# Patient Record
Sex: Female | Born: 1955
Health system: Southern US, Community
[De-identification: ages and names within clinical notes are randomized; demographics above are authoritative.]

## PROBLEM LIST (undated history)

## (undated) DIAGNOSIS — Z8489 Family history of other specified conditions: Secondary | ICD-10-CM

## (undated) DIAGNOSIS — K649 Unspecified hemorrhoids: Secondary | ICD-10-CM

## (undated) DIAGNOSIS — K635 Polyp of colon: Secondary | ICD-10-CM

## (undated) DIAGNOSIS — R011 Cardiac murmur, unspecified: Secondary | ICD-10-CM

## (undated) DIAGNOSIS — M1990 Unspecified osteoarthritis, unspecified site: Secondary | ICD-10-CM

## (undated) DIAGNOSIS — N920 Excessive and frequent menstruation with regular cycle: Secondary | ICD-10-CM

## (undated) DIAGNOSIS — H269 Unspecified cataract: Secondary | ICD-10-CM

## (undated) DIAGNOSIS — Z923 Personal history of irradiation: Secondary | ICD-10-CM

## (undated) DIAGNOSIS — Z9289 Personal history of other medical treatment: Secondary | ICD-10-CM

## (undated) DIAGNOSIS — E785 Hyperlipidemia, unspecified: Secondary | ICD-10-CM

## (undated) DIAGNOSIS — L719 Rosacea, unspecified: Secondary | ICD-10-CM

## (undated) DIAGNOSIS — L732 Hidradenitis suppurativa: Secondary | ICD-10-CM

## (undated) DIAGNOSIS — M479 Spondylosis, unspecified: Secondary | ICD-10-CM

## (undated) DIAGNOSIS — T7840XA Allergy, unspecified, initial encounter: Secondary | ICD-10-CM

## (undated) DIAGNOSIS — D509 Iron deficiency anemia, unspecified: Secondary | ICD-10-CM

## (undated) DIAGNOSIS — F419 Anxiety disorder, unspecified: Secondary | ICD-10-CM

## (undated) DIAGNOSIS — B029 Zoster without complications: Secondary | ICD-10-CM

## (undated) DIAGNOSIS — K08409 Partial loss of teeth, unspecified cause, unspecified class: Secondary | ICD-10-CM

## (undated) DIAGNOSIS — D0510 Intraductal carcinoma in situ of unspecified breast: Secondary | ICD-10-CM

## (undated) HISTORY — DX: Personal history of other medical treatment: Z92.89

## (undated) HISTORY — DX: Polyp of colon: K63.5

## (undated) HISTORY — DX: Unspecified cataract: H26.9

## (undated) HISTORY — DX: Iron deficiency anemia, unspecified: D50.9

## (undated) HISTORY — PX: CATARACT EXTRACTION: SUR2

## (undated) HISTORY — DX: Unspecified osteoarthritis, unspecified site: M19.90

## (undated) HISTORY — DX: Unspecified hemorrhoids: K64.9

## (undated) HISTORY — DX: Hyperlipidemia, unspecified: E78.5

## (undated) HISTORY — PX: JOINT REPLACEMENT: SHX530

## (undated) HISTORY — DX: Spondylosis, unspecified: M47.9

## (undated) HISTORY — DX: Zoster without complications: B02.9

## (undated) HISTORY — DX: Excessive and frequent menstruation with regular cycle: N92.0

## (undated) HISTORY — PX: KNEE SURGERY: SHX244

## (undated) HISTORY — DX: Anxiety disorder, unspecified: F41.9

## (undated) HISTORY — PX: EYE SURGERY: SHX253

## (undated) HISTORY — DX: Allergy, unspecified, initial encounter: T78.40XA

## (undated) HISTORY — DX: Rosacea, unspecified: L71.9

## (undated) HISTORY — PX: COLONOSCOPY: SHX174

## (undated) HISTORY — PX: OTHER SURGICAL HISTORY: SHX169

## (undated) HISTORY — DX: Cardiac murmur, unspecified: R01.1

---

## 1999-06-24 DIAGNOSIS — Z9289 Personal history of other medical treatment: Secondary | ICD-10-CM

## 1999-06-24 HISTORY — DX: Personal history of other medical treatment: Z92.89

## 1999-10-23 ENCOUNTER — Other Ambulatory Visit: Admission: RE | Admit: 1999-10-23 | Discharge: 1999-10-23 | Payer: Self-pay | Admitting: Family Medicine

## 1999-11-07 ENCOUNTER — Encounter: Admission: RE | Admit: 1999-11-07 | Discharge: 1999-11-07 | Payer: Self-pay | Admitting: Family Medicine

## 1999-11-07 ENCOUNTER — Encounter: Payer: Self-pay | Admitting: Family Medicine

## 2000-11-19 ENCOUNTER — Encounter: Payer: Self-pay | Admitting: Family Medicine

## 2000-11-19 ENCOUNTER — Encounter: Admission: RE | Admit: 2000-11-19 | Discharge: 2000-11-19 | Payer: Self-pay | Admitting: Family Medicine

## 2001-01-13 ENCOUNTER — Other Ambulatory Visit: Admission: RE | Admit: 2001-01-13 | Discharge: 2001-01-13 | Payer: Self-pay | Admitting: Family Medicine

## 2001-06-23 DIAGNOSIS — Z9289 Personal history of other medical treatment: Secondary | ICD-10-CM

## 2001-06-23 HISTORY — DX: Personal history of other medical treatment: Z92.89

## 2002-03-23 HISTORY — PX: BREAST CYST ASPIRATION: SHX578

## 2002-04-01 ENCOUNTER — Encounter: Admission: RE | Admit: 2002-04-01 | Discharge: 2002-04-01 | Payer: Self-pay | Admitting: Family Medicine

## 2002-04-01 ENCOUNTER — Encounter: Payer: Self-pay | Admitting: Family Medicine

## 2002-04-15 ENCOUNTER — Other Ambulatory Visit: Admission: RE | Admit: 2002-04-15 | Discharge: 2002-04-15 | Payer: Self-pay | Admitting: Family Medicine

## 2002-11-25 ENCOUNTER — Encounter: Admission: RE | Admit: 2002-11-25 | Discharge: 2002-11-25 | Payer: Self-pay | Admitting: Family Medicine

## 2002-11-25 ENCOUNTER — Encounter: Payer: Self-pay | Admitting: Family Medicine

## 2003-04-18 ENCOUNTER — Other Ambulatory Visit: Admission: RE | Admit: 2003-04-18 | Discharge: 2003-04-18 | Payer: Self-pay | Admitting: Family Medicine

## 2003-04-27 ENCOUNTER — Encounter: Admission: RE | Admit: 2003-04-27 | Discharge: 2003-04-27 | Payer: Self-pay | Admitting: Family Medicine

## 2004-03-23 DIAGNOSIS — Z9289 Personal history of other medical treatment: Secondary | ICD-10-CM

## 2004-03-23 HISTORY — DX: Personal history of other medical treatment: Z92.89

## 2004-03-23 HISTORY — PX: BREAST CYST ASPIRATION: SHX578

## 2004-11-07 ENCOUNTER — Ambulatory Visit: Payer: Self-pay | Admitting: Family Medicine

## 2004-12-30 ENCOUNTER — Encounter: Admission: RE | Admit: 2004-12-30 | Discharge: 2004-12-30 | Payer: Self-pay | Admitting: Family Medicine

## 2005-02-03 ENCOUNTER — Ambulatory Visit: Payer: Self-pay | Admitting: Family Medicine

## 2005-02-03 ENCOUNTER — Other Ambulatory Visit: Admission: RE | Admit: 2005-02-03 | Discharge: 2005-02-03 | Payer: Self-pay | Admitting: Family Medicine

## 2005-02-05 ENCOUNTER — Encounter: Admission: RE | Admit: 2005-02-05 | Discharge: 2005-02-05 | Payer: Self-pay | Admitting: Family Medicine

## 2006-06-19 ENCOUNTER — Ambulatory Visit: Payer: Self-pay | Admitting: Family Medicine

## 2006-07-14 ENCOUNTER — Encounter: Admission: RE | Admit: 2006-07-14 | Discharge: 2006-07-14 | Payer: Self-pay | Admitting: Family Medicine

## 2006-07-14 ENCOUNTER — Ambulatory Visit: Payer: Self-pay | Admitting: Family Medicine

## 2006-07-14 ENCOUNTER — Other Ambulatory Visit: Admission: RE | Admit: 2006-07-14 | Discharge: 2006-07-14 | Payer: Self-pay | Admitting: Family Medicine

## 2006-07-14 ENCOUNTER — Encounter: Payer: Self-pay | Admitting: Family Medicine

## 2006-07-14 LAB — CONVERTED CEMR LAB
ALT: 14 units/L (ref 0–40)
AST: 19 units/L (ref 0–37)
Albumin: 3.7 g/dL (ref 3.5–5.2)
BUN: 14 mg/dL (ref 6–23)
Basophils Absolute: 0.1 10*3/uL (ref 0.0–0.1)
Basophils Relative: 1.4 % — ABNORMAL HIGH (ref 0.0–1.0)
CO2: 28 meq/L (ref 19–32)
Calcium: 9.1 mg/dL (ref 8.4–10.5)
Chloride: 106 meq/L (ref 96–112)
Cholesterol: 186 mg/dL (ref 0–200)
Creatinine, Ser: 1 mg/dL (ref 0.4–1.2)
Eosinophils Relative: 3.2 % (ref 0.0–5.0)
GFR calc Af Amer: 75 mL/min
GFR calc non Af Amer: 62 mL/min
Glucose, Bld: 70 mg/dL (ref 70–99)
HCT: 39 % (ref 36.0–46.0)
HDL: 50 mg/dL (ref >39.0)
Hemoglobin: 13.8 g/dL (ref 12.0–15.0)
LDL Cholesterol: 115 mg/dL — ABNORMAL HIGH (ref 0–99)
Lymphocytes Relative: 23.4 % (ref 12.0–46.0)
MCHC: 35.3 g/dL (ref 30.0–36.0)
MCV: 87.7 fL (ref 78.0–100.0)
Monocytes Absolute: 0.4 10*3/uL (ref 0.2–0.7)
Monocytes Relative: 7.2 % (ref 3.0–11.0)
Neutro Abs: 4 10*3/uL (ref 1.4–7.7)
Neutrophils Relative %: 64.8 % (ref 43.0–77.0)
Pap Smear: NORMAL
Phosphorus: 3.2 mg/dL (ref 2.3–4.6)
Platelets: 209 10*3/uL (ref 150–400)
Potassium: 3.7 meq/L (ref 3.5–5.1)
RBC: 4.44 M/uL (ref 3.87–5.11)
RDW: 13.1 % (ref 11.5–14.6)
Sodium: 140 meq/L (ref 135–145)
TSH: 2.45 microintl units/mL (ref 0.35–5.50)
Total CHOL/HDL Ratio: 3.7
Triglycerides: 106 mg/dL (ref 0–149)
VLDL: 21 mg/dL (ref 0–40)
WBC: 6.1 10*3/uL (ref 4.5–10.5)

## 2006-11-25 ENCOUNTER — Ambulatory Visit: Payer: Self-pay | Admitting: Internal Medicine

## 2006-12-10 ENCOUNTER — Encounter: Payer: Self-pay | Admitting: Internal Medicine

## 2006-12-10 ENCOUNTER — Encounter: Payer: Self-pay | Admitting: Family Medicine

## 2006-12-10 ENCOUNTER — Ambulatory Visit: Payer: Self-pay | Admitting: Internal Medicine

## 2006-12-10 LAB — HM COLONOSCOPY

## 2007-04-11 ENCOUNTER — Encounter: Payer: Self-pay | Admitting: Family Medicine

## 2007-05-24 ENCOUNTER — Encounter: Payer: Self-pay | Admitting: Family Medicine

## 2007-05-24 DIAGNOSIS — L719 Rosacea, unspecified: Secondary | ICD-10-CM

## 2007-05-24 DIAGNOSIS — F411 Generalized anxiety disorder: Secondary | ICD-10-CM | POA: Insufficient documentation

## 2007-05-24 DIAGNOSIS — D509 Iron deficiency anemia, unspecified: Secondary | ICD-10-CM

## 2007-05-24 DIAGNOSIS — N946 Dysmenorrhea, unspecified: Secondary | ICD-10-CM

## 2007-05-24 DIAGNOSIS — J309 Allergic rhinitis, unspecified: Secondary | ICD-10-CM | POA: Insufficient documentation

## 2007-05-24 DIAGNOSIS — R011 Cardiac murmur, unspecified: Secondary | ICD-10-CM | POA: Insufficient documentation

## 2007-05-25 ENCOUNTER — Ambulatory Visit: Payer: Self-pay | Admitting: Family Medicine

## 2007-06-29 ENCOUNTER — Telehealth: Payer: Self-pay | Admitting: Family Medicine

## 2007-07-02 ENCOUNTER — Encounter: Payer: Self-pay | Admitting: Family Medicine

## 2007-07-15 ENCOUNTER — Encounter: Payer: Self-pay | Admitting: Family Medicine

## 2007-08-05 ENCOUNTER — Ambulatory Visit: Payer: Self-pay | Admitting: Specialist

## 2007-09-24 ENCOUNTER — Ambulatory Visit: Payer: Self-pay | Admitting: Specialist

## 2007-09-28 ENCOUNTER — Telehealth: Payer: Self-pay | Admitting: Family Medicine

## 2007-10-20 ENCOUNTER — Ambulatory Visit: Payer: Self-pay | Admitting: Internal Medicine

## 2008-01-07 ENCOUNTER — Other Ambulatory Visit: Admission: RE | Admit: 2008-01-07 | Discharge: 2008-01-07 | Payer: Self-pay | Admitting: Family Medicine

## 2008-01-07 ENCOUNTER — Encounter: Payer: Self-pay | Admitting: Family Medicine

## 2008-01-07 ENCOUNTER — Ambulatory Visit: Payer: Self-pay | Admitting: Family Medicine

## 2008-01-07 LAB — CONVERTED CEMR LAB: Pap Smear: NORMAL

## 2008-01-10 ENCOUNTER — Encounter: Payer: Self-pay | Admitting: Family Medicine

## 2008-01-10 LAB — CONVERTED CEMR LAB
ALT: 13 units/L (ref 0–35)
AST: 20 units/L (ref 0–37)
Albumin: 3.7 g/dL (ref 3.5–5.2)
Alkaline Phosphatase: 51 units/L (ref 39–117)
BUN: 21 mg/dL (ref 6–23)
Basophils Absolute: 0 10*3/uL (ref 0.0–0.1)
Basophils Relative: 0.6 % (ref 0.0–3.0)
Bilirubin, Direct: 0.1 mg/dL (ref 0.0–0.3)
CO2: 27 meq/L (ref 19–32)
Calcium: 9.3 mg/dL (ref 8.4–10.5)
Chloride: 106 meq/L (ref 96–112)
Cholesterol: 184 mg/dL (ref 0–200)
Creatinine, Ser: 0.9 mg/dL (ref 0.4–1.2)
Eosinophils Absolute: 0.2 10*3/uL (ref 0.0–0.7)
Eosinophils Relative: 3.6 % (ref 0.0–5.0)
GFR calc Af Amer: 85 mL/min
GFR calc non Af Amer: 70 mL/min
Glucose, Bld: 95 mg/dL (ref 70–99)
HCT: 38.3 % (ref 36.0–46.0)
HDL: 48.5 mg/dL (ref >39.0)
Hemoglobin: 13.3 g/dL (ref 12.0–15.0)
LDL Cholesterol: 125 mg/dL — ABNORMAL HIGH (ref 0–99)
Lymphocytes Relative: 27.9 % (ref 12.0–46.0)
MCHC: 34.7 g/dL (ref 30.0–36.0)
MCV: 85.7 fL (ref 78.0–100.0)
Monocytes Absolute: 0.4 10*3/uL (ref 0.1–1.0)
Monocytes Relative: 7.1 % (ref 3.0–12.0)
Neutro Abs: 3.3 10*3/uL (ref 1.4–7.7)
Neutrophils Relative %: 60.8 % (ref 43.0–77.0)
Platelets: 199 10*3/uL (ref 150–400)
Potassium: 4.5 meq/L (ref 3.5–5.1)
RBC: 4.47 M/uL (ref 3.87–5.11)
RDW: 12.8 % (ref 11.5–14.6)
Sodium: 137 meq/L (ref 135–145)
TSH: 2.2 microintl units/mL (ref 0.35–5.50)
Total Bilirubin: 1.1 mg/dL (ref 0.3–1.2)
Total CHOL/HDL Ratio: 3.8
Total Protein: 6.6 g/dL (ref 6.0–8.3)
Triglycerides: 53 mg/dL (ref 0–149)
VLDL: 11 mg/dL (ref 0–40)
WBC: 5.4 10*3/uL (ref 4.5–10.5)

## 2008-01-17 ENCOUNTER — Encounter: Admission: RE | Admit: 2008-01-17 | Discharge: 2008-01-17 | Payer: Self-pay | Admitting: Family Medicine

## 2008-02-03 ENCOUNTER — Ambulatory Visit: Payer: Self-pay | Admitting: Obstetrics & Gynecology

## 2008-03-01 ENCOUNTER — Ambulatory Visit (HOSPITAL_COMMUNITY): Admission: RE | Admit: 2008-03-01 | Discharge: 2008-03-01 | Payer: Self-pay | Admitting: Gynecology

## 2008-06-08 ENCOUNTER — Ambulatory Visit: Payer: Self-pay | Admitting: Family Medicine

## 2008-06-08 LAB — CONVERTED CEMR LAB
KOH Prep: NEGATIVE
Whiff Test: NEGATIVE

## 2008-08-24 ENCOUNTER — Ambulatory Visit: Payer: Self-pay | Admitting: Specialist

## 2008-10-13 ENCOUNTER — Ambulatory Visit: Payer: Self-pay | Admitting: Family Medicine

## 2008-10-13 DIAGNOSIS — IMO0002 Reserved for concepts with insufficient information to code with codable children: Secondary | ICD-10-CM

## 2009-02-12 ENCOUNTER — Telehealth: Payer: Self-pay | Admitting: Family Medicine

## 2009-02-28 ENCOUNTER — Ambulatory Visit: Payer: Self-pay | Admitting: Family Medicine

## 2009-03-05 ENCOUNTER — Telehealth: Payer: Self-pay | Admitting: Family Medicine

## 2009-05-02 ENCOUNTER — Ambulatory Visit: Payer: Self-pay | Admitting: Family Medicine

## 2009-06-20 ENCOUNTER — Ambulatory Visit: Payer: Self-pay | Admitting: Family Medicine

## 2009-06-20 DIAGNOSIS — M5020 Other cervical disc displacement, unspecified cervical region: Secondary | ICD-10-CM | POA: Insufficient documentation

## 2009-06-26 ENCOUNTER — Emergency Department: Payer: Self-pay | Admitting: Emergency Medicine

## 2009-06-26 ENCOUNTER — Encounter: Payer: Self-pay | Admitting: Family Medicine

## 2009-07-05 ENCOUNTER — Ambulatory Visit: Payer: Self-pay | Admitting: Family Medicine

## 2009-07-05 DIAGNOSIS — F0781 Postconcussional syndrome: Secondary | ICD-10-CM

## 2009-07-06 ENCOUNTER — Ambulatory Visit: Payer: Self-pay | Admitting: Internal Medicine

## 2009-07-06 ENCOUNTER — Encounter: Admission: RE | Admit: 2009-07-06 | Discharge: 2009-07-06 | Payer: Self-pay | Admitting: Family Medicine

## 2009-07-06 LAB — HM MAMMOGRAPHY: HM Mammogram: NEGATIVE

## 2009-07-09 ENCOUNTER — Telehealth (INDEPENDENT_AMBULATORY_CARE_PROVIDER_SITE_OTHER): Payer: Self-pay | Admitting: *Deleted

## 2009-07-09 ENCOUNTER — Telehealth: Payer: Self-pay | Admitting: Family Medicine

## 2009-07-10 ENCOUNTER — Encounter (INDEPENDENT_AMBULATORY_CARE_PROVIDER_SITE_OTHER): Payer: Self-pay | Admitting: *Deleted

## 2009-07-17 ENCOUNTER — Encounter: Payer: Self-pay | Admitting: Family Medicine

## 2009-07-18 ENCOUNTER — Ambulatory Visit: Payer: Self-pay | Admitting: Family Medicine

## 2009-07-24 ENCOUNTER — Telehealth: Payer: Self-pay | Admitting: Family Medicine

## 2009-07-24 ENCOUNTER — Ambulatory Visit: Payer: Self-pay | Admitting: Otolaryngology

## 2009-07-25 ENCOUNTER — Encounter: Payer: Self-pay | Admitting: Family Medicine

## 2009-07-29 ENCOUNTER — Ambulatory Visit: Payer: Self-pay | Admitting: Family Medicine

## 2009-07-29 ENCOUNTER — Encounter: Payer: Self-pay | Admitting: Family Medicine

## 2009-08-03 ENCOUNTER — Other Ambulatory Visit: Admission: RE | Admit: 2009-08-03 | Discharge: 2009-08-03 | Payer: Self-pay | Admitting: Family Medicine

## 2009-08-03 ENCOUNTER — Encounter (INDEPENDENT_AMBULATORY_CARE_PROVIDER_SITE_OTHER): Payer: Self-pay | Admitting: *Deleted

## 2009-08-03 ENCOUNTER — Ambulatory Visit: Payer: Self-pay | Admitting: Family Medicine

## 2009-08-03 DIAGNOSIS — Z860100 Personal history of colon polyps, unspecified: Secondary | ICD-10-CM | POA: Insufficient documentation

## 2009-08-08 ENCOUNTER — Encounter: Payer: Self-pay | Admitting: Family Medicine

## 2009-08-08 ENCOUNTER — Encounter (INDEPENDENT_AMBULATORY_CARE_PROVIDER_SITE_OTHER): Payer: Self-pay | Admitting: *Deleted

## 2009-08-08 LAB — CONVERTED CEMR LAB
Pap Smear: NEGATIVE
Pap Smear: NORMAL

## 2009-08-09 ENCOUNTER — Encounter (INDEPENDENT_AMBULATORY_CARE_PROVIDER_SITE_OTHER): Payer: Self-pay | Admitting: *Deleted

## 2009-08-24 ENCOUNTER — Encounter: Payer: Self-pay | Admitting: Otolaryngology

## 2009-08-30 ENCOUNTER — Telehealth: Payer: Self-pay | Admitting: Family Medicine

## 2009-08-30 ENCOUNTER — Ambulatory Visit: Payer: Self-pay | Admitting: Internal Medicine

## 2009-09-06 ENCOUNTER — Ambulatory Visit: Payer: Self-pay | Admitting: Family Medicine

## 2009-09-13 ENCOUNTER — Encounter: Payer: Self-pay | Admitting: Family Medicine

## 2009-09-21 LAB — CONVERTED CEMR LAB
ALT: 15 units/L (ref 0–35)
AST: 20 units/L (ref 0–37)
Albumin: 3.8 g/dL (ref 3.5–5.2)
Alkaline Phosphatase: 63 units/L (ref 39–117)
BUN: 13 mg/dL (ref 6–23)
Basophils Absolute: 0 10*3/uL (ref 0.0–0.1)
Basophils Relative: 0.6 % (ref 0.0–3.0)
Bilirubin, Direct: 0.1 mg/dL (ref 0.0–0.3)
CO2: 30 meq/L (ref 19–32)
Calcium: 9.1 mg/dL (ref 8.4–10.5)
Chloride: 100 meq/L (ref 96–112)
Cholesterol: 227 mg/dL — ABNORMAL HIGH (ref 0–200)
Creatinine, Ser: 0.7 mg/dL (ref 0.4–1.2)
Direct LDL: 152.4 mg/dL
Eosinophils Absolute: 0.2 10*3/uL (ref 0.0–0.7)
Eosinophils Relative: 3.3 % (ref 0.0–5.0)
GFR calc non Af Amer: 93.01 mL/min (ref >60)
Glucose, Bld: 83 mg/dL (ref 70–99)
HCT: 40.7 % (ref 36.0–46.0)
HDL: 60.9 mg/dL (ref >39.00)
Hemoglobin: 13.6 g/dL (ref 12.0–15.0)
Lymphocytes Relative: 22.7 % (ref 12.0–46.0)
Lymphs Abs: 1.5 10*3/uL (ref 0.7–4.0)
MCHC: 33.3 g/dL (ref 30.0–36.0)
MCV: 88.3 fL (ref 78.0–100.0)
Monocytes Absolute: 0.4 10*3/uL (ref 0.1–1.0)
Monocytes Relative: 5.7 % (ref 3.0–12.0)
Neutro Abs: 4.6 10*3/uL (ref 1.4–7.7)
Neutrophils Relative %: 67.7 % (ref 43.0–77.0)
Platelets: 202 10*3/uL (ref 150.0–400.0)
Potassium: 4.2 meq/L (ref 3.5–5.1)
RBC: 4.61 M/uL (ref 3.87–5.11)
RDW: 13.8 % (ref 11.5–14.6)
Sodium: 137 meq/L (ref 135–145)
TSH: 2.15 microintl units/mL (ref 0.35–5.50)
Total Bilirubin: 0.8 mg/dL (ref 0.3–1.2)
Total CHOL/HDL Ratio: 4
Total Protein: 7 g/dL (ref 6.0–8.3)
Triglycerides: 123 mg/dL (ref 0.0–149.0)
VLDL: 24.6 mg/dL (ref 0.0–40.0)
WBC: 6.7 10*3/uL (ref 4.5–10.5)

## 2009-10-02 ENCOUNTER — Telehealth: Payer: Self-pay | Admitting: Internal Medicine

## 2009-12-11 ENCOUNTER — Ambulatory Visit: Payer: Self-pay | Admitting: Family Medicine

## 2009-12-11 DIAGNOSIS — E7800 Pure hypercholesterolemia, unspecified: Secondary | ICD-10-CM | POA: Insufficient documentation

## 2009-12-12 LAB — CONVERTED CEMR LAB
ALT: 14 units/L (ref 0–35)
AST: 19 units/L (ref 0–37)
Cholesterol: 220 mg/dL — ABNORMAL HIGH (ref 0–200)
Direct LDL: 150.4 mg/dL
HDL: 65.9 mg/dL (ref >39.00)
Total CHOL/HDL Ratio: 3
Triglycerides: 60 mg/dL (ref 0.0–149.0)
VLDL: 12 mg/dL (ref 0.0–40.0)

## 2009-12-18 ENCOUNTER — Ambulatory Visit: Payer: Self-pay | Admitting: Family Medicine

## 2009-12-31 ENCOUNTER — Telehealth (INDEPENDENT_AMBULATORY_CARE_PROVIDER_SITE_OTHER): Payer: Self-pay | Admitting: *Deleted

## 2010-01-25 ENCOUNTER — Ambulatory Visit: Payer: Self-pay | Admitting: Family Medicine

## 2010-02-04 ENCOUNTER — Ambulatory Visit: Payer: Self-pay | Admitting: Family Medicine

## 2010-02-04 ENCOUNTER — Encounter: Payer: Self-pay | Admitting: Family Medicine

## 2010-02-18 ENCOUNTER — Telehealth: Payer: Self-pay | Admitting: Family Medicine

## 2010-03-20 ENCOUNTER — Ambulatory Visit: Payer: Self-pay | Admitting: Family Medicine

## 2010-03-25 LAB — CONVERTED CEMR LAB
ALT: 20 units/L (ref 0–35)
AST: 20 units/L (ref 0–37)
Cholesterol: 221 mg/dL — ABNORMAL HIGH (ref 0–200)
Direct LDL: 150.6 mg/dL
HDL: 60.5 mg/dL (ref >39.00)
Total CHOL/HDL Ratio: 4
Triglycerides: 69 mg/dL (ref 0.0–149.0)
VLDL: 13.8 mg/dL (ref 0.0–40.0)

## 2010-04-02 ENCOUNTER — Ambulatory Visit: Payer: Self-pay | Admitting: Family Medicine

## 2010-06-27 ENCOUNTER — Ambulatory Visit: Admit: 2010-06-27 | Payer: Self-pay | Admitting: Family Medicine

## 2010-06-27 ENCOUNTER — Ambulatory Visit
Admission: RE | Admit: 2010-06-27 | Discharge: 2010-06-27 | Payer: Self-pay | Source: Home / Self Care | Attending: Family Medicine | Admitting: Family Medicine

## 2010-07-03 ENCOUNTER — Other Ambulatory Visit: Payer: Self-pay | Admitting: Family Medicine

## 2010-07-03 ENCOUNTER — Ambulatory Visit
Admission: RE | Admit: 2010-07-03 | Discharge: 2010-07-03 | Payer: Self-pay | Source: Home / Self Care | Attending: Family Medicine | Admitting: Family Medicine

## 2010-07-03 LAB — LIPID PANEL
Cholesterol: 150 mg/dL (ref 0–200)
HDL: 55.9 mg/dL (ref >39.00)
LDL Cholesterol: 86 mg/dL (ref 0–99)
Total CHOL/HDL Ratio: 3
Triglycerides: 41 mg/dL (ref 0.0–149.0)
VLDL: 8.2 mg/dL (ref 0.0–40.0)

## 2010-07-03 LAB — ALT: ALT: 14 U/L (ref 0–35)

## 2010-07-03 LAB — AST: AST: 19 U/L (ref 0–37)

## 2010-07-23 NOTE — Progress Notes (Signed)
Summary: follow up   Phone Note Call from Patient Call back at Home Phone [phone]   Caller: Patient Call For: Judith Part MD Summary of Call: Patient says that she is scheduled to go back to work on February 7th and she needs a clearance letter. She has a follow up with Dr. Milinda Antis next week.  She would like to come by and pick that up.   Follow-up for Phone Call        Patient is scheduled to see you on the 11th and her form that DR. Copland filled out for her says she is to return back to work on the 7th. She needs a note faxed to her work to excuse her until her follow up with you for clearance to return back to work. It needs to be faxed to [phone] attention Ivan Croft.   I am fine with her returning on the 7th if she wants to (before she sees me on the 11th)-- let me know what she wants to do-- MT  Follow-up by: Melody Comas,  July 24, 2009 3:38 PM  Additional Follow-up for Phone Call Additional follow up Details #1::        Called home number and work number that's listed, no answer and no machine at either number.  Will call  back tomorrow.  Linde Gillis CMA Duncan Dull)  July 24, 2009 4:46 PM   Pt actually wants to go back on 2/14 and she is asking if you will extend her out of work time until then.  She has an appt to see you on 2/11. Additional Follow-up by: Lowella Petties CMA,  July 25, 2009 1:23 PM    Additional Follow-up for Phone Call Additional follow up Details #2::    note done in in box Follow-up by: Judith Part MD,  July 25, 2009 1:45 PM  Additional Follow-up for Phone Call Additional follow up Details #3:: Details for Additional Follow-up Action Taken: Advised pt that note is ready, she will pick up. Additional Follow-up by: Lowella Petties CMA,  July 25, 2009 2:16 PM

## 2010-07-23 NOTE — Progress Notes (Signed)
Summary: call to patient re: hemorrhoids  Phone Note Outgoing Call   Summary of Call: Please contact her for an update re: hemorrhoid signs and symptoms (see last office note) Iva Boop MD, [facility]  October 02, 2009 3:16 PM   Follow-up for Phone Call        Left message for patient to call back Darcey Nora RN, [facility]  October 02, 2009 3:36 PM Left message for patient to call back Darcey Nora RN, [facility]  October 03, 2009 11:29 AM  Left message for patient to call back Darcey Nora RN, [facility]  October 04, 2009 7:38 AM  no return call from the patient  Darcey Nora RN, [facility]  October 05, 2009 8:39 AM
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
## 2010-07-23 NOTE — Assessment & Plan Note (Signed)
Summary: 1 MONTH FOLLOW UP PER DR TOWER/[facility]   Vital Signs:  Patient profile:   55 year old female Height:      68 inches Weight:      193.4 pounds BMI:     29.51 Temp:     98.3 degrees F oral Pulse rate:   84 / minute Pulse rhythm:   regular BP sitting:   110 / 70  (left arm) Cuff size:   regular  Vitals Entered By: Benny Lennert CMA Duncan Dull) (September 06, 2009 4:04 PM)  History of Present Illness: Chief complaint 1 month follow up per tower  55 year old female:  radiculopathy:  neck is still a little sore, no radiculopathy  Back on some mobic.   Has been doing some vestibular rehab [facility] at [facility] - and work on balance and proprioception. Working on her walking. Balance and  [facility]:  Not compliant fully with PT. A couple of weeks ago - and went last week.   Occ. will bring on dizzy spells  Allergies (verified): No Known Drug Allergies  Past History:  Past medical, surgical, family and social histories (including risk factors) reviewed, and no changes noted (except as noted below).  Past Medical History: Reviewed history from 08/30/2009 and no changes required. Allergic rhinitis Anemia-iron deficiency Anxiety heavy menses  rosacea zoster  Arthritis Asthma Colon polyps   Past Surgical History: Reviewed history from 08/30/2009 and no changes required. Caesarean section Left Knee Surgery  Exercise stress test- neg (06/1999) MRI- neg per pt (2003) Breast cyst aspiration (03/2002) Left breast cyst aspirate (11/2002, 12/2004) C-S MRI, C5-6 disk protrusion (03/2004) MRI- right quad fatty defect (2007) Colonoscopy- polyps, hemorrhoids (11/2006)  Family History: Reviewed history from 08/30/2009 and no changes required. Father: deceased age 98- CAD, ETOH, DM  Mother: obesity, OA , thyroid problems  Siblings:  GM with DM sibs with back problems No FH of Colon Cancer:  Social History: Reviewed history from 08/30/2009 and no changes required. Marital  Status: Married Children: 1 daughter Occupation: Film/video editor schools non smoker Alcohol Use - yes: less than one daily  Daily Caffeine Use1-2 daily   Review of Systems      See HPI Eyes:  Complains of blurring. GI:  Complains of nausea. Neuro:  Complains of headaches.  Physical Exam  General:  Well-developed,well-nourished,in no acute distress; alert,appropriate and cooperative throughout examination Head:  Normocephalic and atraumatic without obvious abnormalities. No apparent alopecia or balding. Eyes:  pupils equal, pupils round, pupils reactive to light, and pupils react to accomodation.   Ears:  no external deformities.   Neck:  No deformities, masses, or tenderness noted. Psych:  Cognition and judgment appear intact. Alert and cooperative with normal attention span and concentration. No apparent delusions, illusions, hallucinations   Detailed Neurologic Exam  Speech:    Speech is normal; fluent and spontaneous with normal comprehension Cognition:    The patient is oriented to person, place, and time; memory intact; language fluent; normal attention, concentration, and fund of knowledge Cranial Nerves:    The pupils are equal, round, and reactive to light. The fundi are normal and spontaneous venous pulsations are present. Visual fields are full to finger confrontation. Extraocular movements are intact. Trigeminal sensation is intact and the muscles of mastication are normal. The face is symmetric. The palate elevates in the midline. Voice is normal. Shoulder shrug is normal. The tongue has normal motion without fasciculations.  Coordination:    poor balance with standing on one foot, one foot  in front of the other.  Gait:    Heel-toe and tandem gait are normal.    Impression & Recommendations:  Problem # 1:  DIZZINESS (ICD-780.4) >25 minutes spent in face to face time with patient, >50% spent in counselling or coordination of care: complex case,  the patient has had an  extensive workup,, including imaging of the  cervical spine and lumbar spine  by  MRI, with findings noted,, however this would not relate to her  dizzy sensations and she is continued HA.  She has had CT scans of the brain,, and an MRI of the brain, which showed no abnormalities. Additionally, she is seeing ENT, and had normal tilt table testing.. This would argue against any inner ear process, and Dr. Andee Poles does not think that this is coming from her inner ear. With a normal MRI, I think that a lesion or tumor,  multiple sclerosis, and other pathology such as this  can be excluded.  The patient has a neurology appointment next week, and I think that that is a reasonable step to get their opinion.  Certainly, you can have symptoms postconcussion for greater than 2 months, though it is  less common.  Most likely post-concussive syndrome, but I would like to hear Dr. Zannie Cove opinion as well.  Her updated medication list for this problem includes:    Cetirizine Hcl 10 Mg Tabs (Cetirizine hcl) ..... 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 One tablet daily as needed  Problem # 4:  HERNIATED CERVICAL DISC (ICD-722.0) Assessment: Improved much better  Complete Medication List: 1)  Buspar 15 Mg Tabs (Buspirone hcl) .... One half pill at bedtime 2)  Meloxicam 15 Mg Tabs (Meloxicam) .... One tablet daily as needed 3)  Finacea 15 % Gel (Azelaic acid) .... Topical cream for rosacea on face 4)  Cetirizine Hcl 10 Mg Tabs (Cetirizine hcl) .... One tablet daily as needed 5)  Vitamin D3 400 Unit Chew (Cholecalciferol) .... Chew two daily 6)  Cosamin Ds 500-400 Mg Caps (Glucosamine-chondroitin) .... Two  capsules daily 7)  Fish Oil 1200 Mg Caps (Omega-3 fatty acids) .... Take two capsules daily 8)  Magnesium Oxide 400 Mg Tabs (Magnesium oxide) .... One softgel daily 9)  Multivitamins Tabs (Multiple vitamin) .... One daily 10)  Eql Hemorrhoidal 0.25-3-12-18 % Crea (Phenyleph-shark liv o-glyc-pet) .... As directed 11)  Anusol-hc 25 Mg Supp (Hydrocortisone acetate) .... Insert one suppository into rectum for  7 nights then as needed  Patient Instructions: 1)  discuss all with Dr. Terrace Arabia, review MRI's, review concussive symptoms starting in January  Current Allergies (reviewed today): No known allergies

## 2010-07-23 NOTE — Progress Notes (Signed)
Summary: Buspirone HCL 15mg   Phone Note Refill Request Call back at [phone] Message from:  [facility] on February 18, 2010 12:53 PM  Refills Requested: Medication #1:  BUSPAR 15 MG  TABS one half pill at bedtime [facility] electronically request refill for Buspirone HCL 15mg . No date for last refill.Please advise.    Method Requested: Telephone to Pharmacy Initial call taken by: Lewanda Rife LPN,  February 18, 2010 12:54 PM  Follow-up for Phone Call        px written on EMR for call in  Follow-up by: Judith Part MD,  February 18, 2010 1:05 PM  Additional Follow-up for Phone Call Additional follow up Details #1::        Medication phoned [facility] pharmacy as instructed. Lewanda Rife LPN  February 18, 2010 2:15 PM     New/Updated Medications: BUSPAR 15 MG  TABS (BUSPIRONE HCL) one half pill at bedtime Prescriptions: BUSPAR 15 MG  TABS (BUSPIRONE HCL) one half pill at bedtime  #15 x 11   Entered and Authorized by:   Judith Part MD   Signed by:   Lewanda Rife LPN on 45/40/9811   Method used:   [facility] ...       [facility]  [id number]       [address]       [place], [place]  [address]       Ph: [phone]       Fax: [phone]   RxID:   [id number]

## 2010-07-23 NOTE — Progress Notes (Signed)
  Request Recieved from [employer] for records sent to [facility]  December 31, 2009 8:26 AM

## 2010-07-23 NOTE — Letter (Signed)
Summary: Out of Work  [employer] at [facility]  [address] [place], [place] [address]   Phone: [phone]  Fax: [phone]    July 05, 2009   Employee:  AADHYA BUSTAMANTE    To Whom It May Concern:   For Medical reasons, please excuse the above named employee from work for the following dates:  Start:   07/05/2009  End:   07/12/2009  If you need additional information, please feel free to contact our office.         Sincerely,    Hannah Beat MD

## 2010-07-23 NOTE — Assessment & Plan Note (Signed)
Summary: FOLLOW UP AFTER LABS/RI   Vital Signs:  Patient profile:   55 year old female Height:      68 inches Weight:      199.25 pounds BMI:     30.41 Temp:     98 degrees F oral Pulse rate:   68 / minute Pulse rhythm:   regular BP sitting:   118 / 80  (left arm) Cuff size:   regular  Vitals Entered By: Lewanda Rife LPN (April 02, 2010 4:30 PM) CC: f/u after labs   History of Present Illness: here for f/u of high chol  after better diet LDL stil in 150s hdl good at 60  diet - is healthy  has cut down cheese and eats the low fat variety low to non fat yogurt no red meat at all  almost never fried foods eggs -- with yolks 6 per week most  no shellfish  [place] bacon if any   is ok with taking med if needed for cholesterol  took side eff from red yeast rice     Allergies (verified): No Known Drug Allergies  Past History:  Past Medical History: Last updated: 01/26/2010 Allergic rhinitis Anemia-iron deficiency Anxiety heavy menses  rosacea zoster  Arthritis Asthma Colon polyps  deg lumbar disc dz/ spondylosis  Past Surgical History: Last updated: 08/30/2009 Caesarean section Left Knee Surgery  Exercise stress test- neg (06/1999) MRI- neg per pt (2003) Breast cyst aspiration (03/2002) Left breast cyst aspirate (11/2002, 12/2004) C-S MRI, C5-6 disk protrusion (03/2004) MRI- right quad fatty defect (2007) Colonoscopy- polyps, hemorrhoids (11/2006)  Family History: Last updated: 12-31-09 Father: deceased age 42- CAD, ETOH, DM  Mother: obesity, OA , thyroid problems  sister - high chol GM with DM sibs with back problems No FH of Colon Cancer:  Social History: Last updated: 08/30/2009 Marital Status: Married Children: 1 daughter Occupation: Film/video editor schools non smoker Alcohol Use - yes: less than one daily  Daily Caffeine Use1-2 daily   Risk Factors: Smoking Status: never (05/24/2007)  Review of Systems General:  Denies fatigue, loss  of appetite, and malaise. Eyes:  Denies blurring and eye irritation. CV:  Denies chest pain or discomfort, lightheadness, and palpitations. Resp:  Denies cough and wheezing. GI:  Denies abdominal pain, change in bowel habits, and indigestion. MS:  Denies muscle aches and cramps. Derm:  Denies lesion(s), poor wound healing, and rash. Neuro:  Denies numbness and tingling. Heme:  Denies abnormal bruising and bleeding.  Physical Exam  General:  Well-developed,well-nourished,in no acute distress; alert,appropriate and cooperative throughout examination Head:  normocephalic, atraumatic, and no abnormalities observed.   Mouth:  pharynx pink and moist.   Neck:  supple with full rom and no masses or thyromegally, no JVD or carotid bruit  Lungs:  Normal respiratory effort, chest expands symmetrically. Lungs are clear to auscultation, no crackles or wheezes. Heart:  Normal rate and regular rhythm. S1 and S2 normal without gallop, murmur, click, rub or other extra sounds. no murmur heard today Abdomen:  Bowel sounds positive,abdomen soft and non-tender without masses, organomegaly or hernias noted. no renal bruits  Msk:  No deformity or scoliosis noted of thoracic or lumbar spine.  no acute joint changes  Extremities:  No clubbing, cyanosis, edema, or deformity noted with normal full range of motion of all joints.   Neurologic:  sensation intact to light touch, gait normal, and DTRs symmetrical and normal.   Skin:  Intact without suspicious lesions or rashes Cervical Nodes:  No lymphadenopathy  noted Psych:  normal affect, talkative and pleasant    Impression & Recommendations:  Problem # 1:  HYPERCHOLESTEROLEMIA (ICD-272.0) Assessment Deteriorated  not imp on nearly perfect diet expect this is genetic  will try zocor low dose- disc poss side eff check labs 6 wk  Her updated medication list for this problem includes:    Zocor 20 Mg Tabs (Simvastatin) .Marland Kitchen... 1 by mouth once daily in evening  with a low fat snack  Orders: Prescription Created Electronically [phone])  Complete Medication List: 1)  Buspar 15 Mg Tabs (Buspirone hcl) .... One half pill at bedtime 2)  Meloxicam 15 Mg Tabs (Meloxicam) .... One tablet daily as needed with food 3)  Finacea 15 % Gel (Azelaic acid) .... Topical cream for rosacea on face 4)  Cetirizine Hcl 10 Mg Tabs (Cetirizine hcl) .... One tablet daily as needed 5)  Vitamin D3 400 Unit Chew (Cholecalciferol) .... Chew two daily 6)  Fish Oil 1200 Mg Caps (Omega-3 fatty acids) .... Take two capsules daily 7)  Magnesium Oxide 400 Mg Tabs (Magnesium oxide) .... One softgel daily 8)  Multivitamins Tabs (Multiple vitamin) .... One daily 9)  Anusol-hc 25 Mg Supp (Hydrocortisone acetate) .... Insert one suppository into rectum for  7 nights then as needed 10)  Advil 200 Mg Tabs (Ibuprofen) .... Otc as directed. 11)  Tizanidine Hcl 4 Mg Tabs (Tizanidine hcl) .Marland Kitchen.. 1 by mouth at bedtime as needed muscle spasm 12)  Naproxen ?mg Otc  .... One tablet by mouth every 12 hours 13)  Zocor 20 Mg Tabs (Simvastatin) .Marland Kitchen.. 1 by mouth once daily in evening with a low fat snack  Other Orders: Admin 1st Vaccine (60454) Flu Vaccine 74yrs + (09811)  Patient Instructions: 1)  start zocor (simvastatin) 20 mg daily in evening 2)  update me if any side effects and hold it  3)  continue low fat diet- minimize egg yolks  4)  flu shot today  5)  schedule fasting lab 6 weeks lipid/ast/alt 272  6)  you can raise your HDL (good cholesterol) by increasing exercise and eating omega 3 fatty acid supplement like fish oil or flax seed oil over the counter 7)  you can lower LDL (bad cholesterol) by limiting saturated fats in diet like red meat, fried foods, egg yolks, fatty breakfast meats, high fat dairy products and shellfish  Prescriptions: ZOCOR 20 MG TABS (SIMVASTATIN) 1 by mouth once daily in evening with a low fat snack  #30 x 11   Entered and Authorized by:   Judith Part MD    Signed by:   Judith Part MD on 04/02/2010   Method used:   Electronically to        [id number] (retail)       [address]       [place], [place]  [address]       Ph: [phone]       Fax: [phone]   RxID:   [id number]   Current Allergies (reviewed today): No known allergies    Flu Vaccine Consent Questions     Do you have a history of severe allergic reactions to this vaccine? no    Any prior history of allergic reactions to egg and/or gelatin? no    Do you have a sensitivity to the preservative Thimersol? no    Do you have a past history of Guillan-Barre Syndrome? no    Do you currently have an acute febrile illness? no  Have you ever had a severe reaction to latex? no    Vaccine information given and explained to patient? yes    Are you currently pregnant? no    Lot Number:[id number]   Exp Date:12/21/2010   Site Given  Left Deltoid IMlbflu Lewanda Rife LPN  April 02, 2010 4:36 PM

## 2010-07-23 NOTE — Letter (Signed)
Summary: Results Follow-up Letter  [place] at [facility]  [address] [place], [place] [address]   Phone: [phone]  Fax: [phone]    08/08/2009     [address] [place], [place]  [address]    Dear Ms. Wehrenberg,   The following are the results of your recent test(s):  Test     Result     Pap Smear    Normal____x___  Not Normal_____ Comments: Repeat in one year  _________________________________________________________ Cholesterol LDL(Bad cholesterol):          Your goal is less than:         HDL (Good cholesterol):        Your goal is more than: _________________________________________________________ Other Tests:   _________________________________________________________  Please call for an appointment Or _________________________________________________________ _________________________________________________________ _________________________________________________________  Sincerely,  Roxy Manns MD [place] at [facility]

## 2010-07-23 NOTE — Progress Notes (Signed)
Summary: please review lab results  Phone Note Call from Patient Call back at Home Phone [phone]   Summary of Call: Dr. Milinda Antis, please review lab results from pt's visit last month.  The lab failed to send them to [place].  Printed copy is on your shelf, should be in system soon. Initial call taken by: Lowella Petties CMA,  August 30, 2009 2:13 PM  Follow-up for Phone Call        labs are overall ok but cholesterol went up significantly with LDL nowin 150s I would like it below 130 you can raise your HDL (good cholesterol) by increasing exercise and eating omega 3 fatty acid supplement like fish oil or flax seed oil over the counter you can lower LDL (bad cholesterol) by limiting saturated fats in diet like red meat, fried foods, egg yolks, fatty breakfast meats, high fat dairy products and shellfish  please schedule fasting lab in 3 mo and then follow up to disc lipid/ast/alt 272 Follow-up by: Judith Part MD,  August 30, 2009 7:45 PM  Additional Follow-up for Phone Call Additional follow up Details #1::        Patient notified as instructed by telephone. Fasting  lab appointment scheduled as instructed 12/11/09 at 8:30am and f/u appt with Dr Milinda Antis on 12/18/09 at [facility] LPN  August 31, 2009 10:57 AM

## 2010-07-23 NOTE — Letter (Signed)
Summary: Consult Scheduled [facility] at [facility]  [address] [place], [place] [address]   Phone: [phone]  Fax: [phone]    08/09/2009 MRN: [id number]    Dear Ms. Shoults,      We have scheduled an appointment for you.  At the recommendation of Dr.Tower, we have scheduled you a consult with Dr Terrace Arabia on Wednesday,March 23rd 2011 at 8:00am arrival time.  Their phone number is [phone].  If this appointment day and time is not convenient for you, please feel free to call the office of the doctor you are being referred to at the number listed above and reschedule the appointment.  If you have any questions, please call and speak with Carlton Adam at [phone].   Thank you,  Cyndi Bender at [facility]

## 2010-07-23 NOTE — Letter (Signed)
Summary: Generic Letter  [place] at [facility]  [address] [place], [place] [address]   Phone: [phone]  Fax: [phone]    07/25/2009  [facility] [address] [place], [place]  [address]  To whom it may concern,   My patient Michele Cruz may return to work on 08/06/2009 if she is feeling better .  Sincerely,   Roxy Manns MD
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
## 2010-07-23 NOTE — Assessment & Plan Note (Signed)
Summary: FOLLOW UP AFER LABS/RI   Vital Signs:  Patient profile:   55 year old female Height:      68 inches Weight:      195.25 pounds BMI:     29.79 Temp:     98.4 degrees F oral Pulse rate:   80 / minute Pulse rhythm:   regular BP sitting:   112 / 76  (left arm) Cuff size:   regular  Vitals Entered By: Lewanda Rife LPN (2010-01-14 9:07 AM) CC: f/u after labs   History of Present Illness: here for f/u of lipids after labs   is doing well overall   dizziness is finally getting better- still occasional   still running high with trig 60/ HDL 65-- good, but LDL 150 (down from 152) is doing fairly well with diet overall  no red meat / occas to rare fried foods/ too much high fat dairy -- cheese / no fatty breakfast meats/ shrimp twice per year  father had coronary artery disease  pt does take glucosamine -- cosamin DS   continues to have some low back problems -- PT has helped  is starting to do exercises now   also starting to have some mild hot flashes    Allergies (verified): No Known Drug Allergies  Past History:  Past Medical History: Last updated: 08/30/2009 Allergic rhinitis Anemia-iron deficiency Anxiety heavy menses  rosacea zoster  Arthritis Asthma Colon polyps   Past Surgical History: Last updated: 08/30/2009 Caesarean section Left Knee Surgery  Exercise stress test- neg (06/1999) MRI- neg per pt (2003) Breast cyst aspiration (03/2002) Left breast cyst aspirate (11/2002, 12/2004) C-S MRI, C5-6 disk protrusion (03/2004) MRI- right quad fatty defect (2007) Colonoscopy- polyps, hemorrhoids (11/2006)  Family History: Last updated: 2010-01-14 Father: deceased age 84- CAD, ETOH, DM  Mother: obesity, OA , thyroid problems  sister - high chol GM with DM sibs with back problems No FH of Colon Cancer:  Social History: Last updated: 08/30/2009 Marital Status: Married Children: 1 daughter Occupation: Film/video editor schools non smoker Alcohol  Use - yes: less than one daily  Daily Caffeine Use1-2 daily   Risk Factors: Smoking Status: never (05/24/2007)  Family History: Father: deceased age 61- CAD, ETOH, DM  Mother: obesity, OA , thyroid problems  sister - high chol GM with DM sibs with back problems No FH of Colon Cancer:  Review of Systems General:  Denies fatigue and malaise. Eyes:  Denies blurring and eye irritation. CV:  Denies chest pain or discomfort, lightheadness, and palpitations. Resp:  Denies cough, shortness of breath, and wheezing. GI:  Denies abdominal pain, change in bowel habits, and indigestion. MS:  Denies muscle aches and cramps. Derm:  Denies lesion(s), poor wound healing, and rash. Neuro:  Denies headaches. Endo:  Denies cold intolerance and heat intolerance. Heme:  Denies abnormal bruising and bleeding.  Physical Exam  General:  Well-developed,well-nourished,in no acute distress; alert,appropriate and cooperative throughout examination Head:  Normocephalic and atraumatic without obvious abnormalities.  Eyes:  pupils equal, pupils round, pupils reactive to light, and pupils react to accomodation.   Neck:  supple with full rom and no masses or thyromegally, no JVD or carotid bruit  Lungs:  Normal respiratory effort, chest expands symmetrically. Lungs are clear to auscultation, no crackles or wheezes. Heart:  Normal rate and regular rhythm. S1 and S2 normal without gallop, murmur, click, rub or other extra sounds. no murmur heard today Msk:  No deformity or scoliosis noted of thoracic or lumbar  spine.   Pulses:  R and L carotid,radial,femoral,dorsalis pedis and posterior tibial pulses are full and equal bilaterally Extremities:  No clubbing, cyanosis, edema, or deformity noted with normal full range of motion of all joints.   Neurologic:  gait normal and DTRs symmetrical and normal.   Skin:  Intact without suspicious lesions or rashes Cervical Nodes:  No lymphadenopathy noted Psych:  normal  affect, talkative and pleasant    Impression & Recommendations:  Problem # 1:  HYPERCHOLESTEROLEMIA (ICD-272.0) Assessment Deteriorated  disc this in detail today in light of fam hx of coronary artery disease - would like LDL under 130 disc low sat fat diet in detail  suggest avoiding high fat dairy also stop glucosamine - which can raise chol re check in 3 mo - if not imp - will consider statin   Labs Reviewed: SGOT: 19 (12/11/2009)   SGPT: 14 (12/11/2009)   HDL:65.90 (12/11/2009), 60.90 (08/03/2009)  LDL:125 (01/07/2008), 115 (07/14/2006)  Chol:220 (12/11/2009), 227 (08/03/2009)  Trig:60.0 (12/11/2009), 123.0 (08/03/2009)  Complete Medication List: 1)  Buspar 15 Mg Tabs (Buspirone hcl) .... One half pill at bedtime 2)  Meloxicam 15 Mg Tabs (Meloxicam) .... One tablet daily as needed 3)  Finacea 15 % Gel (Azelaic acid) .... Topical cream for rosacea on face 4)  Cetirizine Hcl 10 Mg Tabs (Cetirizine hcl) .... One tablet daily as needed 5)  Vitamin D3 400 Unit Chew (Cholecalciferol) .... Chew two daily 6)  Fish Oil 1200 Mg Caps (Omega-3 fatty acids) .... Take two capsules daily 7)  Magnesium Oxide 400 Mg Tabs (Magnesium oxide) .... One softgel daily 8)  Multivitamins Tabs (Multiple vitamin) .... One daily 9)  Anusol-hc 25 Mg Supp (Hydrocortisone acetate) .... Insert one suppository into rectum for  7 nights then as needed 10)  Advil 200 Mg Tabs (Ibuprofen) .... Otc as directed.  Patient Instructions: 1)  you can raise your HDL (good cholesterol) by increasing exercise and eating omega 3 fatty acid supplement like fish oil or flax seed oil over the counter 2)  you can lower LDL (bad cholesterol) by limiting saturated fats in diet like red meat, fried foods, egg yolks, fatty breakfast meats, high fat dairy products and shellfish  3)  stop the cosamin DS-- if joint pain gets bad , - let me know  4)  schedule fasting labs in 3 months lipid/ast/alt 272  5)  if not improved - would  consider statin chol med   Current Allergies (reviewed today): No known allergies
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
## 2010-07-23 NOTE — Miscellaneous (Signed)
Summary: mammogram results  Clinical Lists Changes  Observations: Added new observation of MAMMO DUE: 07/2010 (07/10/2009 8:10) Added new observation of MAMMOGRAM: normal (07/10/2009 8:10)      Preventive Care Screening  Mammogram:    Date:  07/10/2009    Next Due:  07/2010    Results:  normal

## 2010-07-23 NOTE — Progress Notes (Signed)
Summary: Letter for employer  Phone Note Call from Patient Call back at Home Phone [phone]   Caller: Patient Call For: Dr. Patsy Lager Summary of Call: Calling to see if the letter to her employer is ready to be picked up yet? Initial call taken by: Delilah Shan CMA Duncan Dull),  July 09, 2009 4:20 PM  Follow-up for Phone Call        will call and have patient come in sooner as Dr copland advised.Consuello Masse CMA  Follow-up by: Benny Lennert CMA Duncan Dull),  July 10, 2009 8:02 AM

## 2010-07-23 NOTE — Letter (Signed)
Summary: New Patient letter  [facility]  [address] [place], [place] [address]   Phone: [phone]  Fax: [phone]       08/03/2009 MRN: [id number]  [facility] [address] [place], [place]  [address]  Dear Ms. Walko,  Welcome to the Gastroenterology Division at [facility].    You are scheduled to see Dr. Leone Payor on 08/30/2009 at 3:00PM on the 3rd floor at [facility], [address]  We ask that you try to arrive at our office 15 minutes prior to your appointment time to allow for check-in.  We would like you to complete the enclosed self-administered evaluation form prior to your visit and bring it with you on the day of your appointment.  We will review it with you.  Also, please bring a complete list of all your medications or, if you prefer, bring the medication bottles and we will list them.  Please bring your insurance card so that we may make a copy of it.  If your insurance requires a referral to see a specialist, please bring your referral form from your primary care physician.  Co-payments are due at the time of your visit and may be paid by cash, check or credit card.     Your office visit will consist of a consult with your physician (includes a physical exam), any laboratory testing he/she may order, scheduling of any necessary diagnostic testing (e.g. x-ray, ultrasound, CT-scan), and scheduling of a procedure (e.g. Endoscopy, Colonoscopy) if required.  Please allow enough time on your schedule to allow for any/all of these possibilities.    If you cannot keep your appointment, please call [phone] to cancel or reschedule prior to your appointment date.  This allows [place] the opportunity to schedule an appointment for another patient in need of care.  If you do not cancel or reschedule by 5 p.m. the business day prior to your appointment date, you will be charged a $50.00 late cancellation/no-show fee.    Thank you for choosing  [facility] for your medical needs.  We appreciate the opportunity to care for you.  Please visit [place] at our website  to learn more about our practice.                     Sincerely,                                                             The Gastroenterology Division

## 2010-07-23 NOTE — Progress Notes (Signed)
Summary: pt is asking for lab results  Phone Note Call from Patient Call back at Home Phone [phone] Call back at [phone]   Caller: Patient Call For: Terri Summary of Call: Terri, this pt had labs done at her physical last month but there are no results in her chart.  Can you look into this? Initial call taken by: Lowella Petties CMA,  August 30, 2009 12:41 PM  Follow-up for Phone Call        Terri called lab and results were in their system but had not crossed over to ours. It is now in patients chart. Natasha Chavers CMA Duncan Dull)  August 30, 2009 1:21 PM

## 2010-07-23 NOTE — Assessment & Plan Note (Signed)
Summary: follow up concussion/hmw   Vital Signs:  Patient profile:   55 year old female Height:      68 inches Weight:      189.4 pounds BMI:     28.90 Temp:     98.2 degrees F oral Pulse rate:   88 / minute Pulse rhythm:   regular BP sitting:   110 / 70  (left arm) Cuff size:   regular  Vitals Entered By: Benny Lennert CMA Duncan Dull) (July 18, 2009 4:02 PM)  History of Present Illness: Chief complaint follow up concussion  55 year old:  Still feeling dizzy. Having some nausea. Stopped all pain killers at this point. Globally, however, she is feeling better compared to her last office visit. CT of head negative  A little twinge of some headache, but this is dramatically improved  Still has some parasthesias, as previously in her upper extremitiesl. Some HA that has loosened up a lot.   Went to ENT on her own at [facility], and she saw Dr. Andee Poles given her ongoing dizziness. He felt she may have a component of BPV and wanted to get a brain MRI.  MRI will be done in [place], [facility]  Allergies (verified): No Known Drug Allergies  Past History:  Past medical, surgical, family and social histories (including risk factors) reviewed, and no changes noted (except as noted below).  Past Medical History: Reviewed history from 01/07/2008 and no changes required. Allergic rhinitis Anemia-iron deficiency Anxiety heavy menses  rosacea zoster   Past Surgical History: Reviewed history from 05/24/2007 and no changes required. Caesarean section Exercise stress test- neg (06/1999) MRI- neg per pt (2003) Breast cyst aspiration (03/2002) Left breast cyst aspirate (11/2002, 12/2004) C-S MRI, C5-6 disk protrusion (03/2004) MRI- right quad fatty defect (2007) Colonoscopy- polyps, hemorrhoids (11/2006)  Family History: Reviewed history from 01/07/2008 and no changes required. Father: deceased age 54- CAD, ETOH, DM  Mother: obesity, OA , thyroid problems  Siblings:  GM with  DM sibs with back problems  Social History: Reviewed history from 01/07/2008 and no changes required. 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 Eyes:  Denies blurring. ENT:  Denies ringing in ears. MS:  See HPI. Neuro:  See HPI; Complains of difficulty with concentration, sensation of room spinning, and tingling; denies numbness, seizures, visual disturbances, and weakness; balance impoved compared to last exam. Psych:  Denies anxiety and depression.  Physical Exam  General:  Well-developed,well-nourished,in no acute distress; alert,appropriate and cooperative throughout examination Head:  Normocephalic and atraumatic without obvious abnormalities. No apparent alopecia or balding. Eyes:  vision grossly intact, pupils equal, pupils round, pupils reactive to light, pupils react to accomodation, and corneas and lenses clear.   Ears:  no external deformities.   Nose:  no external deformity.   Neck:  No deformities, masses, or tenderness noted. Lungs:  normal respiratory effort.   Extremities:  No clubbing, cyanosis, edema Psych:  Cognition and judgment appear intact. Alert and cooperative with normal attention span and concentration. No apparent delusions, illusions, hallucinations   Detailed Neurologic Exam  Speech:    Speech is normal; fluent and spontaneous with normal comprehension Cognition:    The patient is oriented to person, place, and time; memory intact; language fluent; normal attention, concentration, and fund of knowledge Cranial Nerves:    The pupils are equal, round, and reactive  to light. The fundi are normal and spontaneous venous pulsations are present. Visual fields are full to finger confrontation. Extraocular movements are intact. Trigeminal sensation is intact and the muscles of mastication are normal. The face is symmetric. The  palate elevates in the midline. Voice is normal. Shoulder shrug is normal. The tongue has normal motion without fasciculations.  Coordination:    Normal finger to nose and heel to shin. Normal rapid alternating movements.   RHOMBERG NORMAL ONE FOOT IN FRONT OF THE OTHER BALANCING, MUCH IMPROVED Gait:    Heel-toe and tandem gait are normal.  Trapezius:    No tightness or tenderness noted.  Observation:    No asymmetry, no atrophy, and no involuntary movements noted.   Tone:    Normal muscle tone.  Posture:    Posture is normal.  Strength:    Strength is V/V in the upper and lower limbs.  Light Touch:    Normal light touch sensation in upper and lower extremities.    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 Balance disturbance improved.   Continue with complete cognitive and physical rest  Extend work note  This patient is postconcussive. BPV is theoretically possible, but constellation of symptoms consistent with resolving acute concussion with postconsussive syndrome  Problem # 2:  NECK PAIN, ACUTE (ICD-723.1) improving   Orders: Radiology Referral (Radiology)  Problem # 3:  HERNIATED CERVICAL DISC (ICD-722.0) cervical radiculopathy continues, will obtain MRI of the cervical spine at the same time as MRI brain  Orders: Radiology Referral (Radiology)  Complete Medication List: 1)  Buspar 15 Mg Tabs (Buspirone hcl) .... One half pill at bedtime  Patient Instructions: 1)  Referral Appointment Information 2)  Day/Date: 3)  Time: 4)  Place/MD: 5)  Address: 6)  Phone/Fax: 7)  Patient given appointment information. Information/Orders faxed/mailed.  8)  and follow-up with Dr. Milinda Antis  Current Allergies (reviewed today): No known allergies
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
## 2010-07-23 NOTE — Letter (Signed)
Summary: Results Follow up Letter  [place] [facility]  [address] [place], [place] [address]   Phone: [phone]  Fax: [phone]    08/08/2009 MRN: [id number]    [facility] [address] [place], [place]  [address]    Dear Ms. Test,  The following are the results of your recent test(s):  Test         Result    Pap Smear:        Normal ___X__  Not Normal _____ Comments:Please repeat in one year. ______________________________________________________ Cholesterol: LDL(Bad cholesterol):         Your goal is less than:         HDL (Good cholesterol):       Your goal is more than: Comments:  ______________________________________________________ Mammogram:        Normal _____  Not Normal _____ Comments:  ___________________________________________________________________ Hemoccult:        Normal _____  Not normal _______ Comments:    _____________________________________________________________________ Other Tests:    We routinely do not discuss normal results over the telephone.  If you desire a copy of the results, or you have any questions about this information we can discuss them at your next office visit.   Sincerely,    Idamae Schuller Tower,MD  MT/ri

## 2010-07-23 NOTE — Progress Notes (Signed)
Summary: Med & Problem List Brought by Patient  Med & Problem List Brought by Patient   Imported By: Lanelle Bal 08/08/2009 11:53:46  _____________________________________________________________________  External Attachment:    Type:   Image     Comment:   External Document

## 2010-07-23 NOTE — Consult Note (Signed)
Summary: [facility]  [facility]   Imported By: Lanelle Bal 09/18/2009 14:08:23  _____________________________________________________________________  External Attachment:    Type:   Image     Comment:   External Document

## 2010-07-23 NOTE — Miscellaneous (Signed)
  Clinical Lists Changes  Observations: Added new observation of LAST PAP DAT: 07-2009 (08/08/2009 10:35) Added new observation of PAP SMEAR: nORMAL  (08/08/2009 10:35)

## 2010-07-23 NOTE — Consult Note (Signed)
Summary: [facility]  [facility]   Imported By: Lanelle Bal 08/17/2009 09:17:16  _____________________________________________________________________  External Attachment:    Type:   Image     Comment:   External Document

## 2010-07-23 NOTE — Assessment & Plan Note (Signed)
Summary: back pain/alc   Vital Signs:  Patient profile:   55 year old female Height:      68 inches Weight:      199.75 pounds BMI:     30.48 Temp:     97.9 degrees F oral Pulse rate:   68 / minute Pulse rhythm:   regular BP sitting:   118 / 70  (left arm) Cuff size:   regular  Vitals Entered By: Linde Gillis CMA Duncan Dull) (January 25, 2010 12:09 PM) CC: back pain   History of Present Illness: thinks she overdid it in the garden  2-3 wk ago - twisted and pulled a wheelbarrow  now pain is going into her R leg and foot - getting worse and waking her up at night thinks never been imaged  some weak feeling -- with pain all the way down to foot no numbness  taking naproxen  no mobic left     sees chiropractor-- for stretching also  saw Dr Patsy Lager in past    Allergies (verified): No Known Drug Allergies  Past History:  Past Medical History: Last updated: 08/30/2009 Allergic rhinitis Anemia-iron deficiency Anxiety heavy menses  rosacea zoster  Arthritis Asthma Colon polyps   Past Surgical History: Last updated: 08/30/2009 Caesarean section Left Knee Surgery  Exercise stress test- neg (06/1999) MRI- neg per pt (2003) Breast cyst aspiration (03/2002) Left breast cyst aspirate (11/2002, 12/2004) C-S MRI, C5-6 disk protrusion (03/2004) MRI- right quad fatty defect (2007) Colonoscopy- polyps, hemorrhoids (11/2006)  Family History: Last updated: 15-Jan-2010 Father: deceased age 42- CAD, ETOH, DM  Mother: obesity, OA , thyroid problems  sister - high chol GM with DM sibs with back problems No FH of Colon Cancer:  Social History: Last updated: 08/30/2009 Marital Status: Married Children: 1 daughter Occupation: Film/video editor schools non smoker Alcohol Use - yes: less than one daily  Daily Caffeine Use1-2 daily   Risk Factors: Smoking Status: never (05/24/2007)  Review of Systems General:  Denies fatigue, fever, loss of appetite, and malaise. Eyes:   Denies blurring and eye irritation. CV:  Denies chest pain or discomfort and palpitations. Resp:  Denies cough and shortness of breath. GI:  Denies abdominal pain, nausea, and vomiting. GU:  Denies dysuria, hematuria, and urinary frequency. MS:  Complains of low back pain and stiffness; denies cramps. Derm:  Denies itching, lesion(s), poor wound healing, and rash. Neuro:  Denies numbness and tingling. Endo:  Denies excessive urination. Heme:  Denies abnormal bruising and bleeding.  Physical Exam  General:  Well-developed,well-nourished,in no acute distress; alert,appropriate and cooperative throughout examination Head:  Normocephalic and atraumatic without obvious abnormalities.  Eyes:  vision grossly intact, pupils equal, pupils round, and pupils reactive to light.   Mouth:  pharynx pink and moist.   Neck:  nl rom  Lungs:  Normal respiratory effort, chest expands symmetrically. Lungs are clear to auscultation, no crackles or wheezes. Heart:  Normal rate and regular rhythm. S1 and S2 normal without gallop, murmur, click, rub or other extra sounds. no murmur heard today Abdomen:  no suprapubic tenderness or fullness felt  Msk:  tender LS L4-L5 mild mildly tender in R buttock also no troch tenderness neg SLR nl rom hips  full flex pain on ext pain on R lat flex Extremities:  No clubbing, cyanosis, edema, or deformity noted with normal full range of motion of all joints.   Neurologic:  strength normal in all extremities, sensation intact to light touch, gait normal, and DTRs symmetrical and  normal.   Skin:  Intact without suspicious lesions or rashes Inguinal Nodes:  No significant adenopathy Psych:  normal affect, talkative and pleasant    Impression & Recommendations:  Problem # 1:  BACK PAIN, LUMBAR, WITH RADICULOPATHY (ICD-724.4) Assessment Deteriorated this is worse after strain/ sprain will change to mobic  continue heat / stretch x ray today - update  look at disc  spaces - may consisder mri vs PT dep on the result also refilled muscle relaxer for night time Her updated medication list for this problem includes:    Meloxicam 15 Mg Tabs (Meloxicam) ..... One tablet daily as needed with food    Advil 200 Mg Tabs (Ibuprofen) ..... Otc as directed.    Tizanidine Hcl 4 Mg Tabs (Tizanidine hcl) .Marland Kitchen... 1 by mouth at bedtime as needed muscle spasm  Orders: T-Lumbar Spine 2 Views (72100TC) Prescription Created Electronically [id number])  Complete Medication List: 1)  Buspar 15 Mg Tabs (Buspirone hcl) .... One half pill at bedtime 2)  Meloxicam 15 Mg Tabs (Meloxicam) .... One tablet daily as needed with food 3)  Finacea 15 % Gel (Azelaic acid) .... Topical cream for rosacea on face 4)  Cetirizine Hcl 10 Mg Tabs (Cetirizine hcl) .... One tablet daily as needed 5)  Vitamin D3 400 Unit Chew (Cholecalciferol) .... Chew two daily 6)  Fish Oil 1200 Mg Caps (Omega-3 fatty acids) .... Take two capsules daily 7)  Magnesium Oxide 400 Mg Tabs (Magnesium oxide) .... One softgel daily 8)  Multivitamins Tabs (Multiple vitamin) .... One daily 9)  Anusol-hc 25 Mg Supp (Hydrocortisone acetate) .... Insert one suppository into rectum for  7 nights then as needed 10)  Advil 200 Mg Tabs (Ibuprofen) .... Otc as directed. 11)  Tizanidine Hcl 4 Mg Tabs (Tizanidine hcl) .Marland Kitchen.. 1 by mouth at bedtime as needed muscle spasm  Patient Instructions: 1)  use mobic and muscle relaxer as needed 2)  x rays today 3)  heat and stretching  4)  update me if worse  5)  will call when results return with a plan Prescriptions: TIZANIDINE HCL 4 MG TABS (TIZANIDINE HCL) 1 by mouth at bedtime as needed muscle spasm  #30 x 0   Entered and Authorized by:   Judith Part MD   Signed by:   Judith Part MD on 01/25/2010   Method used:   Electronically to        [id number]       [address]       [place], [place]  [address]       Ph: [phone]       Fax: [phone]    RxID:   [id number] MELOXICAM 15 MG TABS (MELOXICAM) one tablet daily as needed with food  #30 x 3   Entered and Authorized by:   Judith Part MD   Signed by:   Judith Part MD on 01/25/2010   Method used:   Electronically to        [id number]       [address]       [place], [place]  [address]       Ph: [phone]       Fax: [phone]   RxID:   [id number]   Current Allergies (reviewed today): No known allergies

## 2010-07-25 NOTE — Assessment & Plan Note (Signed)
Summary: DRY COUGH/JRR   Vital Signs:  Patient profile:   55 year old female Weight:      193.50 pounds Temp:     98.7 degrees F oral Pulse rate:   84 / minute Pulse rhythm:   regular BP sitting:   100 / 70  (left arm) Cuff size:   regular  Vitals Entered By: Sydell Axon LPN (June 27, 2010 3:30 PM) CC: Dry cough for a couple of days   History of Present Illness: Pt here for hacking cough for a few days that is keeping her awake. She denies fever but has had some chills, some headache in the eye area but was dilated yesterday. She has had ear itching, no pain, no rhinitis, no ST, cough that is reasonablyt nonproductive. No N/V. She has taken Delsym and DM.  Problems Prior to Update: 1)  Hypercholesterolemia  (ICD-272.0) 2)  Hemorrhoids, With Bleeding  (ICD-455.8) 3)  Colonic Polyps, Hx of  (ICD-V12.72) 4)  Postconcussion Syndrome  (ICD-310.2) 5)  Herniated Cervical Disc  (ICD-722.0) 6)  Back Pain, Lumbar, With Radiculopathy  (ICD-724.4) 7)  Menorrhalgia  (ICD-625.3) 8)  Dysmenorrhea  (ICD-625.3) 9)  Rosacea  (ICD-695.3) 10)  Cardiac Murmur  (ICD-785.2) 11)  Anxiety  (ICD-300.00) 12)  Anemia-iron Deficiency  (ICD-280.9) 13)  Allergic Rhinitis  (ICD-477.9)  Medications Prior to Update: 1)  Buspar 15 Mg  Tabs (Buspirone Hcl) .... One Half Pill At Bedtime 2)  Meloxicam 15 Mg Tabs (Meloxicam) .... One Tablet Daily As Needed With Food 3)  Finacea 15 % Gel (Azelaic Acid) .... Topical Cream For Rosacea On Face 4)  Cetirizine Hcl 10 Mg Tabs (Cetirizine Hcl) .... One Tablet Daily As Needed 5)  Vitamin D3 400 Unit Chew (Cholecalciferol) .... Chew Two Daily 6)  Fish Oil 1200 Mg Caps (Omega-3 Fatty Acids) .... Take Two Capsules Daily 7)  Magnesium Oxide 400 Mg Tabs (Magnesium Oxide) .... One Softgel Daily 8)  Multivitamins   Tabs (Multiple Vitamin) .... One Daily 9)  Anusol-Hc 25 Mg Supp (Hydrocortisone Acetate) .... Insert One Suppository Into Rectum For  7 Nights Then As  Needed 10)  Advil 200 Mg Tabs (Ibuprofen) .... Otc As Directed. 11)  Tizanidine Hcl 4 Mg Tabs (Tizanidine Hcl) .Marland Kitchen.. 1 By Mouth At Bedtime As Needed Muscle Spasm 12)  Naproxen ?mg Otc .... One Tablet By Mouth Every 12 Hours 13)  Zocor 20 Mg Tabs (Simvastatin) .Marland Kitchen.. 1 By Mouth Once Daily in Evening With A Low Fat Snack  Allergies: No Known Drug Allergies  Physical Exam  General:  Well-developed,well-nourished,in no acute distress; alert,appropriate and cooperative throughout examination. Head:  Normocephalic, atraumatic, and no abnormalities observed.  Sinuses NT. Eyes:  Conjunctiva clear bilaterally.  Ears:  External ear exam shows no significant lesions or deformities.  Otoscopic examination reveals clear canals, tympanic membranes are intact bilaterally without bulging, retraction, inflammation or discharge. Hearing is grossly normal bilaterally. Nose:  External nasal examination shows no deformity or inflammation. Nasal mucosa are pink and moist without lesions or exudates. Mouth:  Oral mucosa and oropharynx without lesions or exudates.  Teeth in good repair. No PND seen. Neck:  No deformities, masses, or tenderness noted. Lungs:  Normal respiratory effort, chest expands symmetrically. Lungs are clear to auscultation, no crackles or wheezes. Good air flow.   Impression & Recommendations:  Problem # 1:  COUGH (ICD-786.2) Assessment New See instructions. Long time spent discussing URI sxs and symptomatic trmt.  Complete Medication List: 1)  Buspar 15 Mg  Tabs (Buspirone hcl) .... One half pill at bedtime 2)  Meloxicam 15 Mg Tabs (Meloxicam) .... One tablet daily as needed with food 3)  Finacea 15 % Gel (Azelaic acid) .... Topical cream for rosacea on face 4)  Cetirizine Hcl 10 Mg Tabs (Cetirizine hcl) .... One tablet daily as needed 5)  Vitamin D3 400 Unit Chew (Cholecalciferol) .... Chew two daily 6)  Fish Oil 1200 Mg Caps (Omega-3 fatty acids) .... Take two capsules daily 7)   Magnesium Oxide 400 Mg Tabs (Magnesium oxide) .... One softgel daily 8)  Multivitamins Tabs (Multiple vitamin) .... One daily 9)  Anusol-hc 25 Mg Supp (Hydrocortisone acetate) .... Insert one suppository into rectum for  7 nights then as needed 10)  Advil 200 Mg Tabs (Ibuprofen) .... Otc as directed. 11)  Tizanidine Hcl 4 Mg Tabs (Tizanidine hcl) .Marland Kitchen.. 1 by mouth at bedtime as needed muscle spasm 12)  Naproxen ?mg Otc  .... One tablet by mouth every 12 hours 13)  Zocor 20 Mg Tabs (Simvastatin) .Marland Kitchen.. 1 by mouth once daily in evening with a low fat snack 14)  Tussionex Pennkinetic Er 10-8 Mg/80ml Lqcr (Hydrocod polst-chlorphen polst) .... One tsp by mouth at bedtime if needed for cough.  Patient Instructions: 1)  Take Guaifenesin by [facility] or [facility] and getting MUCOUS RELIEF EXPECTORANT (400mg ), take 11/2 tabs by mouth AM and NOON. 2)  Drink lots of fluids anytime taking Guaifenesin.  3)  Take Tussionex at night as needed.  4)  Keep lozenge in mouth.  5)  RTC/call for worsening sxs as discussed. Prescriptions: TUSSIONEX PENNKINETIC ER 10-8 MG/5ML LQCR (HYDROCOD POLST-CHLORPHEN POLST) one tsp by mouth at bedtime if needed for cough.  #4 oz x 0   Entered and Authorized by:   Shaune Leeks MD   Signed by:   Shaune Leeks MD on 06/27/2010   Method used:   Print then Give to Patient   RxID:   [id number]    Orders Added: 1)  Est. Patient Level III [65784]    Current Allergies (reviewed today): No known allergies

## 2010-11-05 NOTE — Assessment & Plan Note (Signed)
NAME:  Michele Cruz, Michele Cruz              ACCOUNT NO.:  [id number]   MEDICAL RECORD NO.:  [id number]          PATIENT TYPE:  POB   LOCATION:  [facility] at [facility]         FACILITY:  [facility]   PHYSICIAN:  Elsie Lincoln, MD      DATE OF BIRTH:  28-Jun-1955   DATE OF SERVICE:  02/03/2008                                  CLINIC NOTE   The patient is a 55 year old G2, para 1-0-1-1, LMP on January 17, 2008, who  presents for mild cramping and was found to have a small anterior  fibroid 2.3 x 1.8 x 2.7 and 2 small ovarian cysts that were about 2 cm  in the right ovary, but were not qualified as to complex or simple.  The  patient is mostly here because of the presence of this and not that she  is having that much pain.  She does have heavy periods and does not know  if she is anemic or not.  CBC was drawn a couple of weeks ago at Dr.  Royden Purl office and we will get those results.  The patient has very  regular cycles and they are getting slightly shorter, they are heavy  with moderate pain.  We did discuss taking naproxen around the clock at  the very beginning of the period to help decrease flow and decrease  pain.  She uses a vasectomy for contraception.   OBSTETRICAL HISTORY:  C-section x1.   GYN HISTORY:  No history of abnormal Pap smears or sexually transmitted  diseases.  Fibroid and ovarian cyst as above.   SURGICAL HISTORY:  C-section x1 and arthroscopic knee surgery in 2009.   MEDICAL HISTORY:  Osteoarthritis, heart murmur that is questionably from  a ASD or VSD that has now developed.  She has asthma, borderline high  cholesterol, and anxiety disorder.   SOCIAL HISTORY:  The patient lives with her husband and daughter and  drinks 2 caffeinated beverages a day.  She is a social drinker.  No  tobacco or drugs.  No history of sexual abuse.   Systemic review is positive for fatigue and again she did have a TSH  drawn and we will get a copy of that sent to [place].   HEALTHCARE MAINTENANCE:  The  patient had a colonoscopy in 2008, and it  was found to be normal other than 2 small hemorrhoids.  Pap smear and  mammogram are up-to-date per the patient done by Dr. Milinda Antis.   MEDICATIONS:  BuSpar, Finacea, loratadine, naproxen, multivitamin, fish  oil, calcium, and glucosamine.   ALLERGIES:  Denies.   PHYSICAL EXAMINATION:  GENERAL:  Well nourished, well developed, in no  apparent distress.  VITAL SIGNS:  Pulse 81, blood pressure 138/83, weight 193, and height 5  feet 7-1/2 inches.  CHEST:  Clear.  ABDOMEN:  Soft and nontender.  No organomegaly.  No hernia.  GENITALIA:  There is a seborrheic keratosis on the right portion of her  mons pubis.  She has Tanner 5 hair pattern.  Vagina is pink with normal  rugae.  Bladder nontender.  Urethra nontender and good support.  No  rectocele.  Cervix closed.  Uterus feels about 12 cm, 12-weeks  sized,  and not painful on palpation, but more crampy.  No ovarian cysts or  masses can be felt on bimanual.  EXTREMITIES:  Nontender.  No edema.   ASSESSMENT AND PLAN:  A 55 year old female with small anterior fibroid  and 2 ovarian cysts.  1. Followup ultrasound in 4 weeks.  2. TSH and CBC.  3. Could possibly address heavy menses if she is anemic with a Mirena,      __________and not start birth control pills.  4. The patient is to come back in 4-5 weeks for results.           ______________________________  Elsie Lincoln, MD     KL/MEDQ  D:  02/03/2008  T:  02/03/2008  Job:  [id number]

## 2010-12-04 ENCOUNTER — Ambulatory Visit (INDEPENDENT_AMBULATORY_CARE_PROVIDER_SITE_OTHER): Payer: BC Managed Care – PPO | Admitting: Family Medicine

## 2010-12-04 ENCOUNTER — Encounter: Payer: Self-pay | Admitting: Family Medicine

## 2010-12-04 VITALS — BP 108/78 | HR 76 | Temp 99.2°F | Wt 196.8 lb

## 2010-12-04 DIAGNOSIS — J069 Acute upper respiratory infection, unspecified: Secondary | ICD-10-CM

## 2010-12-04 MED ORDER — AMOXICILLIN 875 MG PO TABS: 875.0000 mg | ORAL_TABLET | Freq: Two times a day (BID) | ORAL | Status: AC

## 2010-12-04 MED ORDER — FLUTICASONE PROPIONATE 50 MCG/ACT NA SUSP: 2.0000 | Freq: Every day | NASAL | Status: DC

## 2010-12-04 NOTE — Assessment & Plan Note (Addendum)
Viral ddx d/w pt.  Hold abx as she is nontoxic and 4-5 days in.  Start after 1 week if not better.  flonase and supportive tx in meantime.  Fu prn.  She agrees.

## 2010-12-04 NOTE — Patient Instructions (Signed)
Use the flonase daily.  Drink plenty of fluids, take tylenol as needed, and start the amoxil if not better by the weekend.  This should gradually improve.  Take care.  Let us know if you have other concerns.

## 2010-12-04 NOTE — Progress Notes (Signed)
duration of symptoms: started with sneezing and joint aches about 4-5 days ago. Tooth pain and sinus pressure, cough.  Rhinorrhea: yes congestion:yes ear pain: no pain but congested  sore throat:no Cough:yes, occ Myalgias: no but B knee pain, chest sore from cough other concerns: trouble sleeping with cough.  Works in the school system.   Mild elevation in temp noted.   No rash.   ROS: See HPI.  Otherwise negative.    Meds, vitals, and allergies reviewed.   GEN: nad, alert and oriented HEENT: mucous membranes moist, TM w/o erythema, nasal epithelium injected, OP with cobblestoning, frontal and max sinus not ttp NECK: supple w/o LA CV: rrr. PULM: ctab, no inc wob ABD: soft, +bs EXT: no edema

## 2011-01-28 ENCOUNTER — Ambulatory Visit (INDEPENDENT_AMBULATORY_CARE_PROVIDER_SITE_OTHER): Payer: BC Managed Care – PPO | Admitting: Family Medicine

## 2011-01-28 ENCOUNTER — Encounter: Payer: Self-pay | Admitting: Family Medicine

## 2011-01-28 VITALS — BP 120/72 | HR 72 | Temp 98.4°F | Ht 67.5 in | Wt 201.0 lb

## 2011-01-28 DIAGNOSIS — B029 Zoster without complications: Secondary | ICD-10-CM | POA: Insufficient documentation

## 2011-01-28 DIAGNOSIS — M25569 Pain in unspecified knee: Secondary | ICD-10-CM

## 2011-01-28 DIAGNOSIS — M25561 Pain in right knee: Secondary | ICD-10-CM

## 2011-01-28 MED ORDER — VALACYCLOVIR HCL 1 G PO TABS: 1000.0000 mg | ORAL_TABLET | Freq: Three times a day (TID) | ORAL | Status: DC

## 2011-01-28 NOTE — Progress Notes (Signed)
  Subjective:    Patient ID: Michele Cruz, female    DOB: 06-23-1956, 55 y.o.   MRN: [id number]  HPI  Shingles Was really stressed out yesterday.  Michele Cruz also started to have an acute rash outbreak on her left side, some few lesions, elevated. Few have been scratched. Some with pain in and around that region. No known exposures.   Patient presents with approx 7 day h/o R sided knee pain after no occult injury.  The patient has had an effusion. No symptomatic giving-way. + mechanical clicking. Joint has not locked up. Patient has been able to walk. Prior MRI on R knee showed a meniscal tear.   Pain location: medial and lateral Current physical activity: Bracing: neoprene sleve Occupation or school level:  The PMH, PSH, Social History, Family History, Medications, and allergies have been reviewed in [facility], and have been updated if relevant.  Review of Systems ROS: GEN: Acute illness details above GI: Tolerating PO intake GU: maintaining adequate hydration and urination Pulm: No SOB Interactive and getting along well at home.  Otherwise, ROS is as per the HPI.     Objective:   Physical Exam   Physical Exam  Blood pressure 120/72, pulse 72, temperature 98.4 F (36.9 C), temperature source Oral, height 5' 7.5" (1.715 m), weight 201 lb (91.173 kg), SpO2 99.00%.  GEN: Well-developed,well-nourished,in no acute distress; alert,appropriate and cooperative throughout examination HEENT: Normocephalic and atraumatic without obvious abnormalities. Ears, externally no deformities PULM: Breathing comfortably in no respiratory distress EXT: No clubbing, cyanosis, or edema PSYCH: Normally interactive. Cooperative during the interview. Pleasant. Friendly and conversant. Not anxious or depressed appearing. Normal, full affect. Skin: elevated scattered vesicular lesions left posterior shoulder blade area, scattered, some going down to thorax  Gait: Normal heel toe pattern Effusion: L  moderate Echymosis or edema: none Patellar tendon NT Patellar grind: pos NOTABLE CREPITUS B Medial and lateral patellar facet loading: negative medial and lateral joint lines: mild TTP medial and lateral B Mcmurray's neg Flexion-pinch neg Varus and valgus stress: stable Lachman: neg Ant and Post drawer: neg Hamstring concentric and eccentric: 5/5       Assessment & Plan:   1. Shingles  valACYclovir (VALTREX) 1000 MG tablet  2. Knee pain, right     Acute shingles. Reviewed care  Probable OA flare. Cont naprosyn for now and ice. F/u and can reassess if continues after acute illness.

## 2011-03-05 ENCOUNTER — Other Ambulatory Visit: Payer: Self-pay

## 2011-03-05 NOTE — Telephone Encounter (Signed)
[facility] faxed refill request Buspirone 15 mg. Last filled 12/05/10.Please advise.

## 2011-03-06 ENCOUNTER — Other Ambulatory Visit: Payer: Self-pay | Admitting: *Deleted

## 2011-03-06 MED ORDER — BUSPIRONE HCL 15 MG PO TABS: 7.5000 mg | ORAL_TABLET | Freq: Every day | ORAL | Status: DC

## 2011-03-06 NOTE — Telephone Encounter (Signed)
Will refill electronically  

## 2011-03-25 ENCOUNTER — Telehealth: Payer: Self-pay | Admitting: Family Medicine

## 2011-03-25 DIAGNOSIS — D509 Iron deficiency anemia, unspecified: Secondary | ICD-10-CM

## 2011-03-25 DIAGNOSIS — E7800 Pure hypercholesterolemia, unspecified: Secondary | ICD-10-CM

## 2011-03-25 DIAGNOSIS — Z0000 Encounter for general adult medical examination without abnormal findings: Secondary | ICD-10-CM

## 2011-03-25 NOTE — Telephone Encounter (Signed)
Message copied by Judy Pimple on Tue Mar 25, 2011  8:41 PM ------      Message from: Alvina Chou      Created: Tue Mar 25, 2011 11:09 AM      Regarding: Labs for 10-4 Thursday       Patient is scheduled for CPX labs, please order future labs, Thanks , Camelia Eng

## 2011-03-27 ENCOUNTER — Other Ambulatory Visit (INDEPENDENT_AMBULATORY_CARE_PROVIDER_SITE_OTHER): Payer: BC Managed Care – PPO

## 2011-03-27 ENCOUNTER — Ambulatory Visit (INDEPENDENT_AMBULATORY_CARE_PROVIDER_SITE_OTHER): Payer: BC Managed Care – PPO

## 2011-03-27 DIAGNOSIS — Z23 Encounter for immunization: Secondary | ICD-10-CM

## 2011-03-27 DIAGNOSIS — D509 Iron deficiency anemia, unspecified: Secondary | ICD-10-CM

## 2011-03-27 DIAGNOSIS — E7800 Pure hypercholesterolemia, unspecified: Secondary | ICD-10-CM

## 2011-03-27 DIAGNOSIS — Z0000 Encounter for general adult medical examination without abnormal findings: Secondary | ICD-10-CM

## 2011-03-27 LAB — COMPREHENSIVE METABOLIC PANEL
ALT: 17 U/L (ref 0–35)
Albumin: 4.2 g/dL (ref 3.5–5.2)
CO2: 27 mEq/L (ref 19–32)
Calcium: 9.5 mg/dL (ref 8.4–10.5)
Chloride: 107 mEq/L (ref 96–112)
GFR: 86.7 mL/min (ref >60.00)
Glucose, Bld: 98 mg/dL (ref 70–99)
Sodium: 141 mEq/L (ref 135–145)
Total Bilirubin: 1.2 mg/dL (ref 0.3–1.2)
Total Protein: 6.9 g/dL (ref 6.0–8.3)

## 2011-03-27 LAB — CBC WITH DIFFERENTIAL/PLATELET
Eosinophils Relative: 6.1 % — ABNORMAL HIGH (ref 0.0–5.0)
HCT: 41.8 % (ref 36.0–46.0)
Hemoglobin: 14 g/dL (ref 12.0–15.0)
Lymphocytes Relative: 35.4 % (ref 12.0–46.0)
Lymphs Abs: 1.5 10*3/uL (ref 0.7–4.0)
Monocytes Relative: 7.6 % (ref 3.0–12.0)
Neutro Abs: 2.1 10*3/uL (ref 1.4–7.7)
Platelets: 170 10*3/uL (ref 150.0–400.0)
WBC: 4.2 10*3/uL — ABNORMAL LOW (ref 4.5–10.5)

## 2011-03-27 LAB — LDL CHOLESTEROL, DIRECT: Direct LDL: 152.4 mg/dL

## 2011-03-27 LAB — LIPID PANEL
Cholesterol: 219 mg/dL — ABNORMAL HIGH (ref 0–200)
VLDL: 9.8 mg/dL (ref 0.0–40.0)

## 2011-04-07 ENCOUNTER — Ambulatory Visit (INDEPENDENT_AMBULATORY_CARE_PROVIDER_SITE_OTHER): Payer: BC Managed Care – PPO | Admitting: Family Medicine

## 2011-04-07 ENCOUNTER — Encounter: Payer: Self-pay | Admitting: Family Medicine

## 2011-04-07 VITALS — BP 114/68 | HR 68 | Temp 98.7°F | Ht 67.0 in | Wt 199.5 lb

## 2011-04-07 DIAGNOSIS — N946 Dysmenorrhea, unspecified: Secondary | ICD-10-CM

## 2011-04-07 DIAGNOSIS — M25569 Pain in unspecified knee: Secondary | ICD-10-CM

## 2011-04-07 DIAGNOSIS — Z0000 Encounter for general adult medical examination without abnormal findings: Secondary | ICD-10-CM

## 2011-04-07 DIAGNOSIS — Z1231 Encounter for screening mammogram for malignant neoplasm of breast: Secondary | ICD-10-CM

## 2011-04-07 DIAGNOSIS — T148 Other injury of unspecified body region: Secondary | ICD-10-CM

## 2011-04-07 DIAGNOSIS — E7800 Pure hypercholesterolemia, unspecified: Secondary | ICD-10-CM

## 2011-04-07 DIAGNOSIS — T148XXA Other injury of unspecified body region, initial encounter: Secondary | ICD-10-CM

## 2011-04-07 DIAGNOSIS — W57XXXA Bitten or stung by nonvenomous insect and other nonvenomous arthropods, initial encounter: Secondary | ICD-10-CM | POA: Insufficient documentation

## 2011-04-07 MED ORDER — ATORVASTATIN CALCIUM 10 MG PO TABS: 10.0000 mg | ORAL_TABLET | Freq: Every day | ORAL | Status: DC

## 2011-04-07 NOTE — Progress Notes (Signed)
Subjective:    Patient ID: Michele Cruz, female    DOB: [birth year], 55 y.o.   MRN: [id number]  HPI Here for annual health mt exam and to review chronic medical problems and to disc knee pain  Had a stressful year and summer Got over her shingles quickly  Has had it 3 times in that area    114/68- good bp  Wt is down 3 lb with bmi of 31 Is eating less in general  Not exercising - knees give her a really hard time   Is wondering if she could have had lyme dz in past - gets deer ticks all the time  Had a rash about 9 months ago and did get a big red blotch with a bite in the past  Has had knee problems in past - saw Dr Katrinka Blazing over the summer -- OA  Told her that she would need knee repl in the past  Also had Dr Patsy Lager look at her knees   Lipids are fairly stable with LDL in 150s Non tol of simvastatin in the past- gave her memory issues , not aches and pains  Lab Results  Component Value Date   CHOL 219* 03/27/2011   CHOL 150 07/03/2010   CHOL 221* 03/20/2010   Lab Results  Component Value Date   HDL 62.10 03/27/2011   HDL 40.98 07/03/2010   HDL 11.91 03/20/2010   Lab Results  Component Value Date   LDLCALC 86 07/03/2010   LDLCALC 125* 01/07/2008   LDLCALC 115* 07/14/2006   Lab Results  Component Value Date   TRIG 49.0 03/27/2011   TRIG 41.0 07/03/2010   TRIG 69.0 03/20/2010   Lab Results  Component Value Date   CHOLHDL 4 03/27/2011   CHOLHDL 3 07/03/2010   CHOLHDL 4 03/20/2010   Lab Results  Component Value Date   LDLDIRECT 152.4 03/27/2011   LDLDIRECT 150.6 03/20/2010   LDLDIRECT 150.4 12/11/2009     Diet= is really good - knows what she cannot eat Is open to other statins - to try    Mam 1/11 ok  Needs to set that up - breast center  Self exam- no lumps or changes   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 HYPERCHOLESTEROLEMIA  . ANEMIA-IRON DEFICIENCY  . ANXIETY  . POSTCONCUSSION SYNDROME  . HEMORRHOIDS, WITH BLEEDING  . ALLERGIC RHINITIS  . Dysmenorrhea  . ROSACEA  . HERNIATED CERVICAL DISC  . BACK PAIN, LUMBAR, WITH RADICULOPATHY  . CARDIAC MURMUR  . COLONIC POLYPS, HX OF  . Shingles  . Routine general medical examination at a health care facility  . Knee pain  . Tick bites  . Other screening mammogram   Past Medical History  Diagnosis Date  . Allergic rhinitis   . Anemia, iron deficiency   . Anxiety   . Heavy menses   . Rosacea   . Zoster   . Arthritis   . Asthma   . Colon polyps   . Spondylosis     deg lumbar disc dz  . History of exercise stress test 06/1999    neg  . History of MRI 2003    neg per patient  . History  of MRI of spine 03/2004    C-5, C5-6 disk protrusion  . History of MRI 2007    right quad fatty defect   Past Surgical History  Procedure Date  . Cesarean section   . Knee surgery     left  . Breast cyst aspiration 03/2002  . Breast cyst aspiration 03/2004    left   History  Substance Use Topics  . Smoking status: Never Smoker   . Smokeless tobacco: Never Used  . Alcohol Use: Yes     occassionally   Family History  Problem Relation Age of Onset  . Arthritis Mother     OA  . Obesity Mother   . Heart disease Father     CAD  . Alcohol abuse Father   . Diabetes Father   . Hyperlipidemia Sister   . Diabetes Other    Allergies  Allergen Reactions  . Meloxicam     Pt felt dizzy potentially from this, but does well on aleve  . Simvastatin     Possible transient memory changes, resolved off medicine   Current Outpatient Prescriptions on File Prior to Visit  Medication Sig Dispense Refill  . aspirin 81 MG tablet Take 81 mg by mouth daily.        . Azelaic Acid (FINACEA) 15 % cream Topical cream for rosacea on face, use as directed       .  busPIRone (BUSPAR) 15 MG tablet Take 0.5 tablets (7.5 mg total) by mouth at bedtime. Take 1/2 by mouth at bedtime  15 tablet  11  . magnesium oxide (MAG-OX) 400 MG tablet Take 400 mg by mouth daily.        . Multiple Vitamin (MULTIVITAMIN) tablet Take 1 tablet by mouth daily.        . Naproxen (NAPROSYN PO) OTC; take one by mouth every 12 hours       . Omega-3 Fatty Acids (FISH OIL) 1200 MG CAPS Take two by mouth daily       . valACYclovir (VALTREX) 1000 MG tablet Take 1 tablet (1,000 mg total) by mouth 3 (three) times daily.  21 tablet  0         Review of Systems Review of Systems  Constitutional: Negative for fever, appetite change, fatigue and unexpected weight change.  Eyes: Negative for pain and visual disturbance.  Respiratory: Negative for cough and shortness of breath.   Cardiovascular: Negative for cp or palpitations    Gastrointestinal: Negative for nausea, diarrhea and constipation.  Genitourinary: Negative for urgency and frequency.  Skin: Negative for pallor or rash   MSK pos for knee pain  Neurological: Negative for weakness, light-headedness, numbness and headaches.  Hematological: Negative for adenopathy. Does not bruise/bleed easily.  Psychiatric/Behavioral: Negative for dysphoric mood. The patient is not nervous/anxious.          Objective:   Physical Exam  Constitutional: She appears well-developed and well-nourished. No distress.  HENT:  Head: Normocephalic and atraumatic.  Right Ear: External ear normal.  Left Ear: External ear normal.  Nose: Nose normal.  Mouth/Throat: Oropharynx is clear and moist.  Eyes: Conjunctivae and EOM are normal. Pupils are equal, round, and reactive to light. No scleral icterus.  Neck: Normal range of motion. Neck supple. No JVD present. Carotid bruit is not present. No thyromegaly present.  Cardiovascular: Normal rate, regular rhythm, normal heart sounds and intact distal pulses.   Pulmonary/Chest: Effort normal and breath  sounds normal. No respiratory distress. She has no  wheezes.  Abdominal: Soft. Bowel sounds are normal. She exhibits no distension and no mass. There is no tenderness.  Genitourinary: No breast swelling, tenderness, discharge or bleeding.  Musculoskeletal: Normal range of motion. She exhibits tenderness. She exhibits no edema.       bilat knee crepitice and joint line tenderness  No joint swelling   Lymphadenopathy:    She has no cervical adenopathy.  Neurological: She is alert. She has normal reflexes. No cranial nerve deficit. Coordination normal.  Skin: Skin is warm and dry. No rash noted. No erythema. No pallor.  Psychiatric: She has a normal mood and affect.          Assessment & Plan:

## 2011-04-07 NOTE — Assessment & Plan Note (Signed)
Lipids still high- intol of zocor - will try lipitor Adv to call and stop it if side eff Rev low sat fat diet  Lab in 6 weeks

## 2011-04-07 NOTE — Assessment & Plan Note (Signed)
Pt had several tick bites this season and in light of a few large local rxn and rash and current worse knee pain - wants to check antibodies -- will test that today

## 2011-04-07 NOTE — Assessment & Plan Note (Signed)
Nl breast exam Mam scheduled  Disc imp of self breast exams

## 2011-04-07 NOTE — Assessment & Plan Note (Signed)
Reviewed health habits including diet and exercise and skin cancer prevention Also reviewed health mt list, fam hx and immunizations  Rev wellness labs in detail 

## 2011-04-07 NOTE — Assessment & Plan Note (Signed)
Pt has known OA and is interested in tx options such as injections - not ready for knee repl Will fu with Dr Patsy Lager

## 2011-04-07 NOTE — Patient Instructions (Signed)
Consider making an appt with Dr Patsy Lager for your knee pain  Will check lyme disease lab today Try lipitor for cholesterol - if any side effects stop it and call  Schedule fasting lab in 6 weeks  We will refer you for a mammogram at check out

## 2011-04-08 LAB — B. BURGDORFI ANTIBODIES: B burgdorferi Ab IgG+IgM: 0.13 {ISR}

## 2011-05-13 ENCOUNTER — Telehealth: Payer: Self-pay | Admitting: *Deleted

## 2011-05-13 NOTE — Telephone Encounter (Signed)
Ok - stop it and update me in 2 weeks with how pain is -thanks Please add to list for med intolerance and take off med list

## 2011-05-13 NOTE — Telephone Encounter (Signed)
FYI pt states atorvastatin is not working for her , she c/o leg cramps and pain and will stop taking it as of today.

## 2011-05-13 NOTE — Telephone Encounter (Signed)
Patient notified as instructed by telephone. Pt said she can already tell pain is virtually gone. Med list updated as well as med intolerance.

## 2011-05-19 ENCOUNTER — Other Ambulatory Visit: Payer: BC Managed Care – PPO

## 2011-08-20 ENCOUNTER — Encounter: Payer: Self-pay | Admitting: Family Medicine

## 2011-08-20 ENCOUNTER — Ambulatory Visit (INDEPENDENT_AMBULATORY_CARE_PROVIDER_SITE_OTHER): Payer: BC Managed Care – PPO | Admitting: Family Medicine

## 2011-08-20 DIAGNOSIS — M1712 Unilateral primary osteoarthritis, left knee: Secondary | ICD-10-CM

## 2011-08-20 DIAGNOSIS — M25569 Pain in unspecified knee: Secondary | ICD-10-CM

## 2011-08-20 NOTE — Patient Instructions (Signed)
OSTEOARTHRITIS:  For symptomatic relief: Tylenol: 2 tablets up to 3-4 times a day Regular NSAIDS are helpful (avoid in kidney disease and ulcers) Topical Capzaicin Cream, as needed (wear glove to put on) Topical Voltaren (NSAID) Gel can help   For flares, corticosteroid injections help. Hyaluronic Acid injections have good success, average relief is 6 months  Glucosamine and Chondroitin often helpful - will take about 3 months to see if you have an effect. If you do, great, keep them up, if none at that point, no need to take in the future.  Omega-3 fish oils may help, 2 grams daily  Ice joints on bad days, 20 min, 2-3 x / day REGULAR EXERCISE: swimming, Yoga, Tai Chi, bicycle (NON-IMPACT activity)  

## 2011-08-20 NOTE — Progress Notes (Signed)
Patient Name: Michele Cruz Date of Birth: 1955/09/17 Age: 56 y.o. Medical Record Number: [id number] Gender: female Date of Encounter: 08/20/2011  History of Present Illness:  Michele Cruz is a 56 y.o. very pleasant female patient who presents with the following:  Saw Dr. Reita Chard, s/p partial menisectomy on the LEFT, and everything was basically OK.  And then over the summer, both knees had been getting pregressively weaker. Walking but not doing well. Bone on bone, both knees - per Dr. Michaelle Copas report to her, but I do not have the original films to review myself.  Not bending knees  If walking too much, will throb and ache like a tooth ache.   Naproxen 220 bid. She's not doing that much every day.  Very rare Tylenol. She does have pain going down stairs and with walking and uneven surfaces. She has decreased her physical activity over the last year or so. No mechanical buckling. No locking up. No giving way of her joint. She has used knee braces which has helped some with support.   Past Medical History, Surgical History, Social History, Family History, Problem List, Medications, and Allergies have been reviewed and updated if relevant.  Review of Systems:  GEN: No fevers, chills. Nontoxic. Primarily MSK c/o today. MSK: Detailed in the HPI GI: tolerating PO intake without difficulty Neuro: No numbness, parasthesias, or tingling associated. Otherwise the pertinent positives of the ROS are noted above.    Physical Examination: Filed Vitals:   08/20/11 1541  BP: 110/72  Pulse: 64  Temp: 98.2 F (36.8 C)  TempSrc: Oral  Height: 5' 7.5" (1.715 m)  Weight: 202 lb 12.8 oz (91.989 kg)  SpO2: 99%    Body mass index is 31.29 kg/(m^2).   GEN: WDWN, NAD, Non-toxic, Alert & Oriented x 3 HEENT: Atraumatic, Normocephalic.  Ears and Nose: No external deformity. EXTR: No clubbing/cyanosis/edema NEURO: Normal gait.  PSYCH: Normally interactive. Conversant. Not depressed or  anxious appearing.  Calm demeanor.   Knee:  B Gait: Normal heel toe pattern ROM: loss of 2 deg ext to 120 B Effusion: neg Echymosis or edema: none Patellar tendon NT Painful PLICA: neg Patellar grind: negative Medial and lateral patellar facet loading: negative medial and lateral joint lines: mild ttp medially and laterally B Mcmurray's neg Flexion-pinch mod pain Varus and valgus stress: stable Lachman: neg Ant and Post drawer: neg Hip abduction, IR, ER: WNL Hip flexion str: 5/5 Hip abd: 5/5 Quad: 5/5 VMO atrophy: mild-mod Hamstring concentric and eccentric: 5/5   Assessment and Plan: Bilateral knee osteoarthritis, LEFT greater than RIGHT.  Reviewed basic rehabilitation program, encouraged riding a bicycle. Ice her knees in the evening after work. Continue with Naprosyn up to 2 tablets p.o. B.i.d. As well as Tylenol. She can also try some glucosamine/chondroitin supplementation.  Refer to the patient instructions sections for details of plan shared with patient.   Her knees are limiting her somewhat right now, and she has not had a prior injection. She has never had any hyaluronic acid previously. Trial of LEFT knee injection, and she does well, she'll follow up in the next few weeks and we can inject her RIGHT knee. If minimal improvement, would not reinject.  Knee Injection, L Patient verbally consented to procedure. Risks (including potential rare risk of infection), benefits, and alternatives explained. Sterilely prepped with Chloraprep. Ethyl cholride used for anesthesia. 9 cc Lidocaine 1% mixed with 1 cc of Depo-Medrol 40 mg injected using the anterolateral approach without difficulty. No complications with procedure  and tolerated well. Patient had decreased pain post-injection.

## 2011-08-21 ENCOUNTER — Ambulatory Visit (INDEPENDENT_AMBULATORY_CARE_PROVIDER_SITE_OTHER): Payer: BC Managed Care – PPO | Admitting: Family Medicine

## 2011-08-21 ENCOUNTER — Telehealth: Payer: Self-pay | Admitting: Family Medicine

## 2011-08-21 ENCOUNTER — Encounter: Payer: Self-pay | Admitting: Family Medicine

## 2011-08-21 DIAGNOSIS — R21 Rash and other nonspecific skin eruption: Secondary | ICD-10-CM

## 2011-08-21 NOTE — Progress Notes (Signed)
Cortisone injection yesterday in L knee.  Tolerated well.  She felt well last night and slept well and didn't have pain.  This AM she was red from waist up.  Mildly itchy and her skin has a prickly sensation.  No tongue swelling, lip swelling.  No voice changes.  Not SOB, no wheeze. No cough.  No vomiting.  No presyncope.  No dysphagia.    Injection was 9 cc Lidocaine 1% mixed with 1 cc of Depo-Medrol 40 mg.  Also ate licorice yesterday, but this isn't a new food.  No other new foods, soap, detergent.  No other new trigger known.  Never had an injection of steroids prev, but had tolerated oral steroids prev.  H/o dental work, but no intolerance to local/dental anesthetics prev.    Has taken allergra today, 180mg . No reaction at the L knee.  Her L knee still feels better.  She does feel like she has more energy, likely from the steroids.    Meds, vitals, and allergies reviewed.   ROS: See HPI.  Otherwise, noncontributory.  nad ncat Mmm, no tongue or lip edema Neck supply, no stridor rrr ctab Diffuse mild red blanching rash on the trunk but not on palms No oral lesions.

## 2011-08-21 NOTE — Telephone Encounter (Signed)
Dr Alphonsus Sias will see pt today at 4:45pm.Patient notified as instructed by telephone.Pt appreciated and she said she would be here by 4:30pm with the knowledge she may have to wait.This note forwarded to Dr Patsy Lager.

## 2011-08-21 NOTE — Telephone Encounter (Signed)
Dr Patsy Lager needs to see if he has not already Also likely needs to be seen by [facility]  I am not in office today

## 2011-08-21 NOTE — Patient Instructions (Signed)
If you have any of the following- tongue or lip swelling, trouble breathing or swallowing- then go to the ER or dial 911.   Take allegra 180mg  a day and pepcid 20mg  twice a day for a week.  Take care.

## 2011-08-21 NOTE — Telephone Encounter (Signed)
Triage Record Num: [id number] Operator: Di Kindle Patient Name: Michele Cruz Call Date & Time: 08/21/2011 12:23:42PM Patient Phone: [phone] PCP: Hannah Beat Patient Gender: Female PCP Fax : [phone] Patient DOB: 1956/02/14 Practice Name: [facility] Reason for Call: OFFICE PLEASE Caller: [name]/Patient is calling with a question about Prednisone given by injection in office 08/20/11 for arthristis symptoms in knee.The medication was written by Copland, Karleen Hampshire T. Reports onset 08/21/11 of itchy rash on torso,arms up to neck, afebrile. REd All over, looks like a "lobster" Symptoms reviewed Allergic Reaction, denies other emergent symptoms with redness, Disp: Call within 4 hrs. Please call pt with recommendations. Call on cell [phone]. OFFICE PLEASE Protocol(s) Used: Allergic Reaction, Severe Recommended Outcome per Protocol: Call Provider within 4 Hours Reason for Outcome: First occurrence of mild allergic reaction (rash; hives; itching; nasal congestion; watery, red eyes) that occurs within minutes to several hours after exposure to an allergen AND without any other signs or symptoms of anaphylaxis. Care Advice: ~ 08/21/2011 12:32:31PM Page 1 of 1 [email or web address]

## 2011-08-21 NOTE — Telephone Encounter (Signed)
Her phone blocks my cell phone. Chart reviewed, and Dr. Para March eval patient this afternoon. Sounds stable, systemic allergy - on Allegra and Pepcid. I completely agree.  May be a reaction to preservative to preservative in Depo-medrol, but would not consider an allergy to all injectables, and would not exclude future kenalog, decadron, celestone inj.   Cc: Dr. Milinda Antis

## 2011-08-21 NOTE — Telephone Encounter (Signed)
Attempted patient call - no answer. Pt with appointment today. Will continue to try to speak to her personally to check on her. A systemic allergic response is possible but uncommon from the preservative in depo-medrol. Would be highly unusual response to lidocaine.

## 2011-08-22 DIAGNOSIS — R21 Rash and other nonspecific skin eruption: Secondary | ICD-10-CM | POA: Insufficient documentation

## 2011-08-22 NOTE — Assessment & Plan Note (Signed)
Likely related to injection, though her knee is improved. Use allegra and pepcid for 1 week.  I told her this may not be a true allergy, but I'd list it as one for now.  Will forward to Dr. Patsy Lager and Dr. Milinda Antis to review and address the allergy label.  Nontoxic. She understood.

## 2011-10-20 ENCOUNTER — Encounter: Payer: Self-pay | Admitting: Internal Medicine

## 2011-12-08 ENCOUNTER — Encounter: Payer: Self-pay | Admitting: Internal Medicine

## 2012-01-09 ENCOUNTER — Ambulatory Visit
Admission: RE | Admit: 2012-01-09 | Discharge: 2012-01-09 | Disposition: A | Discharge status: Home / Self Care | Payer: BC Managed Care – PPO | Source: Ambulatory Visit | Attending: Family Medicine | Admitting: Family Medicine

## 2012-01-09 DIAGNOSIS — Z1231 Encounter for screening mammogram for malignant neoplasm of breast: Secondary | ICD-10-CM

## 2012-01-12 ENCOUNTER — Ambulatory Visit (AMBULATORY_SURGERY_CENTER): Payer: BC Managed Care – PPO | Admitting: *Deleted

## 2012-01-12 VITALS — Ht 67.5 in | Wt 196.6 lb

## 2012-01-12 DIAGNOSIS — Z86010 Personal history of colonic polyps: Secondary | ICD-10-CM

## 2012-01-12 DIAGNOSIS — Z1211 Encounter for screening for malignant neoplasm of colon: Secondary | ICD-10-CM

## 2012-01-12 MED ORDER — MOVIPREP 100 G PO SOLR: ORAL | Status: DC

## 2012-01-26 ENCOUNTER — Ambulatory Visit (AMBULATORY_SURGERY_CENTER): Payer: BC Managed Care – PPO | Admitting: Internal Medicine

## 2012-01-26 ENCOUNTER — Encounter: Payer: Self-pay | Admitting: Internal Medicine

## 2012-01-26 VITALS — BP 123/52 | HR 85 | Temp 96.1°F | Resp 18 | Ht 67.5 in | Wt 196.0 lb

## 2012-01-26 DIAGNOSIS — K648 Other hemorrhoids: Secondary | ICD-10-CM

## 2012-01-26 DIAGNOSIS — Z86010 Personal history of colonic polyps: Secondary | ICD-10-CM

## 2012-01-26 DIAGNOSIS — Z1211 Encounter for screening for malignant neoplasm of colon: Secondary | ICD-10-CM

## 2012-01-26 MED ORDER — HYDROCORTISONE 2.5 % RE CREA: TOPICAL_CREAM | Freq: Two times a day (BID) | RECTAL | Status: AC | PRN

## 2012-01-26 MED ORDER — SODIUM CHLORIDE 0.9 % IV SOLN: 500.0000 mL | INTRAVENOUS | Status: DC

## 2012-01-26 NOTE — Patient Instructions (Addendum)
No polyps today! You do have some small internal hemorrhoids.  Guidelines would indicate to have another routine colonoscopy in 5-10 years, I will recommend you think about in 7 and plan to send a reminder letter then (2020).  A prescription for hemorrhoid cream was sent to your pharmacy.  Thank you for choosing me and [facility].  Iva Boop, MD, FACG   YOU HAD AN ENDOSCOPIC PROCEDURE TODAY AT THE [facility]: Refer to the procedure report that was given to you for any specific questions about what was found during the examination.  If the procedure report does not answer your questions, please call your gastroenterologist to clarify.  If you requested that your care partner not be given the details of your procedure findings, then the procedure report has been included in a sealed envelope for you to review at your convenience later.  YOU SHOULD EXPECT: Some feelings of bloating in the abdomen. Passage of more gas than usual.  Walking can help get rid of the air that was put into your GI tract during the procedure and reduce the bloating. If you had a lower endoscopy (such as a colonoscopy or flexible sigmoidoscopy) you may notice spotting of blood in your stool or on the toilet paper. If you underwent a bowel prep for your procedure, then you may not have a normal bowel movement for a few days.  DIET: Your first meal following the procedure should be a light meal and then it is ok to progress to your normal diet.  A half-sandwich or bowl of soup is an example of a good first meal.  Heavy or fried foods are harder to digest and may make you feel nauseous or bloated.  Likewise meals heavy in dairy and vegetables can cause extra gas to form and this can also increase the bloating.  Drink plenty of fluids but you should avoid alcoholic beverages for 24 hours.  ACTIVITY: Your care partner should take you home directly after the procedure.  You should plan to take it  easy, moving slowly for the rest of the day.  You can resume normal activity the day after the procedure however you should NOT DRIVE or use heavy machinery for 24 hours (because of the sedation medicines used during the test).    SYMPTOMS TO REPORT IMMEDIATELY: A gastroenterologist can be reached at any hour.  During normal business hours, 8:30 AM to 5:00 PM Monday through Friday, call [phone].  After hours and on weekends, please call the GI answering service at [phone] who will take a message and have the physician on call contact you.   Following lower endoscopy (colonoscopy or flexible sigmoidoscopy):  Excessive amounts of blood in the stool  Significant tenderness or worsening of abdominal pains  Swelling of the abdomen that is new, acute  Fever of 100F or higher  Following upper endoscopy (EGD)  Vomiting of blood or coffee ground material  New chest pain or pain under the shoulder blades  Painful or persistently difficult swallowing  New shortness of breath  Fever of 100F or higher  Black, tarry-looking stools  FOLLOW UP: If any biopsies were taken you will be contacted by phone or by letter within the next 1-3 weeks.  Call your gastroenterologist if you have not heard about the biopsies in 3 weeks.  Our staff will call the home number listed on your records the next business day following your procedure to check on you and address any questions  or concerns that you may have at that time regarding the information given to you following your procedure. This is a courtesy call and so if there is no answer at the home number and we have not heard from you through the emergency physician on call, we will assume that you have returned to your regular daily activities without incident.  SIGNATURES/CONFIDENTIALITY: You and/or your care partner have signed paperwork which will be entered into your electronic medical record.  These signatures attest to the fact that that the  information above on your After Visit Summary has been reviewed and is understood.  Full responsibility of the confidentiality of this discharge information lies with you and/or your care-partner.   Hemorrhoid information sheet given

## 2012-01-26 NOTE — Op Note (Signed)
[facility] [address] [place], [place]  [address]  COLONOSCOPY PROCEDURE REPORT  PATIENT:  Michele, Cruz  MR#:  [id number] BIRTHDATE:  03-23-1956, 56 yrs. old  GENDER:  female ENDOSCOPIST:  Iva Boop, MD, [facility]  PROCEDURE DATE:  01/26/2012 PROCEDURE:  Colonoscopy 19147 ASA CLASS:  Class II INDICATIONS:  surveillance and high-risk screening, history of pre-cancerous (adenomatous) colon polyps 8 mm adenoma removed 2008  MEDICATIONS:   These medications were titrated to patient response per physician's verbal order, MAC sedation, administered by CRNA, propofol (Diprivan) 240 mg IV  DESCRIPTION OF PROCEDURE:   After the risks benefits and alternatives of the procedure were thoroughly explained, informed consent was obtained.  Digital rectal exam was performed and revealed no abnormalities.   The LB CF-[id number] endoscope was introduced through the anus and advanced to the cecum, which was identified by both the appendix and ileocecal valve, without limitations.  The quality of the prep was excellent, using MoviPrep.  The instrument was then slowly withdrawn as the colon was fully examined. <<PROCEDUREIMAGES>>  FINDINGS:  A normal appearing cecum, ileocecal valve, and appendiceal orifice were identified. The ascending, hepatic flexure, transverse, splenic flexure, descending, sigmoid colon, and rectum appeared unremarkable.   Retroflexed views in the rectum revealed internal hemorrhoids.    The time to cecum = 5:39 minutes. The scope was then withdrawn in 7:36 minutes from the cecum and the procedure completed. COMPLICATIONS:  None ENDOSCOPIC IMPRESSION: 1) Normal colon 2) Internal hemorrhoids 3) Personal history of adenoma 2008  REPEAT EXAM:  In 7 year(s) for routine screening colonoscopy. 2020  Iva Boop, MD, [name]  CC:  The Patient  n. eSIGNED:   Iva Boop at 01/26/2012 11:06 AM  [place], Clydie Braun, [phone]

## 2012-01-26 NOTE — Progress Notes (Signed)
Patient did not experience any of the following events: a burn prior to discharge; a fall within the facility; wrong site/side/patient/procedure/implant event; or a hospital transfer or hospital admission upon discharge from the facility. (G8907) Patient did not have preoperative order for IV antibiotic SSI prophylaxis. (G8918)  

## 2012-01-27 ENCOUNTER — Telehealth: Payer: Self-pay | Admitting: *Deleted

## 2012-01-27 NOTE — Telephone Encounter (Signed)
  Follow up Call-  Call back number 01/26/2012  Post procedure Call Back phone  # [phone]  Permission to leave phone message Yes     Patient questions:  Left message to call if necessary.

## 2012-06-02 ENCOUNTER — Other Ambulatory Visit: Payer: Self-pay | Admitting: *Deleted

## 2012-06-02 NOTE — Telephone Encounter (Signed)
Left voicemail requesting pt to call office, will try to call later 

## 2012-06-02 NOTE — Telephone Encounter (Signed)
Please schedule PE if not already done and refil until then, thanks

## 2012-06-03 ENCOUNTER — Other Ambulatory Visit: Payer: Self-pay | Admitting: *Deleted

## 2012-06-03 NOTE — Telephone Encounter (Signed)
Left voicemail on cell # requesting pt to call office, will try to call back later

## 2012-06-04 MED ORDER — BUSPIRONE HCL 15 MG PO TABS: 7.5000 mg | ORAL_TABLET | Freq: Every day | ORAL | Status: DC

## 2012-06-04 NOTE — Telephone Encounter (Signed)
CPE scheduled and meds refilled  

## 2012-06-04 NOTE — Telephone Encounter (Signed)
Pt left v/m returning call. Call pt [phone].

## 2012-06-10 ENCOUNTER — Telehealth: Payer: Self-pay | Admitting: Family Medicine

## 2012-06-10 DIAGNOSIS — Z0000 Encounter for general adult medical examination without abnormal findings: Secondary | ICD-10-CM

## 2012-06-10 DIAGNOSIS — E7800 Pure hypercholesterolemia, unspecified: Secondary | ICD-10-CM

## 2012-06-10 NOTE — Telephone Encounter (Signed)
Message copied by Judy Pimple on Thu Jun 10, 2012 10:21 PM ------      Message from: Baldomero Lamy      Created: Tue Jun 08, 2012 11:57 AM      Regarding: Cpx labs Fri 12/20       Please order  future cpx labs for pt's upcoming lab appt.      Thanks      Rodney Booze

## 2012-06-11 ENCOUNTER — Other Ambulatory Visit: Payer: BC Managed Care – PPO

## 2012-06-14 ENCOUNTER — Other Ambulatory Visit (INDEPENDENT_AMBULATORY_CARE_PROVIDER_SITE_OTHER): Payer: BC Managed Care – PPO

## 2012-06-14 ENCOUNTER — Ambulatory Visit (INDEPENDENT_AMBULATORY_CARE_PROVIDER_SITE_OTHER): Payer: BC Managed Care – PPO | Admitting: Family Medicine

## 2012-06-14 ENCOUNTER — Encounter: Payer: Self-pay | Admitting: Family Medicine

## 2012-06-14 VITALS — BP 125/80 | HR 66 | Temp 98.2°F | Ht 67.0 in | Wt 189.5 lb

## 2012-06-14 DIAGNOSIS — E7800 Pure hypercholesterolemia, unspecified: Secondary | ICD-10-CM

## 2012-06-14 DIAGNOSIS — Z0000 Encounter for general adult medical examination without abnormal findings: Secondary | ICD-10-CM

## 2012-06-14 LAB — COMPREHENSIVE METABOLIC PANEL
ALT: 16 U/L (ref 0–35)
BUN: 20 mg/dL (ref 6–23)
CO2: 27 mEq/L (ref 19–32)
Calcium: 9.5 mg/dL (ref 8.4–10.5)
Chloride: 104 mEq/L (ref 96–112)
Creatinine, Ser: 0.7 mg/dL (ref 0.4–1.2)
GFR: 89.08 mL/min (ref >60.00)
Total Bilirubin: 0.8 mg/dL (ref 0.3–1.2)

## 2012-06-14 LAB — CBC WITH DIFFERENTIAL/PLATELET
Basophils Absolute: 0 10*3/uL (ref 0.0–0.1)
Basophils Relative: 0.8 % (ref 0.0–3.0)
Eosinophils Absolute: 0.2 10*3/uL (ref 0.0–0.7)
HCT: 37.5 % (ref 36.0–46.0)
Hemoglobin: 12.9 g/dL (ref 12.0–15.0)
Lymphocytes Relative: 31.4 % (ref 12.0–46.0)
Lymphs Abs: 1.4 10*3/uL (ref 0.7–4.0)
MCHC: 34.4 g/dL (ref 30.0–36.0)
Monocytes Relative: 8.9 % (ref 3.0–12.0)
Neutro Abs: 2.5 10*3/uL (ref 1.4–7.7)
RBC: 4.24 Mil/uL (ref 3.87–5.11)
RDW: 13.4 % (ref 11.5–14.6)

## 2012-06-14 LAB — LIPID PANEL
HDL: 55.8 mg/dL (ref >39.00)
Triglycerides: 52 mg/dL (ref 0.0–149.0)
VLDL: 10.4 mg/dL (ref 0.0–40.0)

## 2012-06-14 MED ORDER — BUSPIRONE HCL 15 MG PO TABS: 7.5000 mg | ORAL_TABLET | Freq: Every day | ORAL | Status: DC

## 2012-06-14 NOTE — Assessment & Plan Note (Signed)
Reviewed health habits including diet and exercise and skin cancer prevention Also reviewed health mt list, fam hx and immunizations   Reviewed wellness labs Commended on wt loss

## 2012-06-14 NOTE — Assessment & Plan Note (Signed)
This is improved with better diet and eating less in general Disc goals for lipids and reasons to control them Rev labs with pt Rev low sat fat diet in detail

## 2012-06-14 NOTE — Progress Notes (Signed)
Subjective:    Patient ID: Michele Cruz, female    DOB: 18-May-1956, 56 y.o.   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 -eating less    Flu vaccine- did get it at [facility] in dec early    Pap 2/11 No hx of abn paps  No gyn symptoms   mammo 7/13 Self exam - no lumps or changes   colonosc 8/13 Is glad to get that over with   Mood -is overall very good - lot of energy   Lab Results  Component Value Date   CHOL 188 06/14/2012   CHOL 219* 03/27/2011   CHOL 150 07/03/2010   Lab Results  Component Value Date   HDL 55.80 06/14/2012   HDL 62.10 03/27/2011   HDL 95.62 07/03/2010   Lab Results  Component Value Date   LDLCALC 122* 06/14/2012   LDLCALC 86 07/03/2010   LDLCALC 125* 01/07/2008   Lab Results  Component Value Date   TRIG 52.0 06/14/2012   TRIG 49.0 03/27/2011   TRIG 41.0 07/03/2010   Lab Results  Component Value Date   CHOLHDL 3 06/14/2012   CHOLHDL 4 03/27/2011   CHOLHDL 3 07/03/2010   Lab Results  Component Value Date   LDLDIRECT 152.4 03/27/2011   LDLDIRECT 150.6 03/20/2010   LDLDIRECT 150.4 12/11/2009   cholesterol profile is overall better considering no med Eating almonds and fruit a lot     Review of Systems Review of Systems  Constitutional: Negative for fever, appetite change, fatigue and unexpected weight change.  Eyes: Negative for pain and visual disturbance.  Respiratory: Negative for cough and shortness of breath.   Cardiovascular: Negative for cp or palpitations    Gastrointestinal: Negative for nausea, diarrhea and constipation.  Genitourinary: Negative for urgency and frequency.  Skin: Negative for pallor  or rash   MSK pos for knee/ leg pain after an injury Neurological: Negative for weakness, light-headedness, numbness and headaches.  Hematological: Negative for adenopathy. Does not bruise/bleed easily.  Psychiatric/Behavioral: Negative for dysphoric mood. The patient is not nervous/anxious.         Objective:   Physical Exam  Constitutional: She appears well-developed and well-nourished. No distress.  HENT:  Head: Normocephalic and atraumatic.  Right Ear: External ear normal.  Left Ear: External ear normal.  Nose: Nose normal.  Mouth/Throat: Oropharynx is clear and moist.  Eyes: Conjunctivae normal and EOM are normal. Pupils are equal, round, and reactive to light. Right eye exhibits no discharge. Left eye exhibits no discharge. No scleral icterus.  Neck: Normal range of motion. Neck supple. No JVD present. Carotid bruit is not present. No thyromegaly present.  Cardiovascular: Normal rate, regular rhythm, normal heart sounds and intact distal pulses.  Exam reveals no gallop.   Pulmonary/Chest: Effort normal and breath sounds normal. No respiratory distress. She has no wheezes.  Abdominal: Soft. Bowel sounds are normal. She exhibits no distension, no abdominal bruit and no mass. There is no tenderness.  Genitourinary: No breast swelling, tenderness, discharge or bleeding.       Breast exam: No mass, nodules, thickening, tenderness, bulging,  retraction, inflamation, nipple discharge or skin changes noted.  No axillary or clavicular LA.  Chaperoned exam.    Musculoskeletal: She exhibits no edema and no tenderness.  Lymphadenopathy:    She has no cervical adenopathy.  Neurological: She is alert. She has normal reflexes. No cranial nerve deficit. She exhibits normal muscle tone. Coordination normal.  Skin: Skin is warm and dry. No rash noted. No erythema. No pallor.  Psychiatric: She has a normal mood and affect.          Assessment & Plan:

## 2012-06-14 NOTE — Patient Instructions (Addendum)
Take care of yourself Labs look good and cholesterol is improved Continue eating a healthy diet  Make appt with Dr Patsy Lager on the way out for knee pain

## 2012-06-21 ENCOUNTER — Ambulatory Visit (INDEPENDENT_AMBULATORY_CARE_PROVIDER_SITE_OTHER)
Admission: RE | Admit: 2012-06-21 | Discharge: 2012-06-21 | Disposition: A | Discharge status: Home / Self Care | Payer: BC Managed Care – PPO | Source: Ambulatory Visit | Attending: Family Medicine | Admitting: Family Medicine

## 2012-06-21 ENCOUNTER — Ambulatory Visit (INDEPENDENT_AMBULATORY_CARE_PROVIDER_SITE_OTHER): Payer: BC Managed Care – PPO | Admitting: Family Medicine

## 2012-06-21 ENCOUNTER — Encounter: Payer: Self-pay | Admitting: Family Medicine

## 2012-06-21 VITALS — BP 106/70 | HR 80 | Temp 98.2°F | Ht 67.0 in | Wt 191.2 lb

## 2012-06-21 DIAGNOSIS — M1710 Unilateral primary osteoarthritis, unspecified knee: Secondary | ICD-10-CM

## 2012-06-21 DIAGNOSIS — M25569 Pain in unspecified knee: Secondary | ICD-10-CM

## 2012-06-21 DIAGNOSIS — M1711 Unilateral primary osteoarthritis, right knee: Secondary | ICD-10-CM | POA: Insufficient documentation

## 2012-06-21 DIAGNOSIS — M25561 Pain in right knee: Secondary | ICD-10-CM

## 2012-06-21 NOTE — Progress Notes (Signed)
Nature conservation officer at [facility] [address] [place] [address] Phone: [phone] Fax: [phone]  Date:  06/21/2012   Name:  Michele Cruz   DOB:  1955-11-20   MRN:  [id number] Gender: female Age: 56 y.o.  PCP:  Roxy Manns, MD  Evaluating MD: Hannah Beat, MD   Chief Complaint: Knee Pain   History of Present Illness:  Michele Cruz is a 56 y.o. pleasant patient who presents with the following:  Pleasant patient known well with known advanced DJD of the B knees presents after acute twisting injury of the R knee 2 weeks ago, then know with a lot more difficulty walking, more pain. No redness or warmth. No other known trauma. Feeling a little bit better compared to initially, but still great difficulty walking.  2 weeks ago, final week of school, and foot caught on the right, and laster that night, and had to use some crutches. This is new and different.   Alleve for a week.   Patient Active Problem List  Diagnosis  . HYPERCHOLESTEROLEMIA  . ANXIETY  . POSTCONCUSSION SYNDROME  . ALLERGIC RHINITIS  . ROSACEA  . HERNIATED CERVICAL DISC  . BACK PAIN, LUMBAR, WITH RADICULOPATHY  . CARDIAC MURMUR  . COLONIC POLYPS, HX OF  . Routine general medical examination at a health care facility  . Knee pain  . Tick bites  . Other screening mammogram  . Rash    Past Medical History  Diagnosis Date  . Allergic rhinitis   . Anemia, iron deficiency   . Anxiety   . Heavy menses   . Rosacea   . Zoster   . Arthritis   . Colon polyps   . Spondylosis     deg lumbar disc dz  . History of exercise stress test 06/1999    neg  . History of MRI 2003    neg per patient  . History of MRI of spine 03/2004    C-5, C5-6 disk protrusion  . History of MRI 2007    right quad fatty defect  . Asthma     no symptoms last 25 years  . Hyperlipidemia     LDL  . Hemorrhoids     Past Surgical History  Procedure Date  . Cesarean section   . Knee surgery     left  .  Breast cyst aspiration 03/2002  . Breast cyst aspiration 03/2004    left  . Colonoscopy     History  Substance Use Topics  . Smoking status: Never Smoker   . Smokeless tobacco: Never Used  . Alcohol Use: 1.2 oz/week    2 Glasses of wine per week     Comment: 2-3 glasses wine or beer weekly per pt.    Family History  Problem Relation Age of Onset  . Arthritis Mother     OA  . Obesity Mother   . Heart disease Father     CAD  . Alcohol abuse Father   . Diabetes Father   . Hyperlipidemia Sister   . Diabetes Other   . Colon cancer Neg Hx     Allergies  Allergen Reactions  . Lipitor (Atorvastatin Calcium) Other (See Comments)    Leg cramps and pain  . Depo-Medrol (Methylprednisolone Acetate)     Rash after injection, a noted common SE from depo-medrol listed in epocrates and most resources  . Meloxicam     Pt felt dizzy potentially from this, but does well on aleve  .  Simvastatin     Possible transient memory changes, resolved off medicine    Medication list has been reviewed and updated.  Outpatient Prescriptions Prior to Visit  Medication Sig Dispense Refill  . Acetaminophen (TYLENOL ARTHRITIS PAIN PO) Take 2 tablets by mouth daily.      . Ascorbic Acid (VITAMIN C) 100 MG tablet Take 100 mg by mouth daily.        Marland Kitchen aspirin 81 MG tablet Take 81 mg by mouth daily.        . Azelaic Acid (FINACEA) 15 % cream Topical cream for rosacea on face, use as directed       . busPIRone (BUSPAR) 15 MG tablet Take 0.5 tablets (7.5 mg total) by mouth at bedtime. Take 1/2 by mouth at bedtime  45 tablet  3  . Ca Phosphate-Cholecalciferol (CALCIUM/VITAMIN D3 GUMMIES) 200-200 MG-UNIT CHEW Chew 1 tablet by mouth 2 (two) times daily.      Marland Kitchen loratadine (CLARITIN) 10 MG tablet Take 10 mg by mouth daily.      . magnesium oxide (MAG-OX) 400 MG tablet Take 400 mg by mouth daily.        . Omega-3 Fatty Acids (FISH OIL) 1200 MG CAPS Take 1 capsule by mouth daily.       . naproxen sodium (ANAPROX)  220 MG tablet Take 220 mg by mouth 2 (two) times daily with a meal.       Last reviewed on 06/21/2012  8:14 AM by Yetta Glassman, LPN  Review of Systems:   GEN: No fevers, chills. Nontoxic. Primarily MSK c/o today. MSK: Detailed in the HPI GI: tolerating PO intake without difficulty Neuro: No numbness, parasthesias, or tingling associated. Otherwise the pertinent positives of the ROS are noted above.    Physical Examination: BP 106/70  Pulse 80  Temp 98.2 F (36.8 C) (Oral)  Ht 5\' 7"  (1.702 m)  Wt 191 lb 4 oz (86.75 kg)  BMI 29.95 kg/m2  Ideal Body Weight: Weight in (lb) to have BMI = 25: 159.3    GEN: WDWN, NAD, Non-toxic, Alert & Oriented x 3 HEENT: Atraumatic, Normocephalic.  Ears and Nose: No external deformity. EXTR: No clubbing/cyanosis/edema NEURO: antalgic gait PSYCH: Normally interactive. Conversant. Not depressed or anxious appearing.  Calm demeanor.   Knee R, loss of 4 deg ext, flexion to 85. L loss of 3 deg ext and flexion to 100. Tender medial joint lines. Stable MCL, ACL, LCL, PCL. Pos flexion pinch and pain on mcmurrays.  Assessment and Plan:  1. Right knee pain  DG Knee AP/LAT W/Sunrise Right  2. Osteoarthritis of knee     X-rays: AP Bilateral Weight-bearing, Weightbearing Lateral, Sunrise views Indication: knee pain Findings:  Tricompartmental OA changes, no loose body, complete loss of joint space at medial compartment.  OA exac  Steroids now, then hyaluronic acid injections  Knee Injection, RIGHT Patient verbally consented to procedure. Risks (including potential rare risk of infection), benefits, and alternatives explained. Sterilely prepped with Chloraprep. Ethyl cholride used for anesthesia. 4 cc Lidocaine 1% mixed with Decadron 4 mg mg injected using the anterolateral approach without difficulty. No complications with procedure and tolerated well. Patient had decreased pain post-injection.   Knee Injection, LEFT Patient verbally consented  to procedure. Risks (including potential rare risk of infection), benefits, and alternatives explained. Sterilely prepped with Chloraprep. Ethyl cholride used for anesthesia. 4 cc Lidocaine 1% mixed with Decadron 4 mg injected using the anterolateral approach without difficulty. No complications with procedure and tolerated  well. Patient had decreased pain post-injection.   Orders Today:  Orders Placed This Encounter  Procedures  . DG Knee AP/LAT W/Sunrise Right    Standing Status: Future     Number of Occurrences: 1     Standing Expiration Date: 08/21/2013    Order Specific Question:  Reason for exam:    Answer:  acute pain    Order Specific Question:  Is the patient pregnant?    Answer:  No    Order Specific Question:  Preferred imaging location?    Answer:  [facility]    Updated Medication List: (Includes new medications, updates to list, dose adjustments) No orders of the defined types were placed in this encounter.    Medications Discontinued: There are no discontinued medications.   Hannah Beat, MD

## 2012-06-28 ENCOUNTER — Telehealth: Payer: Self-pay | Admitting: Family Medicine

## 2012-06-28 NOTE — Telephone Encounter (Signed)
Herbert Seta, can you check with this patient - the synvisc injections we talked about should be covered by [employer]. I would suggest doing 1 on 1 day and doing the 2nd on a different day to ensure coverage.   We injected her knees last week, I would get her to call in when they are bothering her again, and we can order synvisc and usually get it in quickly.   Hannah Beat, MD 06/28/2012, 5:55 PM

## 2012-07-15 ENCOUNTER — Encounter: Payer: Self-pay | Admitting: *Deleted

## 2012-07-15 NOTE — Telephone Encounter (Signed)
I have tried multiple times to contact patient and left her messages that have not been returned. So I have mailed a letter advising her to contact us if she would like to do those injections.

## 2013-01-10 ENCOUNTER — Ambulatory Visit (INDEPENDENT_AMBULATORY_CARE_PROVIDER_SITE_OTHER): Payer: BC Managed Care – PPO | Admitting: Family Medicine

## 2013-01-10 ENCOUNTER — Encounter: Payer: Self-pay | Admitting: Family Medicine

## 2013-01-10 VITALS — BP 98/62 | HR 60 | Temp 98.7°F | Wt 190.0 lb

## 2013-01-10 DIAGNOSIS — M175 Other unilateral secondary osteoarthritis of knee: Secondary | ICD-10-CM

## 2013-01-10 DIAGNOSIS — M174 Other bilateral secondary osteoarthritis of knee: Secondary | ICD-10-CM

## 2013-01-10 NOTE — Progress Notes (Deleted)
   Nature conservation officer at [facility] [address] [place] [address] Phone: [phone] Fax: [phone]  Date:  01/10/2013   Name:  Michele Cruz   DOB:  1955-07-07   MRN:  [id number] Gender: female Age: 57 y.o.  Primary Physician:  Roxy Manns, MD  Evaluating MD: Hannah Beat, MD  Chief Complaint: bilateral knee pain   History of Present Illness:  Michele Cruz is a 57 y.o. very pleasant female patient who presents with the following:  B knee injection:  Past Medical History, Surgical History, Social History, Family History, Problem List, Medications, and Allergies have been reviewed and updated if relevant.  Current Outpatient Prescriptions on File Prior to Visit  Medication Sig Dispense Refill  . Ascorbic Acid (VITAMIN C) 100 MG tablet Take 100 mg by mouth daily.        . Azelaic Acid (FINACEA) 15 % cream Topical cream for rosacea on face, use as directed       . busPIRone (BUSPAR) 15 MG tablet Take 0.5 tablets (7.5 mg total) by mouth at bedtime. Take 1/2 by mouth at bedtime  45 tablet  3  . Ca Phosphate-Cholecalciferol (CALCIUM/VITAMIN D3 GUMMIES) 200-200 MG-UNIT CHEW Chew 1 tablet by mouth 2 (two) times daily.      Marland Kitchen loratadine (CLARITIN) 10 MG tablet Take 10 mg by mouth daily.      . magnesium oxide (MAG-OX) 400 MG tablet Take 400 mg by mouth daily.        . Omega-3 Fatty Acids (FISH OIL) 1200 MG CAPS Take 1 capsule by mouth daily.        No current facility-administered medications on file prior to visit.    Review of Systems: ***  Physical Examination: BP 98/62  Pulse 60  Temp(Src) 98.7 F (37.1 C)  Wt 190 lb (86.183 kg)  BMI 29.75 kg/m2  ***  Assessment and Plan: ***  Signed, Keyshaun Exley T. Moroni Nester, MD 01/10/2013 1:07 PM

## 2013-01-10 NOTE — Progress Notes (Signed)
Nature conservation officer at [facility] [address] [place] [address] Phone: [phone] Fax: [phone]  Date:  01/10/2013   Name:  Michele Cruz   DOB:  [birth year]   MRN:  [id number] Gender: female Age: 57 y.o.  Primary Physician:  Roxy Manns, MD  Evaluating MD: Hannah Beat, MD  Chief Complaint: bilateral knee pain   History of Present Illness:  Michele Cruz is a 57 y.o. very pleasant female patient who presents with the following:  B knee injection:  Longstanding DJD of knees B, clinically and radiographically. Tolerated Decadron last OV. 8 month response to steroids.  Past Medical History, Surgical History, Social History, Family History, Problem List, Medications, and Allergies have been reviewed and updated if relevant.  Current Outpatient Prescriptions on File Prior to Visit  Medication Sig Dispense Refill  . Ascorbic Acid (VITAMIN C) 100 MG tablet Take 100 mg by mouth daily.        . Azelaic Acid (FINACEA) 15 % cream Topical cream for rosacea on face, use as directed       . busPIRone (BUSPAR) 15 MG tablet Take 0.5 tablets (7.5 mg total) by mouth at bedtime. Take 1/2 by mouth at bedtime  45 tablet  3  . Ca Phosphate-Cholecalciferol (CALCIUM/VITAMIN D3 GUMMIES) 200-200 MG-UNIT CHEW Chew 1 tablet by mouth 2 (two) times daily.      Marland Kitchen loratadine (CLARITIN) 10 MG tablet Take 10 mg by mouth daily.      . magnesium oxide (MAG-OX) 400 MG tablet Take 400 mg by mouth daily.        . Omega-3 Fatty Acids (FISH OIL) 1200 MG CAPS Take 1 capsule by mouth daily.        No current facility-administered medications on file prior to visit.    Review of Systems:  GEN: No fevers, chills. Nontoxic. Primarily MSK c/o today. MSK: Detailed in the HPI GI: tolerating PO intake without difficulty Neuro: No numbness, parasthesias, or tingling associated. Otherwise the pertinent positives of the ROS are noted above.    Physical Examination: BP 98/62  Pulse 60  Temp(Src)  98.7 F (37.1 C)  Wt 190 lb (86.183 kg)  BMI 29.75 kg/m2  GEN: WDWN, NAD, Non-toxic, Alert & Oriented x 3 HEENT: Atraumatic, Normocephalic.  Ears and Nose: No external deformity. EXTR: No clubbing/cyanosis/edema NEURO: antalgic gait PSYCH: Normally interactive. Conversant. Not depressed or anxious appearing.  Calm demeanor.   Knee B, loss of 4 deg ext, flexion to 85. L loss of 3 deg ext and flexion to 100. Tender medial joint lines. Stable MCL, ACL, LCL, PCL. Pos flexion pinch and pain on mcmurrays.  Assessment and Plan: Other bilateral secondary osteoarthritis of knee   Knee Injection, R Patient verbally consented to procedure. Risks (including potential rare risk of infection), benefits, and alternatives explained. Sterilely prepped with Chloraprep. Ethyl cholride used for anesthesia. 4 cc Lidocaine 1% mixed with 1/2 cc of Decadron 10 mg (dose = 5 mg) injected using the anterolateral approach without difficulty. No complications with procedure and tolerated well. Patient had decreased pain post-injection.   Knee Injection, L Patient verbally consented to procedure. Risks (including potential rare risk of infection), benefits, and alternatives explained. Sterilely prepped with Chloraprep. Ethyl cholride used for anesthesia. 4 cc Lidocaine 1% mixed with 1/2 cc of Decadron 10 mg (5 mg) injected using the anterolateral approach without difficulty. No complications with procedure and tolerated well. Patient had decreased pain post-injection.   Signed, Elpidio Galea. Tayvion Lauder, MD 01/10/2013 7:16 PM

## 2013-08-31 ENCOUNTER — Other Ambulatory Visit: Payer: Self-pay | Admitting: Family Medicine

## 2013-08-31 NOTE — Telephone Encounter (Signed)
Electronic refill request, please advise  

## 2013-08-31 NOTE — Telephone Encounter (Signed)
Left voicemail requesting pt to call office 

## 2013-08-31 NOTE — Telephone Encounter (Signed)
Please schedule PE and refill until then  

## 2013-09-05 NOTE — Telephone Encounter (Signed)
Pt left v/m returning call and requesting cb. 

## 2013-09-06 NOTE — Telephone Encounter (Signed)
Left voicemail requesting pt to call our office

## 2013-09-07 NOTE — Telephone Encounter (Signed)
CPE appt scheduled and med refilled

## 2014-01-08 ENCOUNTER — Telehealth: Payer: Self-pay | Admitting: Family Medicine

## 2014-01-08 DIAGNOSIS — Z0000 Encounter for general adult medical examination without abnormal findings: Secondary | ICD-10-CM

## 2014-01-08 NOTE — Telephone Encounter (Signed)
Message copied by Abner Greenspan on Sun Jan 08, 2014 12:09 PM ------      Message from: Selinda Orion J      Created: Thu Jan 05, 2014  2:29 PM      Regarding: Lab orders for Monday, 7.20.15       Patient is scheduled for CPX labs, please order future labs, Thanks , Terri       ------

## 2014-01-09 ENCOUNTER — Ambulatory Visit (INDEPENDENT_AMBULATORY_CARE_PROVIDER_SITE_OTHER): Payer: BC Managed Care – PPO | Admitting: Family Medicine

## 2014-01-09 ENCOUNTER — Encounter: Payer: Self-pay | Admitting: Family Medicine

## 2014-01-09 ENCOUNTER — Other Ambulatory Visit (INDEPENDENT_AMBULATORY_CARE_PROVIDER_SITE_OTHER): Payer: BC Managed Care – PPO

## 2014-01-09 VITALS — BP 118/74 | HR 74 | Temp 98.0°F | Ht 67.0 in | Wt 203.5 lb

## 2014-01-09 DIAGNOSIS — M170 Bilateral primary osteoarthritis of knee: Secondary | ICD-10-CM

## 2014-01-09 DIAGNOSIS — M1710 Unilateral primary osteoarthritis, unspecified knee: Secondary | ICD-10-CM

## 2014-01-09 DIAGNOSIS — Z0000 Encounter for general adult medical examination without abnormal findings: Secondary | ICD-10-CM

## 2014-01-09 LAB — CBC WITH DIFFERENTIAL/PLATELET
BASOS ABS: 0 10*3/uL (ref 0.0–0.1)
Basophils Relative: 0.7 % (ref 0.0–3.0)
EOS PCT: 4.6 % (ref 0.0–5.0)
Eosinophils Absolute: 0.2 10*3/uL (ref 0.0–0.7)
HCT: 41.9 % (ref 36.0–46.0)
Hemoglobin: 14.2 g/dL (ref 12.0–15.0)
LYMPHS PCT: 39.8 % (ref 12.0–46.0)
Lymphs Abs: 2 10*3/uL (ref 0.7–4.0)
MCHC: 33.9 g/dL (ref 30.0–36.0)
MCV: 87.3 fl (ref 78.0–100.0)
MONOS PCT: 9.1 % (ref 3.0–12.0)
Monocytes Absolute: 0.5 10*3/uL (ref 0.1–1.0)
Neutro Abs: 2.3 10*3/uL (ref 1.4–7.7)
Neutrophils Relative %: 45.8 % (ref 43.0–77.0)
PLATELETS: 187 10*3/uL (ref 150.0–400.0)
RBC: 4.8 Mil/uL (ref 3.87–5.11)
RDW: 14.2 % (ref 11.5–15.5)
WBC: 5.1 10*3/uL (ref 4.0–10.5)

## 2014-01-09 LAB — COMPREHENSIVE METABOLIC PANEL
ALT: 18 U/L (ref 0–35)
AST: 23 U/L (ref 0–37)
Albumin: 4 g/dL (ref 3.5–5.2)
Alkaline Phosphatase: 59 U/L (ref 39–117)
BILIRUBIN TOTAL: 1.2 mg/dL (ref 0.2–1.2)
BUN: 21 mg/dL (ref 6–23)
CALCIUM: 9.6 mg/dL (ref 8.4–10.5)
CO2: 29 meq/L (ref 19–32)
Chloride: 105 mEq/L (ref 96–112)
Creatinine, Ser: 0.9 mg/dL (ref 0.4–1.2)
GFR: 66.76 mL/min (ref >60.00)
Glucose, Bld: 98 mg/dL (ref 70–99)
Potassium: 4.5 mEq/L (ref 3.5–5.1)
SODIUM: 139 meq/L (ref 135–145)
Total Protein: 7.1 g/dL (ref 6.0–8.3)

## 2014-01-09 LAB — LIPID PANEL
CHOL/HDL RATIO: 4
Cholesterol: 233 mg/dL — ABNORMAL HIGH (ref 0–200)
HDL: 65.5 mg/dL (ref >39.00)
LDL Cholesterol: 154 mg/dL — ABNORMAL HIGH (ref 0–99)
NONHDL: 167.5
Triglycerides: 66 mg/dL (ref 0.0–149.0)
VLDL: 13.2 mg/dL (ref 0.0–40.0)

## 2014-01-09 LAB — TSH: TSH: 1.66 u[IU]/mL (ref 0.35–4.50)

## 2014-01-09 NOTE — Progress Notes (Signed)
Pre visit review using our clinic review tool, if applicable. No additional management support is needed unless otherwise documented below in the visit note. 

## 2014-01-09 NOTE — Progress Notes (Signed)
[place] [address] Phone: [phone] Fax: [phone]  Patient ID: Michele Cruz MRN: [id number], DOB: April 08, 1956, 58 y.o. Date of Encounter: 01/09/2014  Primary Physician:  Loura Pardon, MD   Chief Complaint: Knee Pain   Subjective:   History of Present Illness:  Michele Cruz is a 58 y.o. very pleasant female patient who presents with the following:  B knee OA. Patient has a known history of advanced osteoarthritis in her bilateral knees with limitations in extension and flexion. She had a great response to Decadron injections one year ago, and she had relief of symptoms for approximately 6 months.  Decadron inj. 4 and 1/2 cc of lidocaine.   Past Medical History, Surgical History, Social History, Family History, Problem List, Medications, and Allergies have been reviewed and updated if relevant.  Review of Systems:  GEN: No fevers, chills. Nontoxic. Primarily MSK c/o today. MSK: Detailed in the HPI GI: tolerating PO intake without difficulty Neuro: No numbness, parasthesias, or tingling associated. Otherwise the pertinent positives of the ROS are noted above.   Objective:   Physical Examination: BP 118/74  Pulse 74  Temp(Src) 98 F (36.7 C) (Oral)  Ht 5\' 7"  (1.702 m)  Wt 203 lb 8 oz (92.307 kg)  BMI 31.87 kg/m2   GEN: WDWN, NAD, Non-toxic, Alert & Oriented x 3 HEENT: Atraumatic, Normocephalic.  Ears and Nose: No external deformity. EXTR: No clubbing/cyanosis/edema NEURO: Normal gait.  PSYCH: Normally interactive. Conversant. Not depressed or anxious appearing.  Calm demeanor.    Bilateral knees: Lacks 7 of extension unable to flex needs to 90-95. Stable to varus and valgus stress. There is some crepitus with motion  Radiology: No results found.  Assessment & Plan:   Primary osteoarthritis of both knees  Procedure only  Knee Injection Patient verbally consented to procedure. Risks (including potential rare risk of infection),  benefits, and alternatives explained. Sterilely prepped with Chloraprep. Ethyl cholride used for anesthesia. 4 cc Lidocaine 1% mixed with Decadron 5 mg injected using the anteromedial approach without difficulty. No complications with procedure and tolerated well. Patient had decreased pain post-injection.   Knee Injection Patient verbally consented to procedure. Risks (including potential rare risk of infection), benefits, and alternatives explained. Sterilely prepped with Chloraprep. Ethyl cholride used for anesthesia. 4 cc Lidocaine 1% mixed with Decadron 5 mg injected using the anteromedial approach without difficulty. No complications with procedure and tolerated well. Patient had decreased pain post-injection.   New Prescriptions   No medications on file   Modified Medications   No medications on file   No orders of the defined types were placed in this encounter.   Follow-up: No Follow-up on file. Unless noted above, the patient is to follow-up if symptoms worsen. Red flags were reviewed with the patient.  Signed,  Maud Deed. Noriah Osgood, MD, CAQ Sports Medicine   Discontinued Medications   ASCORBIC ACID (VITAMIN C) 100 MG TABLET    Take 100 mg by mouth daily.     Current Medications at Discharge:   Medication List       This list is accurate as of: 01/09/14  1:52 PM.  Always use your most recent med list.               busPIRone 15 MG tablet  Commonly known as:  BUSPAR  TAKE 1/2 TABLET BY MOUTH AT BEDTIME AS DIRECTED     CALCIUM/VITAMIN D3 GUMMIES 200-200 MG-UNIT Chew  Generic drug:  Ca Phosphate-Cholecalciferol  Chew 1 tablet  by mouth 2 (two) times daily.     FINACEA 15 % cream  Generic drug:  Azelaic Acid  Topical cream for rosacea on face, use as directed     Fish Oil 1200 MG Caps  Take 1 capsule by mouth daily.     ibuprofen 200 MG tablet  Commonly known as:  ADVIL,MOTRIN  Take 2 by mouth every morning     loratadine 10 MG tablet  Commonly known as:   CLARITIN  Take 10 mg by mouth daily.     magnesium oxide 400 MG tablet  Commonly known as:  MAG-OX  Take 400 mg by mouth daily.     MULTIVITAMIN GUMMIES ADULT PO  Take 2 each by mouth daily.

## 2014-01-10 ENCOUNTER — Other Ambulatory Visit (HOSPITAL_COMMUNITY)
Admission: RE | Admit: 2014-01-10 | Discharge: 2014-01-10 | Disposition: A | Discharge status: Home / Self Care | Payer: BC Managed Care – PPO | Source: Ambulatory Visit | Attending: Family Medicine | Admitting: Family Medicine

## 2014-01-10 ENCOUNTER — Ambulatory Visit (INDEPENDENT_AMBULATORY_CARE_PROVIDER_SITE_OTHER): Payer: BC Managed Care – PPO | Admitting: Family Medicine

## 2014-01-10 ENCOUNTER — Encounter: Payer: Self-pay | Admitting: Family Medicine

## 2014-01-10 VITALS — BP 126/72 | HR 95 | Temp 98.0°F | Ht 67.0 in | Wt 205.0 lb

## 2014-01-10 DIAGNOSIS — Z0000 Encounter for general adult medical examination without abnormal findings: Secondary | ICD-10-CM

## 2014-01-10 DIAGNOSIS — Z01419 Encounter for gynecological examination (general) (routine) without abnormal findings: Secondary | ICD-10-CM | POA: Insufficient documentation

## 2014-01-10 DIAGNOSIS — Z1151 Encounter for screening for human papillomavirus (HPV): Secondary | ICD-10-CM | POA: Insufficient documentation

## 2014-01-10 DIAGNOSIS — E7800 Pure hypercholesterolemia, unspecified: Secondary | ICD-10-CM

## 2014-01-10 NOTE — Progress Notes (Signed)
Subjective:    Patient ID: Michele Cruz, female    DOB: January 20, 1956, 58 y.o.   MRN: [id number]  HPI Here for health maintenance exam and to review chronic medical problems    Is taking an online class  Sat lateral entry requirements   Wt is up 2 lb with bmi of 32   Pap 2/11 Has not had one since then  Wants to get that done  No gyn symptoms or problems   Mammogram 7/13 - is overdue  Will make her own appt  Self exam -no lumps   Flu shot fall 2014   colonosc 8/13- due in 2020 She takes probiotics and thinks this helps keep her healthy   Td 3/12   Results for orders placed in visit on 01/09/14  CBC WITH DIFFERENTIAL      Result Value Ref Range   WBC 5.1  4.0 - 10.5 K/uL   RBC 4.80  3.87 - 5.11 Mil/uL   Hemoglobin 14.2  12.0 - 15.0 g/dL   HCT 41.9  36.0 - 46.0 %   MCV 87.3  78.0 - 100.0 fl   MCHC 33.9  30.0 - 36.0 g/dL   RDW 14.2  11.5 - 15.5 %   Platelets 187.0  150.0 - 400.0 K/uL   Neutrophils Relative % 45.8  43.0 - 77.0 %   Lymphocytes Relative 39.8  12.0 - 46.0 %   Monocytes Relative 9.1  3.0 - 12.0 %   Eosinophils Relative 4.6  0.0 - 5.0 %   Basophils Relative 0.7  0.0 - 3.0 %   Neutro Abs 2.3  1.4 - 7.7 K/uL   Lymphs Abs 2.0  0.7 - 4.0 K/uL   Monocytes Absolute 0.5  0.1 - 1.0 K/uL   Eosinophils Absolute 0.2  0.0 - 0.7 K/uL   Basophils Absolute 0.0  0.0 - 0.1 K/uL  COMPREHENSIVE METABOLIC PANEL      Result Value Ref Range   Sodium 139  135 - 145 mEq/L   Potassium 4.5  3.5 - 5.1 mEq/L   Chloride 105  96 - 112 mEq/L   CO2 29  19 - 32 mEq/L   Glucose, Bld 98  70 - 99 mg/dL   BUN 21  6 - 23 mg/dL   Creatinine, Ser 0.9  0.4 - 1.2 mg/dL   Total Bilirubin 1.2  0.2 - 1.2 mg/dL   Alkaline Phosphatase 59  39 - 117 U/L   AST 23  0 - 37 U/L   ALT 18  0 - 35 U/L   Total Protein 7.1  6.0 - 8.3 g/dL   Albumin 4.0  3.5 - 5.2 g/dL   Calcium 9.6  8.4 - 10.5 mg/dL   GFR 66.76  >60.00 mL/min  TSH      Result Value Ref Range   TSH 1.66  0.35 - 4.50 uIU/mL  LIPID  PANEL      Result Value Ref Range   Cholesterol 233 (*) 0 - 200 mg/dL   Triglycerides 66.0  0.0 - 149.0 mg/dL   HDL 65.50  >39.00 mg/dL   VLDL 13.2  0.0 - 40.0 mg/dL   LDL Cholesterol 154 (*) 0 - 99 mg/dL   Total CHOL/HDL Ratio 4     NonHDL 167.50      A little high LDL at 154  Eats pretty well - she stays away from sat fats most of the time (a little off in the summer)  Patient Active Problem List  Diagnosis Date Noted  . Encounter for routine gynecological examination 01/10/2014  . Osteoarthritis of knee 06/21/2012  . Rash 08/22/2011  . Tick bites 04/07/2011  . Other screening mammogram 04/07/2011  . Routine general medical examination at a health care facility 03/25/2011  . HYPERCHOLESTEROLEMIA 12/11/2009  . COLONIC POLYPS, HX OF 08/03/2009  . 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 HERNIATED CERVICAL DISC 06/20/2009  . BACK PAIN, LUMBAR, WITH RADICULOPATHY 10/13/2008  . ANXIETY 05/24/2007  . ALLERGIC RHINITIS 05/24/2007  . ROSACEA 05/24/2007  . CARDIAC MURMUR 05/24/2007   Past Medical History  Diagnosis Date  . Allergic rhinitis   . Anemia, iron deficiency   . Anxiety   . Heavy menses   . Rosacea   . Zoster   . Arthritis   . Colon polyps   . Spondylosis     deg lumbar disc dz  . History of exercise stress test 06/1999    neg  . History of MRI 2003    neg per patient  . History of MRI of spine 03/2004    C-5, C5-6 disk protrusion  . History of MRI 2007    right quad fatty defect  . Asthma     no symptoms last 25 years  . Hyperlipidemia     LDL  . Hemorrhoids    Past Surgical History  Procedure Laterality Date  . Cesarean section    . Knee surgery      left  . Breast cyst aspiration  03/2002  . Breast cyst aspiration  03/2004    left  . Colonoscopy     History  Substance Use Topics  . Smoking status: Never Smoker   . Smokeless tobacco: Never Used  . Alcohol Use: 1.2 oz/week    2 Glasses of wine per week     Comment: 2-3 glasses wine or beer  weekly per pt.   Family History  Problem Relation Age of Onset  . Arthritis Mother     OA  . Obesity Mother   . Heart disease Father     CAD  . Alcohol abuse Father   . Diabetes Father   . Hyperlipidemia Sister   . Diabetes Other   . Colon cancer Neg Hx    Allergies  Allergen Reactions  . Lipitor [Atorvastatin Calcium] Other (See Comments)    Leg cramps and pain  . Depo-Medrol [Methylprednisolone Acetate]     Rash after injection, a noted common SE from depo-medrol listed in epocrates and most resources  . Meloxicam     Pt felt dizzy potentially from this, but does well on aleve  . Simvastatin     Possible transient memory changes, resolved off medicine   Current Outpatient Prescriptions on File Prior to Visit  Medication Sig Dispense Refill  . Azelaic Acid (FINACEA) 15 % cream Topical cream for rosacea on face, use as directed       . busPIRone (BUSPAR) 15 MG tablet TAKE 1/2 TABLET BY MOUTH AT BEDTIME AS DIRECTED  45 tablet  1  . Ca Phosphate-Cholecalciferol (CALCIUM/VITAMIN D3 GUMMIES) 200-200 MG-UNIT CHEW Chew 1 tablet by mouth 2 (two) times daily.      Marland Kitchen ibuprofen (ADVIL,MOTRIN) 200 MG tablet Take 2 by mouth every morning      . loratadine (CLARITIN) 10 MG tablet Take 10 mg by mouth daily.      . magnesium oxide (MAG-OX) 400 MG tablet Take 400 mg by mouth daily.        . Multiple Vitamins-Minerals (MULTIVITAMIN  GUMMIES ADULT PO) Take 2 each by mouth daily.      . Omega-3 Fatty Acids (FISH OIL) 1200 MG CAPS Take 1 capsule by mouth daily.        No current facility-administered medications on file prior to visit.    Review of Systems    Review of Systems  Constitutional: Negative for fever, appetite change, fatigue and unexpected weight change.  Eyes: Negative for pain and visual disturbance.  Respiratory: Negative for cough and shortness of breath.   Cardiovascular: Negative for cp or palpitations    Gastrointestinal: Negative for nausea, diarrhea and constipation.    Genitourinary: Negative for urgency and frequency.  Skin: Negative for pallor or rash   Neurological: Negative for weakness, light-headedness, numbness and headaches.  Hematological: Negative for adenopathy. Does not bruise/bleed easily.  Psychiatric/Behavioral: Negative for dysphoric mood. The patient is not nervous/anxious.       Objective:   Physical Exam  Constitutional: She appears well-developed and well-nourished. No distress.  obese and well appearing   HENT:  Head: Normocephalic and atraumatic.  Right Ear: External ear normal.  Left Ear: External ear normal.  Mouth/Throat: Oropharynx is clear and moist.  Eyes: Conjunctivae and EOM are normal. Pupils are equal, round, and reactive to light. No scleral icterus.  Neck: Normal range of motion. Neck supple. No JVD present. Carotid bruit is not present. No thyromegaly present.  Cardiovascular: Normal rate, regular rhythm and intact distal pulses.  Exam reveals no gallop.   Murmur heard. Pulmonary/Chest: Effort normal and breath sounds normal. No respiratory distress. She has no wheezes. She exhibits no tenderness.  Abdominal: Soft. Bowel sounds are normal. She exhibits no distension, no abdominal bruit and no mass. There is no tenderness.  Genitourinary: Vagina normal and uterus normal. No breast swelling, tenderness, discharge or bleeding. There is no rash, tenderness or lesion on the right labia. There is no rash, tenderness or lesion on the left labia. Uterus is not enlarged and not tender. Cervix exhibits no motion tenderness, no discharge and no friability. Right adnexum displays no mass, no tenderness and no fullness. Left adnexum displays no mass, no tenderness and no fullness. No erythema or bleeding around the vagina.  Breast exam: No mass, nodules, thickening, tenderness, bulging, retraction, inflamation, nipple discharge or skin changes noted.  No axillary or clavicular LA.      Musculoskeletal: Normal range of motion. She  exhibits no edema and no tenderness.  Lymphadenopathy:    She has no cervical adenopathy.  Neurological: She is alert. She has normal reflexes. No cranial nerve deficit. She exhibits normal muscle tone. Coordination normal.  Skin: Skin is warm and dry. No rash noted. No erythema. No pallor.  Rosacea on cheeks  Psychiatric: She has a normal mood and affect.          Assessment & Plan:

## 2014-01-10 NOTE — Progress Notes (Signed)
Pre visit review using our clinic review tool, if applicable. No additional management support is needed unless otherwise documented below in the visit note. 

## 2014-01-10 NOTE — Patient Instructions (Signed)
Take care of yourself  Try to lower cholesterol with better diet  Avoid red meat/ fried foods/ egg yolks/ fatty breakfast meats/ butter, cheese and high fat dairy/ and shellfish      Fat and Cholesterol Control Diet Fat and cholesterol levels in your blood and organs are influenced by your diet. High levels of fat and cholesterol may lead to diseases of the heart, small and large blood vessels, gallbladder, liver, and pancreas. CONTROLLING FAT AND CHOLESTEROL WITH DIET Although exercise and lifestyle factors are important, your diet is key. That is because certain foods are known to raise cholesterol and others to lower it. The goal is to balance foods for their effect on cholesterol and more importantly, to replace saturated and trans fat with other types of fat, such as monounsaturated fat, polyunsaturated fat, and omega-3 fatty acids. On average, a person should consume no more than 15 to 17 g of saturated fat daily. Saturated and trans fats are considered "bad" fats, and they will raise LDL cholesterol. Saturated fats are primarily found in animal products such as meats, butter, and cream. However, that does not mean you need to give up all your favorite foods. Today, there are good tasting, low-fat, low-cholesterol substitutes for most of the things you like to eat. Choose low-fat or nonfat alternatives. Choose round or loin cuts of red meat. These types of cuts are lowest in fat and cholesterol. Chicken (without the skin), fish, veal, and ground [place] breast are great choices. Eliminate fatty meats, such as hot dogs and salami. Even shellfish have little or no saturated fat. Have a 3 oz (85 g) portion when you eat lean meat, poultry, or fish. Trans fats are also called "partially hydrogenated oils." They are oils that have been scientifically manipulated so that they are solid at room temperature resulting in a longer shelf life and improved taste and texture of foods in which they are added. Trans  fats are found in stick margarine, some tub margarines, cookies, crackers, and baked goods.  When baking and cooking, oils are a great substitute for butter. The monounsaturated oils are especially beneficial since it is believed they lower LDL and raise HDL. The oils you should avoid entirely are saturated tropical oils, such as coconut and palm.  Remember to eat a lot from food groups that are naturally free of saturated and trans fat, including fish, fruit, vegetables, beans, grains (barley, rice, couscous, bulgur wheat), and pasta (without cream sauces).  IDENTIFYING FOODS THAT LOWER FAT AND CHOLESTEROL  Soluble fiber may lower your cholesterol. This type of fiber is found in fruits such as apples, vegetables such as broccoli, potatoes, and carrots, legumes such as beans, peas, and lentils, and grains such as barley. Foods fortified with plant sterols (phytosterol) may also lower cholesterol. You should eat at least 2 g per day of these foods for a cholesterol lowering effect.  Read package labels to identify low-saturated fats, trans fat free, and low-fat foods at the supermarket. Select cheeses that have only 2 to 3 g saturated fat per ounce. Use a heart-healthy tub margarine that is free of trans fats or partially hydrogenated oil. When buying baked goods (cookies, crackers), avoid partially hydrogenated oils. Breads and muffins should be made from whole grains (whole-wheat or whole oat flour, instead of "flour" or "enriched flour"). Buy non-creamy canned soups with reduced salt and no added fats.  FOOD PREPARATION TECHNIQUES  Never deep-fry. If you must fry, either stir-fry, which uses very little fat, or  use non-stick cooking sprays. When possible, broil, bake, or roast meats, and steam vegetables. Instead of putting butter or margarine on vegetables, use lemon and herbs, applesauce, and cinnamon (for squash and sweet potatoes). Use nonfat yogurt, salsa, and low-fat dressings for salads.   LOW-SATURATED FAT / LOW-FAT FOOD SUBSTITUTES Meats / Saturated Fat (g)  Avoid: Steak, marbled (3 oz/85 g) / 11 g  Choose: Steak, lean (3 oz/85 g) / 4 g  Avoid: Hamburger (3 oz/85 g) / 7 g  Choose: Hamburger, lean (3 oz/85 g) / 5 g  Avoid: Ham (3 oz/85 g) / 6 g  Choose: Ham, lean cut (3 oz/85 g) / 2.4 g  Avoid: Chicken, with skin, dark meat (3 oz/85 g) / 4 g  Choose: Chicken, skin removed, dark meat (3 oz/85 g) / 2 g  Avoid: Chicken, with skin, light meat (3 oz/85 g) / 2.5 g  Choose: Chicken, skin removed, light meat (3 oz/85 g) / 1 g Dairy / Saturated Fat (g)  Avoid: Whole milk (1 cup) / 5 g  Choose: Low-fat milk, 2% (1 cup) / 3 g  Choose: Low-fat milk, 1% (1 cup) / 1.5 g  Choose: Skim milk (1 cup) / 0.3 g  Avoid: Hard cheese (1 oz/28 g) / 6 g  Choose: Skim milk cheese (1 oz/28 g) / 2 to 3 g  Avoid: Cottage cheese, 4% fat (1 cup) / 6.5 g  Choose: Low-fat cottage cheese, 1% fat (1 cup) / 1.5 g  Avoid: Ice cream (1 cup) / 9 g  Choose: Sherbet (1 cup) / 2.5 g  Choose: Nonfat frozen yogurt (1 cup) / 0.3 g  Choose: Frozen fruit bar / trace  Avoid: Whipped cream (1 tbs) / 3.5 g  Choose: Nondairy whipped topping (1 tbs) / 1 g Condiments / Saturated Fat (g)  Avoid: Mayonnaise (1 tbs) / 2 g  Choose: Low-fat mayonnaise (1 tbs) / 1 g  Avoid: Butter (1 tbs) / 7 g  Choose: Extra light margarine (1 tbs) / 1 g  Avoid: Coconut oil (1 tbs) / 11.8 g  Choose: Olive oil (1 tbs) / 1.8 g  Choose: Corn oil (1 tbs) / 1.7 g  Choose: Safflower oil (1 tbs) / 1.2 g  Choose: Sunflower oil (1 tbs) / 1.4 g  Choose: Soybean oil (1 tbs) / 2.4 g  Choose: Canola oil (1 tbs) / 1 g Document Released: 06/09/2005 Document Revised: 10/04/2012 Document Reviewed: 11/28/2010 Exit[employer] Patient Information 2015 [place], [place]. This information is not intended to replace advice given to you by your health care provider. Make sure you discuss any questions you have with your health care  provider.

## 2014-01-11 LAB — CYTOLOGY - PAP

## 2014-01-11 NOTE — Assessment & Plan Note (Signed)
Reviewed health habits including diet and exercise and skin cancer prevention Reviewed appropriate screening tests for age  Also reviewed health mt list, fam hx and immunization status , as well as social and family history   See HPI Labs reviewed  

## 2014-01-11 NOTE — Assessment & Plan Note (Signed)
Routine exam with pap  No c/o or abnormalities

## 2014-01-11 NOTE — Assessment & Plan Note (Signed)
Disc goals for lipids and reasons to control them Rev labs with pt Rev low sat fat diet in detail  Given handout on low cholesterol diet Enc exercise

## 2014-01-13 ENCOUNTER — Encounter: Payer: Self-pay | Admitting: Family Medicine

## 2014-03-03 ENCOUNTER — Other Ambulatory Visit: Payer: Self-pay | Admitting: Family Medicine

## 2014-03-03 NOTE — Telephone Encounter (Signed)
done

## 2014-03-03 NOTE — Telephone Encounter (Signed)
Electronic refill request, please advise  

## 2014-03-03 NOTE — Telephone Encounter (Signed)
Please refill for a year  

## 2014-11-27 ENCOUNTER — Telehealth: Payer: Self-pay | Admitting: *Deleted

## 2014-11-27 NOTE — Telephone Encounter (Signed)
Mammogram f/u call. Left voicemail requesting pt to call office back, she is due for a mammogram (over 2 yrs)

## 2014-11-27 NOTE — Telephone Encounter (Signed)
Pt returned my call. Phone # given to [place]. Pt will call and schedule appt some time this summer

## 2014-11-30 ENCOUNTER — Encounter: Payer: Self-pay | Admitting: Internal Medicine

## 2014-12-07 ENCOUNTER — Other Ambulatory Visit: Payer: Self-pay

## 2014-12-07 DIAGNOSIS — Z1231 Encounter for screening mammogram for malignant neoplasm of breast: Secondary | ICD-10-CM

## 2014-12-11 ENCOUNTER — Ambulatory Visit (INDEPENDENT_AMBULATORY_CARE_PROVIDER_SITE_OTHER): Payer: BC Managed Care – PPO | Admitting: Family Medicine

## 2014-12-11 ENCOUNTER — Encounter: Payer: Self-pay | Admitting: Family Medicine

## 2014-12-11 VITALS — BP 118/78 | HR 68 | Temp 98.5°F | Ht 67.0 in | Wt 206.5 lb

## 2014-12-11 DIAGNOSIS — M170 Bilateral primary osteoarthritis of knee: Secondary | ICD-10-CM

## 2014-12-11 MED ORDER — DEXAMETHASONE SODIUM PHOSPHATE 10 MG/ML IJ SOLN
10.0000 mg | Freq: Once | INTRAMUSCULAR | Status: AC
  Administered 2014-12-11: 10 mg via INTRA_ARTICULAR

## 2014-12-11 NOTE — Progress Notes (Signed)
Dr. Frederico Hamman T. Kessie Croston, MD, [facility] [place], [address] Phone: [phone] Fax: [phone]  12/11/2014  Patient: Michele Cruz, MRN: [id number], DOB: [birth year], 59 y.o.  Primary Physician:  Loura Pardon, MD  Chief Complaint: Knee Pain  Subjective:   Shoshana Johal is a 59 y.o. very pleasant female patient who presents with the following:  Patient is known well with relatively advanced osteoarthritis of both knees. She has been having some pain worsening over the last few months. She is not really having any swelling or any mechanical buckling. She does take some Advil and some Tumeric as needed for inflammation. I have not seen her since July of last year, when I did a bilateral Decadron injection. Good prior responses.  Past Medical History, Surgical History, Social History, Family History, Problem List, Medications, and Allergies have been reviewed and updated if relevant.  Patient Active Problem List   Diagnosis Date Noted  . Encounter for routine gynecological examination 01/10/2014  . Osteoarthritis of knee 06/21/2012  . Other screening mammogram 04/07/2011  . Routine general medical examination at a health care facility 03/25/2011  . HYPERCHOLESTEROLEMIA 12/11/2009  . COLONIC POLYPS, HX OF 08/03/2009  . 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 HERNIATED CERVICAL DISC 06/20/2009  . BACK PAIN, LUMBAR, WITH RADICULOPATHY 10/13/2008  . ANXIETY 05/24/2007  . ALLERGIC RHINITIS 05/24/2007  . ROSACEA 05/24/2007  . CARDIAC MURMUR 05/24/2007    Past Medical History  Diagnosis Date  . Allergic rhinitis   . Anemia, iron deficiency   . Anxiety   . Heavy menses   . Rosacea   . Zoster   . Arthritis   . Colon polyps   . Spondylosis     deg lumbar disc dz  . History of exercise stress test 06/1999    neg  . History of MRI 2003    neg per patient  . History of MRI of spine 03/2004    C-5, C5-6 disk protrusion    . History of MRI 2007    right quad fatty defect  . Asthma     no symptoms last 25 years  . Hyperlipidemia     LDL  . Hemorrhoids     Past Surgical History  Procedure Laterality Date  . Cesarean section    . Knee surgery      left  . Breast cyst aspiration  03/2002  . Breast cyst aspiration  03/2004    left  . Colonoscopy      History   Social History  . Marital Status: Married    Spouse Name: N/A  . Number of Children: 1  . Years of Education: N/A   Occupational History  . 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 Smoking status: Never Smoker   . Smokeless tobacco: Never Used  . Alcohol Use: 1.2 oz/week    2 Glasses of wine per week     Comment: 2-3 glasses wine or beer weekly per pt.  . Drug Use: No  . Sexual Activity: Not on file   Other Topics Concern  . Not on file   Social History Narrative   One daughter   Daily caffeine; use 1-2 daily    Family History  Problem Relation Age of Onset  . Arthritis Mother     OA  . Obesity Mother   . Heart disease Father     CAD  . Alcohol  abuse Father   . Diabetes Father   . Hyperlipidemia Sister   . Diabetes Other   . Colon cancer Neg Hx     Allergies  Allergen Reactions  . Lipitor [Atorvastatin Calcium] Other (See Comments)    Leg cramps and pain  . Depo-Medrol [Methylprednisolone Acetate]     Rash after injection, a noted common SE from depo-medrol listed in epocrates and most resources  . Meloxicam     Pt felt dizzy potentially from this, but does well on aleve  . Simvastatin     Possible transient memory changes, resolved off medicine    Medication list reviewed and updated in full in [place].  GEN: No fevers, chills. Nontoxic. Primarily MSK c/o today. MSK: Detailed in the HPI GI: tolerating PO intake without difficulty Neuro: No numbness, parasthesias, or tingling associated. Otherwise the pertinent positives of the ROS are noted above.   Objective:    BP 118/78 mmHg  Pulse 68  Temp(Src) 98.5 F (36.9 C) (Oral)  Ht 5\' 7"  (1.702 m)  Wt 206 lb 8 oz (93.668 kg)  BMI 32.33 kg/m2   GEN: WDWN, NAD, Non-toxic, Alert & Oriented x 3 HEENT: Atraumatic, Normocephalic.  Ears and Nose: No external deformity. EXTR: No clubbing/cyanosis/edema NEURO: Normal gait.  PSYCH: Normally interactive. Conversant. Not depressed or anxious appearing.  Calm demeanor.   Knee:  B Gait: Normal heel toe pattern ROM: loss 5 deg ext to 100 flexion Effusion: neg Echymosis or edema: none Patellar tendon NT Painful PLICA: neg Patellar grind: negative Medial and lateral patellar facet loading: negative medial and lateral joint lines: medial > lateral Mcmurray's pain Flexion-pinch pos Varus and valgus stress: stable Lachman: neg Ant and Post drawer: neg Hip abduction, IR, ER: WNL Hip flexion str: 5/5 Hip abd: 5/5 Quad: 5/5 VMO atrophy: mild Hamstring concentric and eccentric: 5/5   Radiology: No results found.  Assessment and Plan:   Primary osteoarthritis of both knees  Known advanced OA. Doing well clinically. Some B pain now, going on vacation, wants to resume activity and working out this summer.   Knee Injection, R Patient verbally consented to procedure. Risks (including potential rare risk of infection), benefits, and alternatives explained. Sterilely prepped with Chloraprep. Ethyl cholride used for anesthesia. 4 cc Lidocaine 1% mixed with Decadron 5 mg injected using the anteromedial approach without difficulty. No complications with procedure and tolerated well. Patient had decreased pain post-injection.   Knee Injection, R Patient verbally consented to procedure. Risks (including potential rare risk of infection), benefits, and alternatives explained. Sterilely prepped with Chloraprep. Ethyl cholride used for anesthesia. 4 cc Lidocaine 1% mixed with Decadron 5 mg injected using the anteromedial approach without difficulty. No  complications with procedure and tolerated well. Patient had decreased pain post-injection.   Follow-up: prn  Signed,  Gladies Sofranko T. Judit Awad, MD   Patient's Medications  New Prescriptions   No medications on file  Previous Medications   AZELAIC ACID (FINACEA) 15 % CREAM    Topical cream for rosacea on face, use as directed    BUSPIRONE (BUSPAR) 15 MG TABLET    TAKE 1/2 TABLET BY MOUTH AT BEDTIME AS DIRECTED   CA PHOSPHATE-CHOLECALCIFEROL (CALCIUM/VITAMIN D3 GUMMIES) 200-200 MG-UNIT CHEW    Chew 1 tablet by mouth 2 (two) times daily.   IBUPROFEN (ADVIL,MOTRIN) 200 MG TABLET    Take 2 by mouth every morning   LORATADINE (CLARITIN) 10 MG TABLET    Take 10 mg by mouth daily.   MAGNESIUM OXIDE (  MAG-OX) 400 MG TABLET    Take 400 mg by mouth daily.     MULTIPLE VITAMINS-MINERALS (MULTIVITAMIN GUMMIES ADULT PO)    Take 2 each by mouth daily.   OMEGA-3 FATTY ACIDS (FISH OIL) 1200 MG CAPS    Take 1 capsule by mouth daily.    TURMERIC PO    Take by mouth 2 (two) times daily.  Modified Medications   No medications on file  Discontinued Medications   No medications on file

## 2014-12-11 NOTE — Addendum Note (Signed)
Addended by: Emelia Salisbury C on: 12/11/2014 02:35 PM   Modules accepted: Orders

## 2014-12-11 NOTE — Progress Notes (Signed)
Pre visit review using our clinic review tool, if applicable. No additional management support is needed unless otherwise documented below in the visit note. 

## 2015-01-05 ENCOUNTER — Ambulatory Visit
Admission: RE | Admit: 2015-01-05 | Discharge: 2015-01-05 | Disposition: A | Discharge status: Home / Self Care | Payer: BC Managed Care – PPO | Source: Ambulatory Visit

## 2015-01-05 DIAGNOSIS — Z1231 Encounter for screening mammogram for malignant neoplasm of breast: Secondary | ICD-10-CM

## 2015-04-12 ENCOUNTER — Telehealth: Payer: Self-pay | Admitting: Family Medicine

## 2015-04-12 DIAGNOSIS — Z0000 Encounter for general adult medical examination without abnormal findings: Secondary | ICD-10-CM

## 2015-04-12 NOTE — Telephone Encounter (Signed)
-----   Message from Ellamae Sia sent at 04/06/2015 12:47 PM EDT ----- Regarding: Lab orders for Friday, 10.21.16 Patient is scheduled for CPX labs, please order future labs, Thanks , Karna Christmas

## 2015-04-13 ENCOUNTER — Other Ambulatory Visit (INDEPENDENT_AMBULATORY_CARE_PROVIDER_SITE_OTHER): Payer: BC Managed Care – PPO

## 2015-04-13 DIAGNOSIS — Z0000 Encounter for general adult medical examination without abnormal findings: Secondary | ICD-10-CM | POA: Diagnosis not present

## 2015-04-13 LAB — CBC WITH DIFFERENTIAL/PLATELET
Basophils Absolute: 0.1 10*3/uL (ref 0.0–0.1)
Basophils Relative: 1 % (ref 0–1)
EOS PCT: 3 % (ref 0–5)
Eosinophils Absolute: 0.2 10*3/uL (ref 0.0–0.7)
HEMATOCRIT: 42.1 % (ref 36.0–46.0)
HEMOGLOBIN: 14 g/dL (ref 12.0–15.0)
LYMPHS PCT: 34 % (ref 12–46)
Lymphs Abs: 2.2 10*3/uL (ref 0.7–4.0)
MCH: 28.7 pg (ref 26.0–34.0)
MCHC: 33.3 g/dL (ref 30.0–36.0)
MCV: 86.3 fL (ref 78.0–100.0)
MONO ABS: 0.4 10*3/uL (ref 0.1–1.0)
MONOS PCT: 7 % (ref 3–12)
MPV: 9.3 fL (ref 8.6–12.4)
NEUTROS ABS: 3.5 10*3/uL (ref 1.7–7.7)
Neutrophils Relative %: 55 % (ref 43–77)
Platelets: 206 10*3/uL (ref 150–400)
RBC: 4.88 MIL/uL (ref 3.87–5.11)
RDW: 14.2 % (ref 11.5–15.5)
WBC: 6.4 10*3/uL (ref 4.0–10.5)

## 2015-04-13 LAB — COMPREHENSIVE METABOLIC PANEL
ALBUMIN: 4.3 g/dL (ref 3.6–5.1)
ALT: 12 U/L (ref 6–29)
AST: 17 U/L (ref 10–35)
Alkaline Phosphatase: 59 U/L (ref 33–130)
BILIRUBIN TOTAL: 0.7 mg/dL (ref 0.2–1.2)
BUN: 24 mg/dL (ref 7–25)
CHLORIDE: 103 mmol/L (ref 98–110)
CO2: 26 mmol/L (ref 20–31)
CREATININE: 0.83 mg/dL (ref 0.50–1.05)
Calcium: 9.5 mg/dL (ref 8.6–10.4)
Glucose, Bld: 88 mg/dL (ref 65–99)
Potassium: 4.1 mmol/L (ref 3.5–5.3)
SODIUM: 139 mmol/L (ref 135–146)
TOTAL PROTEIN: 7 g/dL (ref 6.1–8.1)

## 2015-04-13 LAB — LIPID PANEL
CHOL/HDL RATIO: 3.3 ratio (ref <=5.0)
Cholesterol: 206 mg/dL — ABNORMAL HIGH (ref 125–200)
HDL: 62 mg/dL (ref >=46)
LDL CALC: 126 mg/dL (ref <130)
Triglycerides: 89 mg/dL (ref <150)
VLDL: 18 mg/dL (ref <30)

## 2015-04-13 LAB — TSH: TSH: 2.45 u[IU]/mL (ref 0.350–4.500)

## 2015-04-18 ENCOUNTER — Ambulatory Visit (INDEPENDENT_AMBULATORY_CARE_PROVIDER_SITE_OTHER): Payer: BC Managed Care – PPO | Admitting: Family Medicine

## 2015-04-18 ENCOUNTER — Encounter: Payer: Self-pay | Admitting: Family Medicine

## 2015-04-18 VITALS — BP 122/74 | HR 86 | Temp 98.4°F | Ht 66.5 in | Wt 199.0 lb

## 2015-04-18 DIAGNOSIS — F411 Generalized anxiety disorder: Secondary | ICD-10-CM

## 2015-04-18 DIAGNOSIS — Z0000 Encounter for general adult medical examination without abnormal findings: Secondary | ICD-10-CM

## 2015-04-18 DIAGNOSIS — E7800 Pure hypercholesterolemia, unspecified: Secondary | ICD-10-CM

## 2015-04-18 NOTE — Progress Notes (Signed)
Pre visit review using our clinic review tool, if applicable. No additional management support is needed unless otherwise documented below in the visit note. 

## 2015-04-18 NOTE — Progress Notes (Signed)
Subjective:    Patient ID: Michele Cruz, female    DOB: [birth year], 59 y.o.   MRN: [id number]  HPI Here for health maintenance exam and to review chronic medical problems    Doing pretty well overall  Had a good summer  Traveled - cruise   She is taking care of herself   Wt is down 7 lb with bmi of 31 Going back to school and eating healthier - better overall  A bit stressful also   Screen for Hep C/HIV-not high risk  Has donated blood as well   Flu vaccine - had it on sunday  Mm 7/16 nl  Self exam-no lumps    Pap nl 7/15 Menopause  No problems or hx of abn paps    colonosc 8/13 with 7 year recall (past polyps)   Td 3/12   Wants to wean off buspar Is taking 1/3 pill three times per week and doing ok  In the past cold [place] felt weird -this time doing ok  Mood is pretty good overall    Cholesterol  Lab Results  Component Value Date   CHOL 206* 04/13/2015   CHOL 233* 01/09/2014   CHOL 188 06/14/2012   Lab Results  Component Value Date   HDL 62 04/13/2015   HDL 65.50 01/09/2014   HDL 55.80 06/14/2012   Lab Results  Component Value Date   LDLCALC 126 04/13/2015   LDLCALC 154* 01/09/2014   LDLCALC 122* 06/14/2012   Lab Results  Component Value Date   TRIG 89 04/13/2015   TRIG 66.0 01/09/2014   TRIG 52.0 06/14/2012   Lab Results  Component Value Date   CHOLHDL 3.3 04/13/2015   CHOLHDL 4 01/09/2014   CHOLHDL 3 06/14/2012   Lab Results  Component Value Date   LDLDIRECT 152.4 03/27/2011   LDLDIRECT 150.6 03/20/2010   LDLDIRECT 150.4 12/11/2009    Eating better - LDL is down from 154 to 126  HDL is good   Patient Active Problem List   Diagnosis Date Noted  . Encounter for routine gynecological examination 01/10/2014  . Osteoarthritis of knee 06/21/2012  . Other screening mammogram 04/07/2011  . Routine general medical examination at a health care facility 03/25/2011  . HYPERCHOLESTEROLEMIA 12/11/2009  . COLONIC POLYPS, HX OF  08/03/2009  . 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 HERNIATED CERVICAL DISC 06/20/2009  . BACK PAIN, LUMBAR, WITH RADICULOPATHY 10/13/2008  . Generalized anxiety disorder 05/24/2007  . ALLERGIC RHINITIS 05/24/2007  . ROSACEA 05/24/2007  . CARDIAC MURMUR 05/24/2007   Past Medical History  Diagnosis Date  . Allergic rhinitis   . Anemia, iron deficiency   . Anxiety   . Heavy menses   . Rosacea   . Zoster   . Arthritis   . Colon polyps   . Spondylosis     deg lumbar disc dz  . History of exercise stress test 06/1999    neg  . History of MRI 2003    neg per patient  . History of MRI of spine 03/2004    C-5, C5-6 disk protrusion  . History of MRI 2007    right quad fatty defect  . Asthma     no symptoms last 25 years  . Hyperlipidemia     LDL  . Hemorrhoids    Past Surgical History  Procedure Laterality Date  . Cesarean section    . Knee surgery      left  . Breast cyst aspiration  03/2002  . Breast  cyst aspiration  03/2004    left  . Colonoscopy     Social History  Substance Use Topics  . Smoking status: Never Smoker   . Smokeless tobacco: Never Used  . Alcohol Use: 1.2 oz/week    2 Glasses of wine per week     Comment: 2-3 glasses wine or beer weekly per pt.   Family History  Problem Relation Age of Onset  . Arthritis Mother     OA  . Obesity Mother   . Heart disease Father     CAD  . Alcohol abuse Father   . Diabetes Father   . Hyperlipidemia Sister   . Diabetes Other   . Colon cancer Neg Hx    Allergies  Allergen Reactions  . Decadron [Dexamethasone] Other (See Comments)    Unsure if related---swelling of throat  . Lipitor [Atorvastatin Calcium] Other (See Comments)    Leg cramps and pain  . Depo-Medrol [Methylprednisolone Acetate]     Rash after injection, a noted common SE from depo-medrol listed in epocrates and most resources  . Meloxicam     Pt felt dizzy potentially from this, but does well on aleve  . Simvastatin     Possible transient  memory changes, resolved off medicine   Current Outpatient Prescriptions on File Prior to Visit  Medication Sig Dispense Refill  . busPIRone (BUSPAR) 15 MG tablet TAKE 1/2 TABLET BY MOUTH AT BEDTIME AS DIRECTED (Patient taking differently: TAKE 1/3 by mouth every other day) 45 tablet 3  . ibuprofen (ADVIL,MOTRIN) 200 MG tablet Take 2 by mouth every morning    . loratadine (CLARITIN) 10 MG tablet Take 10 mg by mouth daily.    . magnesium oxide (MAG-OX) 400 MG tablet Take 400 mg by mouth daily.      . Omega-3 Fatty Acids (FISH OIL) 1200 MG CAPS Take 1 capsule by mouth daily.     . TURMERIC PO Take by mouth 2 (two) times daily.     No current facility-administered medications on file prior to visit.     Review of Systems Review of Systems  Constitutional: Negative for fever, appetite change, fatigue and unexpected weight change.  Eyes: Negative for pain and visual disturbance.  Respiratory: Negative for cough and shortness of breath.   Cardiovascular: Negative for cp or palpitations    Gastrointestinal: Negative for nausea, diarrhea and constipation.  Genitourinary: Negative for urgency and frequency.  Skin: Negative for pallor or rash   Neurological: Negative for weakness, light-headedness, numbness and headaches.  Hematological: Negative for adenopathy. Does not bruise/bleed easily.  Psychiatric/Behavioral: Negative for dysphoric mood. The patient is not nervous/anxious.         Objective:   Physical Exam  Constitutional: She appears well-developed and well-nourished. No distress.  obese and well appearing    HENT:  Head: Normocephalic and atraumatic.  Right Ear: External ear normal.  Left Ear: External ear normal.  Mouth/Throat: Oropharynx is clear and moist.  Eyes: Conjunctivae and EOM are normal. Pupils are equal, round, and reactive to light. No scleral icterus.  Neck: Normal range of motion. Neck supple. No JVD present. Carotid bruit is not present. No thyromegaly present.   Cardiovascular: Normal rate, regular rhythm and intact distal pulses.  Exam reveals no gallop.   Murmur heard. Pulmonary/Chest: Effort normal and breath sounds normal. No respiratory distress. She has no wheezes. She exhibits no tenderness.  Abdominal: Soft. Bowel sounds are normal. She exhibits no distension, no abdominal bruit and no mass.  There is no tenderness.  Genitourinary: No breast swelling, tenderness, discharge or bleeding.  Breast exam: No mass, nodules, thickening, tenderness, bulging, retraction, inflamation, nipple discharge or skin changes noted.  No axillary or clavicular LA.      Musculoskeletal: Normal range of motion. She exhibits no edema or tenderness.  Lymphadenopathy:    She has no cervical adenopathy.  Neurological: She is alert. She has normal reflexes. No cranial nerve deficit. She exhibits normal muscle tone. Coordination normal.  Skin: Skin is warm and dry. No rash noted. No erythema. No pallor.  Mild rosacea  Some solar lentigo  Psychiatric: She has a normal mood and affect.          Assessment & Plan:   Problem List Items Addressed This Visit      Other   Generalized anxiety disorder    Pt is doing better overall and in the process of weaning from buspar  Reviewed stressors/ coping techniques/symptoms/ support sources/ tx options and side effects in detail today Will be able to stop it soon      HYPERCHOLESTEROLEMIA    Improved LDL  Disc goals for lipids and reasons to control them Rev labs with pt Rev low sat fat diet in detail Enc her to continue good habits       Routine general medical examination at a health care facility - Primary    Reviewed health habits including diet and exercise and skin cancer prevention Reviewed appropriate screening tests for age  Also reviewed health mt list, fam hx and immunization status , as well as social and family history   See HPI Labs reviewed  Take care of yourself  You can stop the Buspar at any  time Cholesterol is improved Weight is down  Try some lower impact exercise

## 2015-04-18 NOTE — Patient Instructions (Signed)
Take care of yourself  You can stop the Buspar at any time Cholesterol is improved Weight is down  Try some lower impact exercise

## 2015-04-19 NOTE — Assessment & Plan Note (Signed)
Improved LDL  Disc goals for lipids and reasons to control them Rev labs with pt Rev low sat fat diet in detail Enc her to continue good habits

## 2015-04-19 NOTE — Assessment & Plan Note (Signed)
Reviewed health habits including diet and exercise and skin cancer prevention Reviewed appropriate screening tests for age  Also reviewed health mt list, fam hx and immunization status , as well as social and family history   See HPI Labs reviewed  Take care of yourself  You can stop the Buspar at any time Cholesterol is improved Weight is down  Try some lower impact exercise

## 2015-04-19 NOTE — Assessment & Plan Note (Signed)
Pt is doing better overall and in the process of weaning from buspar  Reviewed stressors/ coping techniques/symptoms/ support sources/ tx options and side effects in detail today Will be able to stop it soon

## 2015-12-11 ENCOUNTER — Ambulatory Visit (INDEPENDENT_AMBULATORY_CARE_PROVIDER_SITE_OTHER): Payer: BC Managed Care – PPO | Admitting: Family Medicine

## 2015-12-11 ENCOUNTER — Encounter: Payer: Self-pay | Admitting: Family Medicine

## 2015-12-11 VITALS — BP 110/80 | HR 71 | Temp 98.8°F | Ht 66.5 in | Wt 199.2 lb

## 2015-12-11 DIAGNOSIS — M170 Bilateral primary osteoarthritis of knee: Secondary | ICD-10-CM

## 2015-12-11 MED ORDER — HYLAN G-F 20 48 MG/6ML IX SOSY
48.0000 [IU] | PREFILLED_SYRINGE | Freq: Once | INTRA_ARTICULAR | Status: AC
  Administered 2015-12-11: 48 [IU] via INTRA_ARTICULAR

## 2015-12-11 NOTE — Progress Notes (Signed)
Pre visit review using our clinic review tool, if applicable. No additional management support is needed unless otherwise documented below in the visit note. 

## 2015-12-11 NOTE — Patient Instructions (Signed)
Dr. Marry Guan, [facility] in [place] Dr. Coralee North or Dr. Adriana Mccallum, [place]

## 2015-12-11 NOTE — Progress Notes (Signed)
   Dr. Frederico Hamman T. Jersey Ravenscroft, MD, [facility] [place], [address] Phone: [phone] Fax: [phone]  12/11/2015  Patient: Michele Cruz, MRN: [id number], DOB: Mar 16, 1956, 60 y.o.  Primary Physician:  Loura Pardon, MD   Chief Complaint  Patient presents with  . Knee Pain    Synvisc Injection   Subjective:   Michele Cruz is a 60 y.o. very pleasant female patient who presents with the following:  Known OA clinically and radiographically, advanced in character.   Loss of 5-8 deg of ext and flexion to 95 deg bilaterally. She has done reasonably well to steroid injections in the past but ? Reaction to both depo-medrol and decadron.  PROCEDURE ONLY  Knee Injection: Synvisc-One, RIGHT Patient verbally consented to procedure. Risks (including infection), benefits, and alternatives explained. Sterilely prepped with Chloraprep. Ethyl cholride used for anesthesia, then 7 cc of Lidocaine 1% used for anesthesia in the anterolateral position. Reprepped with Chloraprep.  Anterolateral approach used to inject joint without difficulty, injected with Synvisc-One, 6 mL. No complications with procedure and tolerated well.   Knee Injection: Synvisc-One, LEFT Patient verbally consented to procedure. Risks (including infection), benefits, and alternatives explained. Sterilely prepped with Chloraprep. Ethyl cholride used for anesthesia, then 7 cc of Lidocaine 1% used for anesthesia in the anterolateral position. Reprepped with Chloraprep.  Anterolateral approach used to inject joint without difficulty, injected with Synvisc-One, 6 mL. No complications with procedure and tolerated well.   Primary osteoarthritis of both knees - Plan: Hylan SOSY 48 Units, Hylan SOSY 48 Units   Follow-up: prn  Signed,  Brecken Walth T. Unknown Schleyer, MD   Patient's Medications  New Prescriptions   No medications on file  Previous Medications   AZELAIC ACID (FINACEA)  15 % FOAM    Apply 1 application topically daily.   LORATADINE (CLARITIN) 10 MG TABLET    Take 10 mg by mouth daily.   MAGNESIUM OXIDE (MAG-OX) 400 MG TABLET    Take 400 mg by mouth daily.     NAPROXEN SODIUM 220 MG CAPS    Take 1-2 capsules by mouth daily.   OMEGA-3 FATTY ACIDS (FISH OIL) 1200 MG CAPS    Take 1 capsule by mouth daily.    TURMERIC PO    Take by mouth 2 (two) times daily.  Modified Medications   No medications on file  Discontinued Medications   BUSPIRONE (BUSPAR) 15 MG TABLET    TAKE 1/2 TABLET BY MOUTH AT BEDTIME AS DIRECTED   IBUPROFEN (ADVIL,MOTRIN) 200 MG TABLET    Take 2 by mouth every morning

## 2016-02-27 ENCOUNTER — Other Ambulatory Visit: Payer: Self-pay

## 2016-02-27 NOTE — Telephone Encounter (Signed)
Please schedule annual exam for winter and refill until then 

## 2016-02-27 NOTE — Telephone Encounter (Signed)
Pt left v/m that Dr Glori Bickers refill Lawerance Cruel foam to [facility] [address]. Pt had previously gotten from dermatologist  But pt does not plan to see that dermatologist again. Pt last annual exam on 04/18/15. Pt request cb.

## 2016-02-28 NOTE — Telephone Encounter (Signed)
Left voicemail requesting pt to call the office back 

## 2016-08-08 ENCOUNTER — Ambulatory Visit (INDEPENDENT_AMBULATORY_CARE_PROVIDER_SITE_OTHER): Payer: BC Managed Care – PPO | Admitting: Family Medicine

## 2016-08-08 ENCOUNTER — Encounter: Payer: Self-pay | Admitting: Family Medicine

## 2016-08-08 DIAGNOSIS — B349 Viral infection, unspecified: Secondary | ICD-10-CM | POA: Diagnosis not present

## 2016-08-08 MED ORDER — HYDROCODONE-HOMATROPINE 5-1.5 MG/5ML PO SYRP: 5.0000 mL | ORAL_SOLUTION | Freq: Three times a day (TID) | ORAL | 0 refills | Status: DC | PRN

## 2016-08-08 MED ORDER — BENZONATATE 200 MG PO CAPS: 200.0000 mg | ORAL_CAPSULE | Freq: Three times a day (TID) | ORAL | 0 refills | Status: DC | PRN

## 2016-08-08 NOTE — Assessment & Plan Note (Signed)
Use tessalon for the cough.  If not better, try hycodan.  Update [place] as needed.  Rest and fluids.  Likely a nonflu virus.   If this is flu, then sx are relatively mild and she is clearly nontoxic.  Out of the window for likely utility of tamiflu, so flu test not needed.  D/w pt.  She agrees.

## 2016-08-08 NOTE — Patient Instructions (Signed)
Use tessalon for the cough.  If not better, try hycodan.  Update [place] as needed.  Rest and fluids.  Likely a nonflu virus.   Take care.  Glad to see you.

## 2016-08-08 NOTE — Progress Notes (Signed)
Sx started about 6 days ago.  Initially felt like she had a fever, then had a cough and non-debilitating body aches.  Sneezing.  Relatively quick onset of sx initially.  T max noted 99, on forehead.  Stuffy.  No vomiting.  No diarrhea. No rash.  She isn't better or worse but is fatigued from cough disrupting sleep.  Coughing to the point of urinary incontinence.  She has been able to work this week.  Minimal sputum.  No facial pain.    Meds, vitals, and allergies reviewed.   ROS: Per HPI unless specifically indicated in ROS section   GEN: nad, alert and oriented HEENT: mucous membranes moist, tm w/o erythema, nasal exam w/o erythema, clear discharge noted,  OP with cobblestoning, sinuses not ttp NECK: supple w/o LA CV: rrr.   PULM: ctab, no inc wob EXT: no edema

## 2016-11-30 ENCOUNTER — Telehealth: Payer: Self-pay | Admitting: Family Medicine

## 2016-11-30 DIAGNOSIS — Z0000 Encounter for general adult medical examination without abnormal findings: Secondary | ICD-10-CM

## 2016-11-30 DIAGNOSIS — E7800 Pure hypercholesterolemia, unspecified: Secondary | ICD-10-CM

## 2016-11-30 NOTE — Telephone Encounter (Signed)
-----   Message from Marchia Bond sent at 11/27/2016  1:36 PM EDT ----- Regarding: Cpx lab Mon 6/18, need orders. Thanks! :-) Please order  future cpx labs for pt's upcoming lab appt. Thanks Aniceto Boss

## 2016-12-08 ENCOUNTER — Other Ambulatory Visit (INDEPENDENT_AMBULATORY_CARE_PROVIDER_SITE_OTHER): Payer: BC Managed Care – PPO

## 2016-12-08 DIAGNOSIS — Z0000 Encounter for general adult medical examination without abnormal findings: Secondary | ICD-10-CM

## 2016-12-08 LAB — CBC WITH DIFFERENTIAL/PLATELET
BASOS ABS: 0.1 10*3/uL (ref 0.0–0.1)
Basophils Relative: 1.1 % (ref 0.0–3.0)
EOS ABS: 0.2 10*3/uL (ref 0.0–0.7)
Eosinophils Relative: 3.4 % (ref 0.0–5.0)
HCT: 43.2 % (ref 36.0–46.0)
HEMOGLOBIN: 14.6 g/dL (ref 12.0–15.0)
LYMPHS ABS: 1.8 10*3/uL (ref 0.7–4.0)
Lymphocytes Relative: 37 % (ref 12.0–46.0)
MCHC: 33.8 g/dL (ref 30.0–36.0)
MCV: 86.8 fl (ref 78.0–100.0)
MONO ABS: 0.4 10*3/uL (ref 0.1–1.0)
Monocytes Relative: 9 % (ref 3.0–12.0)
NEUTROS PCT: 49.5 % (ref 43.0–77.0)
Neutro Abs: 2.4 10*3/uL (ref 1.4–7.7)
Platelets: 184 10*3/uL (ref 150.0–400.0)
RBC: 4.98 Mil/uL (ref 3.87–5.11)
RDW: 14.1 % (ref 11.5–15.5)
WBC: 4.8 10*3/uL (ref 4.0–10.5)

## 2016-12-08 LAB — LIPID PANEL
Cholesterol: 230 mg/dL — ABNORMAL HIGH (ref 0–200)
HDL: 64.4 mg/dL (ref >39.00)
LDL Cholesterol: 149 mg/dL — ABNORMAL HIGH (ref 0–99)
NonHDL: 165.22
Total CHOL/HDL Ratio: 4
Triglycerides: 80 mg/dL (ref 0.0–149.0)
VLDL: 16 mg/dL (ref 0.0–40.0)

## 2016-12-08 LAB — COMPREHENSIVE METABOLIC PANEL
ALBUMIN: 4.3 g/dL (ref 3.5–5.2)
ALK PHOS: 56 U/L (ref 39–117)
ALT: 24 U/L (ref 0–35)
AST: 24 U/L (ref 0–37)
BUN: 27 mg/dL — AB (ref 6–23)
CO2: 30 mEq/L (ref 19–32)
CREATININE: 0.8 mg/dL (ref 0.40–1.20)
Calcium: 9.9 mg/dL (ref 8.4–10.5)
Chloride: 103 mEq/L (ref 96–112)
GFR: 77.66 mL/min (ref >60.00)
GLUCOSE: 99 mg/dL (ref 70–99)
POTASSIUM: 4 meq/L (ref 3.5–5.1)
SODIUM: 138 meq/L (ref 135–145)
TOTAL PROTEIN: 6.8 g/dL (ref 6.0–8.3)
Total Bilirubin: 0.7 mg/dL (ref 0.2–1.2)

## 2016-12-08 LAB — TSH: TSH: 4.28 u[IU]/mL (ref 0.35–4.50)

## 2016-12-09 ENCOUNTER — Encounter: Payer: Self-pay | Admitting: Family Medicine

## 2016-12-09 ENCOUNTER — Ambulatory Visit (INDEPENDENT_AMBULATORY_CARE_PROVIDER_SITE_OTHER): Payer: BC Managed Care – PPO | Admitting: Family Medicine

## 2016-12-09 ENCOUNTER — Other Ambulatory Visit (HOSPITAL_COMMUNITY)
Admission: RE | Admit: 2016-12-09 | Discharge: 2016-12-09 | Disposition: A | Discharge status: Home / Self Care | Payer: BC Managed Care – PPO | Source: Ambulatory Visit | Attending: Family Medicine | Admitting: Family Medicine

## 2016-12-09 VITALS — BP 120/70 | HR 67 | Temp 98.5°F | Ht 66.5 in | Wt 204.5 lb

## 2016-12-09 DIAGNOSIS — Z01419 Encounter for gynecological examination (general) (routine) without abnormal findings: Secondary | ICD-10-CM | POA: Diagnosis present

## 2016-12-09 DIAGNOSIS — Z0000 Encounter for general adult medical examination without abnormal findings: Secondary | ICD-10-CM

## 2016-12-09 DIAGNOSIS — E7800 Pure hypercholesterolemia, unspecified: Secondary | ICD-10-CM

## 2016-12-09 DIAGNOSIS — E669 Obesity, unspecified: Secondary | ICD-10-CM

## 2016-12-09 MED ORDER — AZELAIC ACID 15 % EX FOAM: 1.0000 "application " | Freq: Every day | CUTANEOUS | 11 refills | Status: DC

## 2016-12-09 MED ORDER — ROSUVASTATIN CALCIUM 5 MG PO TABS: 5.0000 mg | ORAL_TABLET | Freq: Every day | ORAL | 11 refills | Status: DC

## 2016-12-09 NOTE — Patient Instructions (Addendum)
Aim for exercise 5 days per week / walking and elliptical are good choices   Don't forget to schedule your mammogram (3D if possible)   There is a new shingles vaccine (Shingrix)  Unsure if covered by your insurance  In the next year it should be available again   See If you can afford crestor for cholesterol  If side effects-stop it and let us know  If you start it -schedule lab appt for about 6 weeks later Avoid red meat/ fried foods/ egg yolks/ fatty breakfast meats/ butter, cheese and high fat dairy/ and shellfish    Pap an gyn exam today

## 2016-12-09 NOTE — Progress Notes (Signed)
Subjective:    Patient ID: Michele Cruz, female    DOB: [birth year], 61 y.o.   MRN: [id number]  HPI Here for health maintenance exam and to review chronic medical problems    Doing ok - and she has decided to retire early  In the process of cleaning her room out  May work part time after   Feeling pretty good overall  Knees bother her- her synvisc shots lasted 6 months/ will do it again  Not ready for knee repl yet   Wt Readings from Last 3 Encounters:  12/09/16 204 lb 8 oz (92.8 kg)  08/08/16 195 lb (88.5 kg)  12/11/15 199 lb 4 oz (90.4 kg)  up 5 lb this year  He has eaten out for a week / usually does better  Some walking on the weekends  bmi 32.5  Hep C/HIV screen- does not think she is high risk and declines screening    Mammogram 7/16-she is overdue and will make her own appt  Self breast exam -no lumps or changes   Pap 7/15 neg with neg hpv screen No new partners No symptoms  Will do pap today  Colonoscopy 8/13 nl  7 year recall due to past polyps   Tetanus shot 3/12  Zoster status - has had shingles  Interested in shingrix when available    Hx of hyperlipidemia Lab Results  Component Value Date   CHOL 230 (H) 12/08/2016   CHOL 206 (H) 04/13/2015   CHOL 233 (H) 01/09/2014   Lab Results  Component Value Date   HDL 64.40 12/08/2016   HDL 62 04/13/2015   HDL 65.50 01/09/2014   Lab Results  Component Value Date   LDLCALC 149 (H) 12/08/2016   LDLCALC 126 04/13/2015   LDLCALC 154 (H) 01/09/2014   Lab Results  Component Value Date   TRIG 80.0 12/08/2016   TRIG 89 04/13/2015   TRIG 66.0 01/09/2014   Lab Results  Component Value Date   CHOLHDL 4 12/08/2016   CHOLHDL 3.3 04/13/2015   CHOLHDL 4 01/09/2014   Lab Results  Component Value Date   LDLDIRECT 152.4 03/27/2011   LDLDIRECT 150.6 03/20/2010   LDLDIRECT 150.4 12/11/2009   eats optimally most of the time  She did not eat well this week  Has a family hx of high cholesterol    Atorvastatin caused leg cramps and pain   Results for orders placed or performed in visit on 12/08/16  CBC with Differential/Platelet  Result Value Ref Range   WBC 4.8 4.0 - 10.5 K/uL   RBC 4.98 3.87 - 5.11 Mil/uL   Hemoglobin 14.6 12.0 - 15.0 g/dL   HCT 43.2 36.0 - 46.0 %   MCV 86.8 78.0 - 100.0 fl   MCHC 33.8 30.0 - 36.0 g/dL   RDW 14.1 11.5 - 15.5 %   Platelets 184.0 150.0 - 400.0 K/uL   Neutrophils Relative % 49.5 43.0 - 77.0 %   Lymphocytes Relative 37.0 12.0 - 46.0 %   Monocytes Relative 9.0 3.0 - 12.0 %   Eosinophils Relative 3.4 0.0 - 5.0 %   Basophils Relative 1.1 0.0 - 3.0 %   Neutro Abs 2.4 1.4 - 7.7 K/uL   Lymphs Abs 1.8 0.7 - 4.0 K/uL   Monocytes Absolute 0.4 0.1 - 1.0 K/uL   Eosinophils Absolute 0.2 0.0 - 0.7 K/uL   Basophils Absolute 0.1 0.0 - 0.1 K/uL  Comprehensive metabolic panel  Result Value Ref Range   Sodium 138 135 -  145 mEq/L   Potassium 4.0 3.5 - 5.1 mEq/L   Chloride 103 96 - 112 mEq/L   CO2 30 19 - 32 mEq/L   Glucose, Bld 99 70 - 99 mg/dL   BUN 27 (H) 6 - 23 mg/dL   Creatinine, Ser 0.80 0.40 - 1.20 mg/dL   Total Bilirubin 0.7 0.2 - 1.2 mg/dL   Alkaline Phosphatase 56 39 - 117 U/L   AST 24 0 - 37 U/L   ALT 24 0 - 35 U/L   Total Protein 6.8 6.0 - 8.3 g/dL   Albumin 4.3 3.5 - 5.2 g/dL   Calcium 9.9 8.4 - 10.5 mg/dL   GFR 77.66 >60.00 mL/min  Lipid panel  Result Value Ref Range   Cholesterol 230 (H) 0 - 200 mg/dL   Triglycerides 80.0 0.0 - 149.0 mg/dL   HDL 64.40 >39.00 mg/dL   VLDL 16.0 0.0 - 40.0 mg/dL   LDL Cholesterol 149 (H) 0 - 99 mg/dL   Total CHOL/HDL Ratio 4    NonHDL 165.22   TSH  Result Value Ref Range   TSH 4.28 0.35 - 4.50 uIU/mL     Patient Active Problem List   Diagnosis Date Noted  . Obesity (BMI 30-39.9) 12/09/2016  . Encounter for routine gynecological examination 01/10/2014  . Osteoarthritis of knee 06/21/2012  . Other screening mammogram 04/07/2011  . Routine general medical examination at a health care facility  03/25/2011  . HYPERCHOLESTEROLEMIA 12/11/2009  . COLONIC POLYPS, HX OF 08/03/2009  . 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 HERNIATED CERVICAL DISC 06/20/2009  . BACK PAIN, LUMBAR, WITH RADICULOPATHY 10/13/2008  . Generalized anxiety disorder 05/24/2007  . ALLERGIC RHINITIS 05/24/2007  . ROSACEA 05/24/2007  . CARDIAC MURMUR 05/24/2007   Past Medical History:  Diagnosis Date  . Allergic rhinitis   . Anemia, iron deficiency   . Anxiety   . Arthritis   . Asthma    no symptoms last 25 years  . Colon polyps   . Heavy menses   . Hemorrhoids   . History of exercise stress test 06/1999   neg  . History of MRI 2003   neg per patient  . History of MRI 2007   right quad fatty defect  . History of MRI of spine 03/2004   C-5, C5-6 disk protrusion  . Hyperlipidemia    LDL  . Rosacea   . Spondylosis    deg lumbar disc dz  . Zoster    Past Surgical History:  Procedure Laterality Date  . BREAST CYST ASPIRATION  03/2002  . BREAST CYST ASPIRATION  03/2004   left  . CESAREAN SECTION    . COLONOSCOPY    . KNEE SURGERY     left   Social History  Substance Use Topics  . Smoking status: Never Smoker  . Smokeless tobacco: Never Used  . Alcohol use 1.2 oz/week    2 Glasses of wine per week     Comment: 2-3 glasses wine or beer weekly per pt.   Family History  Problem Relation Age of Onset  . Arthritis Mother        OA  . Obesity Mother   . Heart disease Father        CAD  . Alcohol abuse Father   . Diabetes Father   . Hyperlipidemia Sister   . Diabetes Other   . Colon cancer Neg Hx    Allergies  Allergen Reactions  . Decadron [Dexamethasone] Other (See Comments)    Unsure  if related---swelling of throat  . Lipitor [Atorvastatin Calcium] Other (See Comments)    Leg cramps and pain  . Depo-Medrol [Methylprednisolone Acetate]     Rash after injection, a noted common SE from depo-medrol listed in epocrates and most resources  . Meloxicam     Pt felt dizzy potentially  from this, but does well on aleve  . Simvastatin     Possible transient memory changes, resolved off medicine   Current Outpatient Prescriptions on File Prior to Visit  Medication Sig Dispense Refill  . loratadine (CLARITIN) 10 MG tablet Take 10 mg by mouth daily.    . magnesium oxide (MAG-OX) 400 MG tablet Take 400 mg by mouth daily.      . Naproxen Sodium 220 MG CAPS Take 1-2 capsules by mouth daily.    . Omega-3 Fatty Acids (FISH OIL) 1200 MG CAPS Take 1 capsule by mouth daily.     . TURMERIC PO Take 1 tablet by mouth daily.      No current facility-administered medications on file prior to visit.     Review of Systems    Review of Systems  Constitutional: Negative for fever, appetite change, fatigue and unexpected weight change.  Eyes: Negative for pain and visual disturbance.  Respiratory: Negative for cough and shortness of breath.   Cardiovascular: Negative for cp or palpitations    Gastrointestinal: Negative for nausea, diarrhea and constipation.  Genitourinary: Negative for urgency and frequency.  Skin: Negative for pallor or rash  pos for facial rosacea  MSK pos for knee pain  Neurological: Negative for weakness, light-headedness, numbness and headaches.  Hematological: Negative for adenopathy. Does not bruise/bleed easily.  Psychiatric/Behavioral: Negative for dysphoric mood. The patient is not nervous/anxious.      Objective:   Physical Exam  Constitutional: She appears well-developed and well-nourished. No distress.  obese and well appearing   HENT:  Head: Normocephalic and atraumatic.  Right Ear: External ear normal.  Left Ear: External ear normal.  Mouth/Throat: Oropharynx is clear and moist.  Eyes: Conjunctivae and EOM are normal. Pupils are equal, round, and reactive to light. No scleral icterus.  Neck: Normal range of motion. Neck supple. No JVD present. Carotid bruit is not present. No thyromegaly present.  Cardiovascular: Normal rate, regular rhythm and  intact distal pulses.  Exam reveals no gallop.   Murmur heard. Pulmonary/Chest: Effort normal and breath sounds normal. No respiratory distress. She has no wheezes. She exhibits no tenderness.  Abdominal: Soft. Bowel sounds are normal. She exhibits no distension, no abdominal bruit and no mass. There is no tenderness.  Genitourinary: No breast swelling, tenderness, discharge or bleeding.  Genitourinary Comments: Breast exam: No mass, nodules, thickening, tenderness, bulging, retraction, inflamation, nipple discharge or skin changes noted.  No axillary or clavicular LA.             Anus appears normal w/o hemorrhoids or masses     External genitalia : nl appearance and hair distribution/no lesions     Urethral meatus : nl size, no lesions or prolapse     Urethra: no masses, tenderness or scarring    Bladder : no masses or tenderness     Vagina: nl general appearance, no discharge or  Lesions, no significant cystocele  or rectocele     Cervix: no lesions/ discharge or friability    Uterus: nl size, contour, position, and mobility (not fixed) , non tender    Adnexa : no masses, tenderness, enlargement or nodularity  Musculoskeletal: Normal range of motion. She exhibits no edema or tenderness.  Lymphadenopathy:    She has no cervical adenopathy.  Neurological: She is alert. She has normal reflexes. No cranial nerve deficit. She exhibits normal muscle tone. Coordination normal.  Skin: Skin is warm and dry. No rash noted. No erythema. No pallor.  Facial changes of rosacea  Psychiatric: She has a normal mood and affect.          Assessment & Plan:   Problem List Items Addressed This Visit      Other   Encounter for routine gynecological examination    Gyn exam done -no abn Pap obt -pending       Relevant Orders   Cytology - PAP   HYPERCHOLESTEROLEMIA    Disc goals for lipids and reasons to control them Rev labs with pt Rev low sat fat diet in  detail Intol of atorvastatin  Would like to try crestor if affordable  Update if side eff and re check 6 wk  Given written px and update       Relevant Medications   rosuvastatin (CRESTOR) 5 MG tablet   Obesity (BMI 30-39.9)    Discussed how this problem influences overall health and the risks it imposes  Reviewed plan for weight loss with lower calorie diet (via better food choices and also portion control or program like weight watchers) and exercise building up to or more than 30 minutes 5 days per week including some aerobic activity         Routine general medical examination at a health care facility - Primary    Reviewed health habits including diet and exercise and skin cancer prevention Reviewed appropriate screening tests for age  Also reviewed health mt list, fam hx and immunization status , as well as social and family history   See HPI Labs reviewed Gyn exam done today  She will schedule her own mammogram She may be interested in Shingrix when it becomes available Will try crestor for cholesterol low dose

## 2016-12-10 ENCOUNTER — Other Ambulatory Visit: Payer: Self-pay | Admitting: Family Medicine

## 2016-12-10 DIAGNOSIS — Z1231 Encounter for screening mammogram for malignant neoplasm of breast: Secondary | ICD-10-CM

## 2016-12-10 NOTE — Assessment & Plan Note (Signed)
Discussed how this problem influences overall health and the risks it imposes  Reviewed plan for weight loss with lower calorie diet (via better food choices and also portion control or program like weight watchers) and exercise building up to or more than 30 minutes 5 days per week including some aerobic activity    

## 2016-12-10 NOTE — Assessment & Plan Note (Signed)
Gyn exam done -no abn Pap obt -pending

## 2016-12-10 NOTE — Assessment & Plan Note (Signed)
Disc goals for lipids and reasons to control them Rev labs with pt Rev low sat fat diet in detail Intol of atorvastatin  Would like to try crestor if affordable  Update if side eff and re check 6 wk  Given written px and update

## 2016-12-10 NOTE — Assessment & Plan Note (Signed)
Reviewed health habits including diet and exercise and skin cancer prevention Reviewed appropriate screening tests for age  Also reviewed health mt list, fam hx and immunization status , as well as social and family history   See HPI Labs reviewed Gyn exam done today  She will schedule her own mammogram She may be interested in Shingrix when it becomes available Will try crestor for cholesterol low dose

## 2016-12-11 LAB — CYTOLOGY - PAP: Diagnosis: NEGATIVE

## 2016-12-30 ENCOUNTER — Ambulatory Visit
Admission: RE | Admit: 2016-12-30 | Discharge: 2016-12-30 | Disposition: A | Discharge status: Home / Self Care | Payer: BC Managed Care – PPO | Source: Ambulatory Visit | Attending: Family Medicine | Admitting: Family Medicine

## 2016-12-30 DIAGNOSIS — Z1231 Encounter for screening mammogram for malignant neoplasm of breast: Secondary | ICD-10-CM

## 2017-01-21 ENCOUNTER — Ambulatory Visit (INDEPENDENT_AMBULATORY_CARE_PROVIDER_SITE_OTHER)
Admission: RE | Admit: 2017-01-21 | Discharge: 2017-01-21 | Disposition: A | Discharge status: Home / Self Care | Payer: BC Managed Care – PPO | Source: Ambulatory Visit | Attending: Family Medicine | Admitting: Family Medicine

## 2017-01-21 ENCOUNTER — Ambulatory Visit (INDEPENDENT_AMBULATORY_CARE_PROVIDER_SITE_OTHER): Payer: BC Managed Care – PPO | Admitting: Family Medicine

## 2017-01-21 ENCOUNTER — Encounter: Payer: Self-pay | Admitting: Family Medicine

## 2017-01-21 VITALS — BP 100/70 | HR 86 | Temp 98.8°F | Ht 66.5 in | Wt 212.2 lb

## 2017-01-21 DIAGNOSIS — M25562 Pain in left knee: Secondary | ICD-10-CM

## 2017-01-21 NOTE — Progress Notes (Signed)
Dr. Frederico Hamman T. Freman Lapage, MD, [facility] [place], [address] Phone: [phone] Fax: [phone]  01/21/2017  Patient: Michele Cruz, MRN: [id number], DOB: [birth year], 61 y.o.  Primary Physician:  Tower, Wynelle Fanny, MD   Chief Complaint  Patient presents with  . Knee Pain    Left   Subjective:   Michele Cruz is a 61 y.o. very pleasant female patient who presents with the following:  L knee pain, known advanced OA. She has responded well to Synvisc previously. She is here today after an approximate 2-3 week history of left-sided knee pain. Initially she was not able to bear weight after she twisted her knee. Since then she has been off of crutches but limping over the last week or so. Pain is primarily medially. She has not had any swelling or bruising.  Past Medical History, Surgical History, Social History, Family History, Problem List, Medications, and Allergies have been reviewed and updated if relevant.  Patient Active Problem List   Diagnosis Date Noted  . Obesity (BMI 30-39.9) 12/09/2016  . Encounter for routine gynecological examination 01/10/2014  . Osteoarthritis of knee 06/21/2012  . Other screening mammogram 04/07/2011  . Routine general medical examination at a health care facility 03/25/2011  . HYPERCHOLESTEROLEMIA 12/11/2009  . COLONIC POLYPS, HX OF 08/03/2009  . 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 HERNIATED CERVICAL DISC 06/20/2009  . BACK PAIN, LUMBAR, WITH RADICULOPATHY 10/13/2008  . Generalized anxiety disorder 05/24/2007  . ALLERGIC RHINITIS 05/24/2007  . ROSACEA 05/24/2007  . CARDIAC MURMUR 05/24/2007    Past Medical History:  Diagnosis Date  . Allergic rhinitis   . Anemia, iron deficiency   . Anxiety   . Arthritis   . Asthma    no symptoms last 25 years  . Colon polyps   . Heavy menses   . Hemorrhoids   . History of exercise stress test 06/1999   neg  . History of MRI 2003   neg  per patient  . History of MRI 2007   right quad fatty defect  . History of MRI of spine 03/2004   C-5, C5-6 disk protrusion  . Hyperlipidemia    LDL  . Rosacea   . Spondylosis    deg lumbar disc dz  . Zoster     Past Surgical History:  Procedure Laterality Date  . BREAST CYST ASPIRATION  03/2002  . BREAST CYST ASPIRATION  03/2004   left  . CESAREAN SECTION    . COLONOSCOPY    . KNEE SURGERY     left    Social History   Social History  . Marital status: Married    Spouse name: N/A  . Number of children: 1  . Years of education: N/A   Occupational History  . 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 Smoking status: Never Smoker  . Smokeless tobacco: Never Used  . Alcohol use 1.2 oz/week    2 Glasses of wine per week     Comment: 2-3 glasses wine or beer weekly per pt.  . Drug use: No  . Sexual activity: Not on file   Other Topics Concern  . Not on file   Social History Narrative   One daughter   Daily caffeine; use 1-2 daily    Family History  Problem Relation Age of Onset  . Arthritis Mother        OA  .  Obesity Mother   . Heart disease Father        CAD  . Alcohol abuse Father   . Diabetes Father   . Hyperlipidemia Sister   . Diabetes Other   . Colon cancer Neg Hx     Allergies  Allergen Reactions  . Decadron [Dexamethasone] Other (See Comments)    Unsure if related---swelling of throat  . Lipitor [Atorvastatin Calcium] Other (See Comments)    Leg cramps and pain  . Depo-Medrol [Methylprednisolone Acetate]     Rash after injection, a noted common SE from depo-medrol listed in epocrates and most resources  . Meloxicam     Pt felt dizzy potentially from this, but does well on aleve  . Simvastatin     Possible transient memory changes, resolved off medicine    Medication list reviewed and updated in full in [place].  GEN: No fevers, chills. Nontoxic. Primarily MSK c/o today. MSK: Detailed in the  HPI GI: tolerating PO intake without difficulty Neuro: No numbness, parasthesias, or tingling associated. Otherwise the pertinent positives of the ROS are noted above.   Objective:   BP 100/70   Pulse 86   Temp 98.8 F (37.1 C) (Oral)   Ht 5' 6.5" (1.689 m)   Wt 212 lb 4 oz (96.3 kg)   BMI 33.74 kg/m    GEN: WDWN, NAD, Non-toxic, Alert & Oriented x 3 HEENT: Atraumatic, Normocephalic.  Ears and Nose: No external deformity. EXTR: No clubbing/cyanosis/edema NEURO: Normal gait.  PSYCH: Normally interactive. Conversant. Not depressed or anxious appearing.  Calm demeanor.    Left knee: Lacks a few degrees of extension. Flexion to 95. Stable to varus and valgus stress. Anterior cruciate ligament and PCL appear stable. Pain with full flexion and extension.  Radiology: Dg Knee Complete 4 Views Left  Result Date: 01/21/2017 CLINICAL DATA:  Left knee injury 3 weeks ago EXAM: LEFT KNEE - COMPLETE 4+ VIEW COMPARISON:  Left knee MRI of August 05, 2007 FINDINGS: The bones are subjectively adequately mineralized. There is moderate narrowing of the medial and patellofemoral compartments. There is beaking of the tibial spines. Spurs arise from the articular margins of the patella. There are prominent spurs arising from the superior aspect of the femoral condyles. There is no acute or healing fracture. There is no joint effusion. IMPRESSION: Moderate osteoarthritic changes of the medial and patellofemoral compartments. No acute bony abnormality. Electronically Signed   By: David  Martinique M.D.   On: 01/21/2017 11:40    Assessment and Plan:   Acute pain of left knee - Plan: DG Knee Complete 4 Views Left  Improved significantly with time alone. May simply have been bone bruise versus arthritis exacerbation. At this point I would continue to give this more time, and she feels that compression also helps.  Follow-up: No Follow-up on file.  Future Appointments Date Time Provider [place]    02/19/2017 9:00 AM Maley Venezia, Frederico Hamman, MD [id number]    Meds ordered this encounter  Medications  . SOOLANTRA 1 % CREA    Sig: APPLY TO THE FACE EVERY DAY    Refill:  6   Medications Discontinued During This Encounter  Medication Reason  . Azelaic Acid (FINACEA) 15 % FOAM Completed Course   Orders Placed This Encounter  Procedures  . DG Knee Complete 4 Views Left    Signed,  Burr Soffer T. Michaeljames Milnes, MD   Allergies as of 01/21/2017      Reactions   Decadron [dexamethasone] Other (See  Comments)   Unsure if related---swelling of throat   Lipitor [atorvastatin Calcium] Other (See Comments)   Leg cramps and pain   Depo-medrol [methylprednisolone Acetate]    Rash after injection, a noted common SE from depo-medrol listed in epocrates and most resources   Meloxicam    Pt felt dizzy potentially from this, but does well on aleve   Simvastatin    Possible transient memory changes, resolved off medicine      Medication List       Accurate as of 01/21/17  2:05 PM. Always use your most recent med list.          Fish Oil 1200 MG Caps Take 1 capsule by mouth daily.   loratadine 10 MG tablet Commonly known as:  CLARITIN Take 10 mg by mouth daily.   magnesium oxide 400 MG tablet Commonly known as:  MAG-OX Take 400 mg by mouth daily.   Naproxen Sodium 220 MG Caps Take 1-2 capsules by mouth daily.   rosuvastatin 5 MG tablet Commonly known as:  CRESTOR Take 1 tablet (5 mg total) by mouth daily.   SOOLANTRA 1 % Crea Generic drug:  Ivermectin APPLY TO THE FACE EVERY DAY   TURMERIC PO Take 1 tablet by mouth daily.

## 2017-02-19 ENCOUNTER — Encounter: Payer: Self-pay | Admitting: Family Medicine

## 2017-02-19 ENCOUNTER — Ambulatory Visit (INDEPENDENT_AMBULATORY_CARE_PROVIDER_SITE_OTHER): Payer: BC Managed Care – PPO | Admitting: Family Medicine

## 2017-02-19 VITALS — BP 104/78 | HR 82 | Temp 98.6°F | Ht 66.5 in | Wt 212.0 lb

## 2017-02-19 DIAGNOSIS — M170 Bilateral primary osteoarthritis of knee: Secondary | ICD-10-CM | POA: Diagnosis not present

## 2017-02-19 MED ORDER — HYLAN G-F 20 48 MG/6ML IX SOSY
48.0000 mg | PREFILLED_SYRINGE | Freq: Once | INTRA_ARTICULAR | Status: AC
  Administered 2017-02-19: 48 mg via INTRA_ARTICULAR

## 2017-02-19 NOTE — Progress Notes (Signed)
   Dr. Frederico Hamman T. Satcha Storlie, MD, [facility] [place], [address] Phone: [phone] Fax: [phone]  02/19/2017  Patient: Michele Cruz, MRN: [id number], DOB: May 18, 1956, 61 y.o.  Primary Physician:  Tower, Wynelle Fanny, MD   Chief Complaint  Patient presents with  . Knee Pain    Bilateral Synvisc Injections    Knee Injection: Synvisc-One, R Patient verbally consented to procedure. Risks (including infection), benefits, and alternatives explained. Sterilely prepped with Chloraprep. Ethyl cholride used for anesthesia, then 7 cc of Lidocaine 1% used for anesthesia in the anterolateral position. Reprepped with Chloraprep.  Anterolateral approach used to inject joint without difficulty, injected with Synvisc-One, 6 mL. No complications with procedure and tolerated well.   Knee Injection: Synvisc-On, L Patient verbally consented to procedure. Risks (including infection), benefits, and alternatives explained. Sterilely prepped with Chloraprep. Ethyl cholride used for anesthesia, then 7 cc of Lidocaine 1% used for anesthesia in the anterolateral position. Reprepped with Chloraprep.  Anterolateral approach used to inject joint without difficulty, injected with Synvisc-One, 6 mL. No complications with procedure and tolerated well.   Signed,  Maud Deed. Deno Sida, MD

## 2017-06-23 DIAGNOSIS — Z8614 Personal history of Methicillin resistant Staphylococcus aureus infection: Secondary | ICD-10-CM

## 2017-06-23 HISTORY — DX: Personal history of Methicillin resistant Staphylococcus aureus infection: Z86.14

## 2017-07-20 ENCOUNTER — Telehealth: Payer: Self-pay | Admitting: Family Medicine

## 2017-07-20 NOTE — Telephone Encounter (Signed)
Copied from [place]. Topic: Referral - Request >> Jul 20, 2017 11:32 AM Michele Cruz, Helene Kelp D wrote: Reason for CRM: Patient would like a referral to Gastroenterologist in [place]. Patient has been having issues, please call patient back, thanks.

## 2017-07-20 NOTE — Telephone Encounter (Signed)
I spoke with pt on 07/19/17 pt had normal BM but pt thought a lot of red blood in toilet bowl. Today BM and no blood seen. No abd pain. Pt scheduled appt to see Dr Glori Bickers on 07/21/17 at 10 AM. If pt condition worsens prior to appt pt will got to ED or UC. FYI to Dr Glori Bickers.

## 2017-07-20 NOTE — Telephone Encounter (Signed)
Ok- I will hold off on referral until I see her tomorrow

## 2017-07-21 ENCOUNTER — Encounter: Payer: Self-pay | Admitting: Family Medicine

## 2017-07-21 ENCOUNTER — Ambulatory Visit: Payer: BC Managed Care – PPO | Admitting: Family Medicine

## 2017-07-21 VITALS — BP 102/64 | HR 91 | Temp 98.7°F | Ht 66.5 in | Wt 208.0 lb

## 2017-07-21 DIAGNOSIS — L732 Hidradenitis suppurativa: Secondary | ICD-10-CM

## 2017-07-21 DIAGNOSIS — K648 Other hemorrhoids: Secondary | ICD-10-CM | POA: Insufficient documentation

## 2017-07-21 MED ORDER — HYDROCORTISONE ACETATE 25 MG RE SUPP: 25.0000 mg | Freq: Every day | RECTAL | 1 refills | Status: DC

## 2017-07-21 NOTE — Progress Notes (Signed)
Subjective:    Patient ID: Michele Cruz, female    DOB: 09/21/1955, 62 y.o.   MRN: [id number]  HPI Here for episode of blood in stool /rectal bleeding   Had a nl bm on Sunday -then went away  Blood in the toilet / bright red  Colored the water  When she wiped - it was dripping and stained her underwear during the day   No rectal pain or fullness   Some low abd soreness (from exercise only)  No n/v or diarrhea    Doing functional training / it does incorporate straining  /exercise  Not constipated  Usually once daily bm   Has had blood to wipe before   Had been on abx - made her gassy (bactrim for hydradenitis)   Wt Readings from Last 3 Encounters:  07/21/17 208 lb (94.3 kg)  02/19/17 212 lb (96.2 kg)  01/21/17 212 lb 4 oz (96.3 kg)   33.07 kg/m   Colonoscopy 2013 Internal hemorrhoids noted  Recall 7 y (polyp prior to that)     Patient Active Problem List   Diagnosis Date Noted  . Internal hemorrhoids 07/21/2017  . Hydradenitis 07/21/2017  . Obesity (BMI 30-39.9) 12/09/2016  . Encounter for routine gynecological examination 01/10/2014  . Osteoarthritis of knee 06/21/2012  . Other screening mammogram 04/07/2011  . Routine general medical examination at a health care facility 03/25/2011  . HYPERCHOLESTEROLEMIA 12/11/2009  . COLONIC POLYPS, HX OF 08/03/2009  . 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 HERNIATED CERVICAL DISC 06/20/2009  . BACK PAIN, LUMBAR, WITH RADICULOPATHY 10/13/2008  . Generalized anxiety disorder 05/24/2007  . ALLERGIC RHINITIS 05/24/2007  . ROSACEA 05/24/2007  . CARDIAC MURMUR 05/24/2007   Past Medical History:  Diagnosis Date  . Allergic rhinitis   . Anemia, iron deficiency   . Anxiety   . Arthritis   . Asthma    no symptoms last 25 years  . Colon polyps   . Heavy menses   . Hemorrhoids   . History of exercise stress test 06/1999   neg  . History of MRI 2003   neg per patient  . History of MRI 2007   right quad fatty  defect  . History of MRI of spine 03/2004   C-5, C5-6 disk protrusion  . Hyperlipidemia    LDL  . Rosacea   . Spondylosis    deg lumbar disc dz  . Zoster    Past Surgical History:  Procedure Laterality Date  . BREAST CYST ASPIRATION  03/2002  . BREAST CYST ASPIRATION  03/2004   left  . CESAREAN SECTION    . COLONOSCOPY    . KNEE SURGERY     left   Social History   Tobacco Use  . Smoking status: Never Smoker  . Smokeless tobacco: Never Used  Substance Use Topics  . Alcohol use: Yes    Alcohol/week: 1.2 oz    Types: 2 Glasses of wine per week    Comment: 2-3 glasses wine or beer weekly per pt.  . Drug use: No   Family History  Problem Relation Age of Onset  . Arthritis Mother        OA  . Obesity Mother   . Heart disease Father        CAD  . Alcohol abuse Father   . Diabetes Father   . Hyperlipidemia Sister   . Diabetes Other   . Colon cancer Neg Hx    Allergies  Allergen Reactions  .  Decadron [Dexamethasone] Other (See Comments)    Unsure if related---swelling of throat  . Lipitor [Atorvastatin Calcium] Other (See Comments)    Leg cramps and pain  . Depo-Medrol [Methylprednisolone Acetate]     Rash after injection, a noted common SE from depo-medrol listed in epocrates and most resources  . Meloxicam     Pt felt dizzy potentially from this, but does well on aleve  . Simvastatin     Possible transient memory changes, resolved off medicine   Current Outpatient Medications on File Prior to Visit  Medication Sig Dispense Refill  . fluconazole (DIFLUCAN) 200 MG tablet Take 1 tablet by mouth once a week.    . loratadine (CLARITIN) 10 MG tablet Take 10 mg by mouth daily.    . magnesium oxide (MAG-OX) 400 MG tablet Take 400 mg by mouth daily.      . Naproxen Sodium 220 MG CAPS Take 1-2 capsules by mouth daily.    . Omega-3 Fatty Acids (FISH OIL) 1200 MG CAPS Take 1 capsule by mouth daily.     . SOOLANTRA 1 % CREA APPLY TO THE FACE EVERY DAY  6  . TURMERIC PO  Take 1 tablet by mouth daily.      No current facility-administered medications on file prior to visit.     Review of Systems  Constitutional: Negative for activity change, appetite change, fatigue, fever and unexpected weight change.  HENT: Negative for congestion, ear pain, rhinorrhea, sinus pressure and sore throat.   Eyes: Negative for pain, redness and visual disturbance.  Respiratory: Negative for cough, shortness of breath and wheezing.   Cardiovascular: Negative for chest pain and palpitations.  Gastrointestinal: Positive for anal bleeding. Negative for abdominal distention, abdominal pain, blood in stool, constipation, diarrhea, nausea, rectal pain and vomiting.  Endocrine: Negative for polydipsia and polyuria.  Genitourinary: Negative for dysuria, frequency and urgency.  Musculoskeletal: Negative for arthralgias, back pain and myalgias.  Skin: Negative for pallor and rash.  Allergic/Immunologic: Negative for environmental allergies.  Neurological: Negative for dizziness, syncope and headaches.  Hematological: Negative for adenopathy. Does not bruise/bleed easily.  Psychiatric/Behavioral: Negative for decreased concentration and dysphoric mood. The patient is not nervous/anxious.        Objective:   Physical Exam  Constitutional: She appears well-developed and well-nourished. No distress.  obese and well appearing   HENT:  Head: Normocephalic and atraumatic.  Mouth/Throat: Oropharynx is clear and moist.  Eyes: Conjunctivae and EOM are normal. Pupils are equal, round, and reactive to light. No scleral icterus.  Neck: Normal range of motion. Neck supple.  Cardiovascular: Normal rate, regular rhythm and normal heart sounds.  Pulmonary/Chest: Effort normal and breath sounds normal. No respiratory distress. She has no wheezes. She has no rales.  Abdominal: Soft. Bowel sounds are normal. She exhibits no distension and no mass. There is no tenderness. There is no rebound and no  guarding.  Genitourinary:  Genitourinary Comments: Numerous non thrombosed ext hemorrhoids seen -nontender Rectal exam- no tenderness or mass noted Stool is heme neg    Musculoskeletal: She exhibits no edema.  Lymphadenopathy:    She has no cervical adenopathy.  Neurological: She is alert.  Skin: Skin is warm and dry. No erythema. No pallor.  Psychiatric: She has a normal mood and affect.          Assessment & Plan:   Problem List Items Addressed This Visit      Cardiovascular and Mediastinum   Internal hemorrhoids - Primary  Causing rectal bleeding-currently resolved  External hemorrhoids also seen on exam Disc avoidance of straining / constipation/ use of fiber and fluids anusol hc suppositories px  Update if symptoms worsen or return         Musculoskeletal and Integument   Hydradenitis    Pt mentioned this is a new diagnosis  Currently on bactrim from her dermatologist

## 2017-07-21 NOTE — Patient Instructions (Signed)
Avoid straining when possible  Eat fiber/ drink lots of fluids Try the anusol hc suppository for 7 days   If symptoms rebound let me know   Keep me posted

## 2017-07-23 NOTE — Assessment & Plan Note (Signed)
Causing rectal bleeding-currently resolved  External hemorrhoids also seen on exam Disc avoidance of straining / constipation/ use of fiber and fluids anusol hc suppositories px  Update if symptoms worsen or return

## 2017-07-23 NOTE — Assessment & Plan Note (Signed)
Pt mentioned this is a new diagnosis  Currently on bactrim from her dermatologist

## 2018-01-10 ENCOUNTER — Telehealth: Payer: Self-pay | Admitting: Family Medicine

## 2018-01-10 DIAGNOSIS — Z0000 Encounter for general adult medical examination without abnormal findings: Secondary | ICD-10-CM

## 2018-01-10 DIAGNOSIS — E7800 Pure hypercholesterolemia, unspecified: Secondary | ICD-10-CM

## 2018-01-10 NOTE — Telephone Encounter (Signed)
-----   Message from Ellamae Sia sent at 01/05/2018  2:26 PM EDT ----- Regarding: Lab orders for Monday, 7.22.19 Patient is scheduled for CPX labs, please order future labs, Thanks , Karna Christmas

## 2018-01-11 ENCOUNTER — Other Ambulatory Visit (INDEPENDENT_AMBULATORY_CARE_PROVIDER_SITE_OTHER): Payer: BC Managed Care – PPO

## 2018-01-11 DIAGNOSIS — Z0000 Encounter for general adult medical examination without abnormal findings: Secondary | ICD-10-CM | POA: Diagnosis not present

## 2018-01-11 DIAGNOSIS — E7800 Pure hypercholesterolemia, unspecified: Secondary | ICD-10-CM | POA: Diagnosis not present

## 2018-01-11 LAB — COMPREHENSIVE METABOLIC PANEL
ALBUMIN: 4.2 g/dL (ref 3.5–5.2)
ALK PHOS: 65 U/L (ref 39–117)
ALT: 13 U/L (ref 0–35)
AST: 15 U/L (ref 0–37)
BILIRUBIN TOTAL: 0.6 mg/dL (ref 0.2–1.2)
BUN: 25 mg/dL — AB (ref 6–23)
CALCIUM: 9.6 mg/dL (ref 8.4–10.5)
CO2: 28 mEq/L (ref 19–32)
CREATININE: 0.87 mg/dL (ref 0.40–1.20)
Chloride: 104 mEq/L (ref 96–112)
GFR: 70.24 mL/min (ref >60.00)
Glucose, Bld: 100 mg/dL — ABNORMAL HIGH (ref 70–99)
Potassium: 4.6 mEq/L (ref 3.5–5.1)
SODIUM: 139 meq/L (ref 135–145)
TOTAL PROTEIN: 7.1 g/dL (ref 6.0–8.3)

## 2018-01-11 LAB — CBC WITH DIFFERENTIAL/PLATELET
Basophils Absolute: 0.1 10*3/uL (ref 0.0–0.1)
Basophils Relative: 0.9 % (ref 0.0–3.0)
EOS ABS: 0.2 10*3/uL (ref 0.0–0.7)
EOS PCT: 3.5 % (ref 0.0–5.0)
HEMATOCRIT: 43.6 % (ref 36.0–46.0)
HEMOGLOBIN: 14.6 g/dL (ref 12.0–15.0)
LYMPHS PCT: 37 % (ref 12.0–46.0)
Lymphs Abs: 2 10*3/uL (ref 0.7–4.0)
MCHC: 33.5 g/dL (ref 30.0–36.0)
MCV: 87.3 fl (ref 78.0–100.0)
MONO ABS: 0.4 10*3/uL (ref 0.1–1.0)
Monocytes Relative: 7 % (ref 3.0–12.0)
Neutro Abs: 2.8 10*3/uL (ref 1.4–7.7)
Neutrophils Relative %: 51.6 % (ref 43.0–77.0)
Platelets: 197 10*3/uL (ref 150.0–400.0)
RBC: 5 Mil/uL (ref 3.87–5.11)
RDW: 13.8 % (ref 11.5–15.5)
WBC: 5.4 10*3/uL (ref 4.0–10.5)

## 2018-01-11 LAB — LIPID PANEL
CHOLESTEROL: 237 mg/dL — AB (ref 0–200)
HDL: 63.7 mg/dL (ref >39.00)
LDL CALC: 158 mg/dL — AB (ref 0–99)
NONHDL: 173.51
Total CHOL/HDL Ratio: 4
Triglycerides: 79 mg/dL (ref 0.0–149.0)
VLDL: 15.8 mg/dL (ref 0.0–40.0)

## 2018-01-11 LAB — TSH: TSH: 3.7 u[IU]/mL (ref 0.35–4.50)

## 2018-01-12 ENCOUNTER — Ambulatory Visit (INDEPENDENT_AMBULATORY_CARE_PROVIDER_SITE_OTHER): Payer: BC Managed Care – PPO | Admitting: Family Medicine

## 2018-01-12 ENCOUNTER — Encounter: Payer: Self-pay | Admitting: Family Medicine

## 2018-01-12 VITALS — BP 138/76 | HR 84 | Temp 98.2°F | Ht 66.75 in | Wt 210.8 lb

## 2018-01-12 DIAGNOSIS — E669 Obesity, unspecified: Secondary | ICD-10-CM | POA: Diagnosis not present

## 2018-01-12 DIAGNOSIS — E7800 Pure hypercholesterolemia, unspecified: Secondary | ICD-10-CM

## 2018-01-12 DIAGNOSIS — Z1231 Encounter for screening mammogram for malignant neoplasm of breast: Secondary | ICD-10-CM | POA: Diagnosis not present

## 2018-01-12 DIAGNOSIS — Z0000 Encounter for general adult medical examination without abnormal findings: Secondary | ICD-10-CM

## 2018-01-12 MED ORDER — ROSUVASTATIN CALCIUM 5 MG PO TABS: 5.0000 mg | ORAL_TABLET | Freq: Every day | ORAL | 11 refills | Status: DC

## 2018-01-12 NOTE — Progress Notes (Signed)
Subjective:    Patient ID: Michele Cruz, female    DOB: Oct 11, 1955, 62 y.o.   MRN: [id number]  HPI  Here for health maintenance exam and to review chronic medical problems   Doing ok overall  Feeling ok - perhaps a little low on energy   Wt Readings from Last 3 Encounters:  01/12/18 210 lb 12 oz (95.6 kg)  07/21/17 208 lb (94.3 kg)  02/19/17 212 lb (96.2 kg)  doing exercise HIIT - class 3 times per week for almost 9 months  Also eating healthy -doing better (oatmeal and a lot of salads and veg)  33.26 kg/m   Flu shot 10/18 Tetanus shot 1/12  Mammogram 7/18--does not have scheduled yet- she will call  [facility]  Self breast exam -no lumps   Colonoscopy 8/13- 7 year recall for hx of polyps   Pap 6/18-neg  No gyn problems or symptoms   Zoster status - has not had vaccine/has had zoster  Interested in shingrix    BP BP Readings from Last 3 Encounters:  01/12/18 138/76  07/21/17 102/64  02/19/17 104/78  was rushing to get here   Hyperlipidemia Lab Results  Component Value Date   CHOL 237 (H) 01/11/2018   CHOL 230 (H) 12/08/2016   CHOL 206 (H) 04/13/2015   Lab Results  Component Value Date   HDL 63.70 01/11/2018   HDL 64.40 12/08/2016   HDL 62 04/13/2015   Lab Results  Component Value Date   LDLCALC 158 (H) 01/11/2018   LDLCALC 149 (H) 12/08/2016   LDLCALC 126 04/13/2015   Lab Results  Component Value Date   TRIG 79.0 01/11/2018   TRIG 80.0 12/08/2016   TRIG 89 04/13/2015   Lab Results  Component Value Date   CHOLHDL 4 01/11/2018   CHOLHDL 4 12/08/2016   CHOLHDL 3.3 04/13/2015   Lab Results  Component Value Date   LDLDIRECT 152.4 03/27/2011   LDLDIRECT 150.6 03/20/2010   LDLDIRECT 150.4 12/11/2009   In the past atorvastatin caused leg cramps and pain  Eating well  This is likely hereditary   Other labs Results for orders placed or performed in visit on 01/11/18  TSH  Result Value Ref Range   TSH 3.70 0.35 - 4.50 uIU/mL  Lipid  panel  Result Value Ref Range   Cholesterol 237 (H) 0 - 200 mg/dL   Triglycerides 79.0 0.0 - 149.0 mg/dL   HDL 63.70 >39.00 mg/dL   VLDL 15.8 0.0 - 40.0 mg/dL   LDL Cholesterol 158 (H) 0 - 99 mg/dL   Total CHOL/HDL Ratio 4    NonHDL 173.51   CBC with Differential/Platelet  Result Value Ref Range   WBC 5.4 4.0 - 10.5 K/uL   RBC 5.00 3.87 - 5.11 Mil/uL   Hemoglobin 14.6 12.0 - 15.0 g/dL   HCT 43.6 36.0 - 46.0 %   MCV 87.3 78.0 - 100.0 fl   MCHC 33.5 30.0 - 36.0 g/dL   RDW 13.8 11.5 - 15.5 %   Platelets 197.0 150.0 - 400.0 K/uL   Neutrophils Relative % 51.6 43.0 - 77.0 %   Lymphocytes Relative 37.0 12.0 - 46.0 %   Monocytes Relative 7.0 3.0 - 12.0 %   Eosinophils Relative 3.5 0.0 - 5.0 %   Basophils Relative 0.9 0.0 - 3.0 %   Neutro Abs 2.8 1.4 - 7.7 K/uL   Lymphs Abs 2.0 0.7 - 4.0 K/uL   Monocytes Absolute 0.4 0.1 - 1.0 K/uL   Eosinophils  Absolute 0.2 0.0 - 0.7 K/uL   Basophils Absolute 0.1 0.0 - 0.1 K/uL  Comprehensive metabolic panel  Result Value Ref Range   Sodium 139 135 - 145 mEq/L   Potassium 4.6 3.5 - 5.1 mEq/L   Chloride 104 96 - 112 mEq/L   CO2 28 19 - 32 mEq/L   Glucose, Bld 100 (H) 70 - 99 mg/dL   BUN 25 (H) 6 - 23 mg/dL   Creatinine, Ser 0.87 0.40 - 1.20 mg/dL   Total Bilirubin 0.6 0.2 - 1.2 mg/dL   Alkaline Phosphatase 65 39 - 117 U/L   AST 15 0 - 37 U/L   ALT 13 0 - 35 U/L   Total Protein 7.1 6.0 - 8.3 g/dL   Albumin 4.2 3.5 - 5.2 g/dL   Calcium 9.6 8.4 - 10.5 mg/dL   GFR 70.24 >60.00 mL/min    Scheduled for L TKA Dr Marry Guan   Patient Active Problem List   Diagnosis Date Noted  . Internal hemorrhoids 07/21/2017  . Hydradenitis 07/21/2017  . Obesity (BMI 30-39.9) 12/09/2016  . Encounter for routine gynecological examination 01/10/2014  . Osteoarthritis of knee 06/21/2012  . Screening mammogram, encounter for 04/07/2011  . Routine general medical examination at a health care facility 03/25/2011  . HYPERCHOLESTEROLEMIA 12/11/2009  . COLONIC POLYPS,  HX OF 08/03/2009  . HERNIATED CERVICAL DISC 06/20/2009  . BACK PAIN, LUMBAR, WITH RADICULOPATHY 10/13/2008  . Generalized anxiety disorder 05/24/2007  . ALLERGIC RHINITIS 05/24/2007  . ROSACEA 05/24/2007  . CARDIAC MURMUR 05/24/2007   Past Medical History:  Diagnosis Date  . Allergic rhinitis   . Anemia, iron deficiency   . Anxiety   . Arthritis   . Asthma    no symptoms last 25 years  . Colon polyps   . Heavy menses   . Hemorrhoids   . History of exercise stress test 06/1999   neg  . History of MRI 2003   neg per patient  . History of MRI 2007   right quad fatty defect  . History of MRI of spine 03/2004   C-5, C5-6 disk protrusion  . Hyperlipidemia    LDL  . Rosacea   . Spondylosis    deg lumbar disc dz  . Zoster    Past Surgical History:  Procedure Laterality Date  . BREAST CYST ASPIRATION  03/2002  . BREAST CYST ASPIRATION  03/2004   left  . CESAREAN SECTION    . COLONOSCOPY    . KNEE SURGERY     left   Social History   Tobacco Use  . Smoking status: Never Smoker  . Smokeless tobacco: Never Used  Substance Use Topics  . Alcohol use: Yes    Alcohol/week: 1.2 oz    Types: 2 Glasses of wine per week    Comment: 2-3 glasses wine or beer weekly per pt.  . Drug use: No   Family History  Problem Relation Age of Onset  . Arthritis Mother        OA  . Obesity Mother   . Heart disease Father        CAD  . Alcohol abuse Father   . Diabetes Father   . Hyperlipidemia Sister   . Diabetes Other   . Colon cancer Neg Hx    Allergies  Allergen Reactions  . Decadron [Dexamethasone] Other (See Comments)    Unsure if related---swelling of throat  . Lipitor [Atorvastatin Calcium] Other (See Comments)    Leg cramps and pain  .  Depo-Medrol [Methylprednisolone Acetate]     Rash after injection, a noted common SE from depo-medrol listed in epocrates and most resources  . Meloxicam     Pt felt dizzy potentially from this, but does well on aleve  . Simvastatin       Possible transient memory changes, resolved off medicine   Current Outpatient Medications on File Prior to Visit  Medication Sig Dispense Refill  . Carbamide Peroxide (EAR DROPS OT) Place 1 drop in ear(s) daily as needed.    . COLLAGEN PO Take 4 tablets by mouth daily.    . fluconazole (DIFLUCAN) 200 MG tablet Take 1 tablet by mouth once a week.    . hydrocortisone (ANUSOL-HC) 25 MG suppository Place 1 suppository (25 mg total) rectally at bedtime. 7 suppository 1  . loratadine (CLARITIN) 10 MG tablet Take 10 mg by mouth daily.    . magnesium oxide (MAG-OX) 400 MG tablet Take 400 mg by mouth daily.      . Naproxen Sodium 220 MG CAPS Take 1-2 capsules by mouth daily.    . Omega-3 Fatty Acids (FISH OIL) 1200 MG CAPS Take 1 capsule by mouth daily.     . SOOLANTRA 1 % CREA APPLY TO THE FACE EVERY DAY  6  . TURMERIC PO Take 1 tablet by mouth daily.      No current facility-administered medications on file prior to visit.      Review of Systems  Constitutional: Negative for activity change, appetite change, fatigue, fever and unexpected weight change.  HENT: Negative for congestion, ear pain, rhinorrhea, sinus pressure and sore throat.   Eyes: Negative for pain, redness and visual disturbance.  Respiratory: Negative for cough, shortness of breath and wheezing.   Cardiovascular: Negative for chest pain and palpitations.  Gastrointestinal: Negative for abdominal pain, blood in stool, constipation and diarrhea.       Occ hemorrhoids   Endocrine: Negative for polydipsia and polyuria.  Genitourinary: Negative for dysuria, frequency and urgency.  Musculoskeletal: Positive for arthralgias. Negative for back pain and myalgias.       Bilateral knee pain   Skin: Negative for pallor and rash.  Allergic/Immunologic: Negative for environmental allergies.  Neurological: Negative for dizziness, syncope and headaches.  Hematological: Negative for adenopathy. Does not bruise/bleed easily.   Psychiatric/Behavioral: Negative for decreased concentration and dysphoric mood. The patient is not nervous/anxious.        Objective:   Physical Exam  Constitutional: She appears well-developed and well-nourished. No distress.  obese and well appearing   HENT:  Head: Normocephalic and atraumatic.  Right Ear: External ear normal.  Left Ear: External ear normal.  Mouth/Throat: Oropharynx is clear and moist.  Eyes: Pupils are equal, round, and reactive to light. Conjunctivae and EOM are normal. No scleral icterus.  Neck: Normal range of motion. Neck supple. No JVD present. Carotid bruit is not present. No thyromegaly present.  Cardiovascular: Normal rate, regular rhythm and intact distal pulses. Exam reveals no gallop.  Murmur heard. Pulmonary/Chest: Effort normal and breath sounds normal. No respiratory distress. She has no wheezes. She exhibits no tenderness. No breast tenderness, discharge or bleeding.  Abdominal: Soft. Bowel sounds are normal. She exhibits no distension, no abdominal bruit and no mass. There is no tenderness.  Genitourinary: No breast tenderness, discharge or bleeding.  Genitourinary Comments: Breast exam: No mass, nodules, thickening, tenderness, bulging, retraction, inflamation, nipple discharge or skin changes noted.  No axillary or clavicular LA.      Musculoskeletal: She exhibits  no edema, tenderness or deformity.  Some knee stiffness with gait   Lymphadenopathy:    She has no cervical adenopathy.  Neurological: She is alert. She has normal reflexes. She displays normal reflexes. No cranial nerve deficit. She exhibits normal muscle tone. Coordination normal.  Skin: Skin is warm and dry. No rash noted. No erythema. No pallor.  Solar lentigines diffusely  Mildly tanned   Psychiatric: She has a normal mood and affect.  Pleasant           Assessment & Plan:   Problem List Items Addressed This Visit      Other   HYPERCHOLESTEROLEMIA    Disc goals for  lipids and reasons to control them Rev last labs with pt Rev low sat fat diet in detail  LDL is up  Intol of atorvastatin and simvastatin in the past Will try low dose crestor (stop and call if side eff)  Lab in 6 weeks       Relevant Medications   rosuvastatin (CRESTOR) 5 MG tablet   Obesity (BMI 30-39.9)    Discussed how this problem influences overall health and the risks it imposes  Reviewed plan for weight loss with lower calorie diet (via better food choices and also portion control or program like weight watchers) and exercise building up to or more than 30 minutes 5 days per week including some aerobic activity         Routine general medical examination at a health care facility - Primary    Reviewed health habits including diet and exercise and skin cancer prevention Reviewed appropriate screening tests for age  Also reviewed health mt list, fam hx and immunization status , as well as social and family history   See HPI Labs reviewed  Pt will schedule her own mammogram  Colonoscopy due 8/20 Disc shingrix vaccine  Disc diet/exercise       Screening mammogram, encounter for

## 2018-01-12 NOTE — Patient Instructions (Addendum)
Don't forget to schedule your mammogram   If you are interested in the new shingles vaccine (Shingrix) - call your local pharmacy to check on coverage and availability  If affordable- get on the wait list for the shot   I want to try crestor for cholesterol  If It causes pain or cramps or any other side effect -- hold it and call us   Avoid red meat/ fried foods/ egg yolks/ fatty breakfast meats/ butter, cheese and high fat dairy/ and shellfish   Keep up the good exercise

## 2018-01-12 NOTE — Assessment & Plan Note (Signed)
Reviewed health habits including diet and exercise and skin cancer prevention Reviewed appropriate screening tests for age  Also reviewed health mt list, fam hx and immunization status , as well as social and family history   See HPI Labs reviewed  Pt will schedule her own mammogram  Colonoscopy due 8/20 Disc shingrix vaccine  Disc diet/exercise

## 2018-01-12 NOTE — Assessment & Plan Note (Signed)
Disc goals for lipids and reasons to control them Rev last labs with pt Rev low sat fat diet in detail  LDL is up  Intol of atorvastatin and simvastatin in the past Will try low dose crestor (stop and call if side eff)  Lab in 6 weeks

## 2018-01-12 NOTE — Assessment & Plan Note (Signed)
Discussed how this problem influences overall health and the risks it imposes  Reviewed plan for weight loss with lower calorie diet (via better food choices and also portion control or program like weight watchers) and exercise building up to or more than 30 minutes 5 days per week including some aerobic activity    

## 2018-02-13 NOTE — Discharge Instructions (Signed)
°  Instructions after Total Knee Replacement ° ° Lysle Yero P. Shana Younge, Jr., M.D.    ° Dept. of Orthopaedics & Sports Medicine ° [facility] ° [address] ° [place], [place]  [address] ° Phone: [phone]   Fax: [phone] ° °  °DIET: °• Drink plenty of non-alcoholic fluids. °• Resume your normal diet. Include foods high in fiber. ° °ACTIVITY:  °• You may use crutches or a walker with weight-bearing as tolerated, unless instructed otherwise. °• You may be weaned off of the walker or crutches by your Physical Therapist.  °• Do NOT place pillows under the knee. Anything placed under the knee could limit your ability to straighten the knee.   °• Continue doing gentle exercises. Exercising will reduce the pain and swelling, increase motion, and prevent muscle weakness.   °• Please continue to use the TED compression stockings for 6 weeks. You may remove the stockings at night, but should reapply them in the morning. °• Do not drive or operate any equipment until instructed. ° °WOUND CARE:  °• Continue to use the PolarCare or ice packs periodically to reduce pain and swelling. °• You may bathe or shower after the staples are removed at the first office visit following surgery. ° °MEDICATIONS: °• You may resume your regular medications. °• Please take the pain medication as prescribed on the medication. °• Do not take pain medication on an empty stomach. °• You have been given a prescription for a blood thinner (Lovenox or Coumadin). Please take the medication as instructed. (NOTE: After completing a 2 week course of Lovenox, take one Enteric-coated aspirin once a day. This along with elevation will help reduce the possibility of phlebitis in your operated leg.) °• Do not drive or drink alcoholic beverages when taking pain medications. ° °CALL THE OFFICE FOR: °• Temperature above 101 degrees °• Excessive bleeding or drainage on the dressing. °• Excessive swelling, coldness, or paleness of the toes. °• Persistent  nausea and vomiting. ° °FOLLOW-UP:  °• You should have an appointment to return to the office in 10-14 days after surgery. °• Arrangements have been made for continuation of Physical Therapy (either home therapy or outpatient therapy). °  °

## 2018-02-15 ENCOUNTER — Telehealth: Payer: Self-pay | Admitting: Family Medicine

## 2018-02-15 NOTE — Telephone Encounter (Signed)
Pt just walked in and wanted to know when does she need to come in to have labs due to being on the statin medication. Pt stated you can call her back to advise her of appt for her labs.

## 2018-02-15 NOTE — Telephone Encounter (Signed)
Per CPE on 01/12/18 pt is due for labs 6 weeks after that appt. appt scheduled with pt

## 2018-03-01 ENCOUNTER — Other Ambulatory Visit (INDEPENDENT_AMBULATORY_CARE_PROVIDER_SITE_OTHER): Payer: BC Managed Care – PPO

## 2018-03-01 DIAGNOSIS — E7800 Pure hypercholesterolemia, unspecified: Secondary | ICD-10-CM | POA: Diagnosis not present

## 2018-03-01 LAB — LIPID PANEL
CHOL/HDL RATIO: 2
Cholesterol: 152 mg/dL (ref 0–200)
HDL: 66.2 mg/dL (ref >39.00)
LDL Cholesterol: 75 mg/dL (ref 0–99)
NONHDL: 85.76
Triglycerides: 55 mg/dL (ref 0.0–149.0)
VLDL: 11 mg/dL (ref 0.0–40.0)

## 2018-03-01 LAB — AST: AST: 19 U/L (ref 0–37)

## 2018-03-01 LAB — ALT: ALT: 17 U/L (ref 0–35)

## 2018-03-03 ENCOUNTER — Encounter
Admission: RE | Admit: 2018-03-03 | Discharge: 2018-03-03 | Disposition: A | Discharge status: Home / Self Care | Payer: BC Managed Care – PPO | Source: Ambulatory Visit | Attending: Orthopedic Surgery | Admitting: Orthopedic Surgery

## 2018-03-03 ENCOUNTER — Other Ambulatory Visit: Payer: Self-pay

## 2018-03-03 DIAGNOSIS — M1712 Unilateral primary osteoarthritis, left knee: Secondary | ICD-10-CM | POA: Diagnosis not present

## 2018-03-03 DIAGNOSIS — Z01818 Encounter for other preprocedural examination: Secondary | ICD-10-CM | POA: Diagnosis present

## 2018-03-03 HISTORY — DX: Partial loss of teeth, unspecified cause, unspecified class: K08.409

## 2018-03-03 HISTORY — DX: Family history of other specified conditions: Z84.89

## 2018-03-03 HISTORY — DX: Hidradenitis suppurativa: L73.2

## 2018-03-03 LAB — COMPREHENSIVE METABOLIC PANEL
ALT: 18 U/L (ref 0–44)
AST: 24 U/L (ref 15–41)
Albumin: 4.4 g/dL (ref 3.5–5.0)
Alkaline Phosphatase: 63 U/L (ref 38–126)
Anion gap: 7 (ref 5–15)
BUN: 22 mg/dL (ref 8–23)
CHLORIDE: 106 mmol/L (ref 98–111)
CO2: 28 mmol/L (ref 22–32)
Calcium: 9.4 mg/dL (ref 8.9–10.3)
Creatinine, Ser: 0.66 mg/dL (ref 0.44–1.00)
Glucose, Bld: 104 mg/dL — ABNORMAL HIGH (ref 70–99)
POTASSIUM: 4.3 mmol/L (ref 3.5–5.1)
Sodium: 141 mmol/L (ref 135–145)
TOTAL PROTEIN: 7.3 g/dL (ref 6.5–8.1)
Total Bilirubin: 1 mg/dL (ref 0.3–1.2)

## 2018-03-03 LAB — CBC
HEMATOCRIT: 41.7 % (ref 35.0–47.0)
Hemoglobin: 14.2 g/dL (ref 12.0–16.0)
MCH: 29.5 pg (ref 26.0–34.0)
MCHC: 34.1 g/dL (ref 32.0–36.0)
MCV: 86.6 fL (ref 80.0–100.0)
PLATELETS: 178 10*3/uL (ref 150–440)
RBC: 4.82 MIL/uL (ref 3.80–5.20)
RDW: 14.6 % — AB (ref 11.5–14.5)
WBC: 4.8 10*3/uL (ref 3.6–11.0)

## 2018-03-03 LAB — URINALYSIS, ROUTINE W REFLEX MICROSCOPIC
Bilirubin Urine: NEGATIVE
GLUCOSE, UA: NEGATIVE mg/dL
Hgb urine dipstick: NEGATIVE
Ketones, ur: NEGATIVE mg/dL
LEUKOCYTES UA: NEGATIVE
NITRITE: NEGATIVE
PH: 5 (ref 5.0–8.0)
Protein, ur: NEGATIVE mg/dL
SPECIFIC GRAVITY, URINE: 1.02 (ref 1.005–1.030)

## 2018-03-03 LAB — TYPE AND SCREEN
ABO/RH(D): A POS
ANTIBODY SCREEN: NEGATIVE

## 2018-03-03 LAB — SURGICAL PCR SCREEN
MRSA, PCR: POSITIVE — AB
Staphylococcus aureus: POSITIVE — AB

## 2018-03-03 LAB — APTT: aPTT: 27 seconds (ref 24–36)

## 2018-03-03 LAB — SEDIMENTATION RATE: SED RATE: 7 mm/h (ref 0–30)

## 2018-03-03 LAB — PROTIME-INR
INR: 0.94
PROTHROMBIN TIME: 12.5 s (ref 11.4–15.2)

## 2018-03-03 LAB — C-REACTIVE PROTEIN

## 2018-03-03 NOTE — Patient Instructions (Signed)
Your procedure is scheduled on: Monday 03/15/18 Report to [place]. To find out your arrival time please call [phone] between 1PM - 3PM on Friday 03/12/18.  Remember: Instructions that are not followed completely may result in serious medical risk, up to and including death, or upon the discretion of your surgeon and anesthesiologist your surgery may need to be rescheduled.     _X__ 1. Do not eat food after midnight the night before your procedure.                 No gum chewing or hard candies. You may drink clear liquids up to 2 hours                 before you are scheduled to arrive for your surgery- DO not drink clear                 liquids within 2 hours of the start of your surgery.                 Clear Liquids include:  water, apple juice without pulp, clear carbohydrate                 drink such as Clearfast or Gatorade, Black Coffee or Tea (Do not add                 anything to coffee or tea).  __X__2.  On the morning of surgery brush your teeth with toothpaste and water, you                 may rinse your mouth with mouthwash if you wish.  Do not swallow any              toothpaste of mouthwash.     _X__ 3.  No Alcohol for 24 hours before or after surgery.   _X__ 4.  Do Not Smoke or use e-cigarettes For 24 Hours Prior to Your Surgery.                 Do not use any chewable tobacco products for at least 6 hours prior to                 surgery.  ____  5.  Bring all medications with you on the day of surgery if instructed.   __X__  6.  Notify your doctor if there is any change in your medical condition      (cold, fever, infections).     Do not wear jewelry, make-up, hairpins, clips or nail polish. Do not wear lotions, powders, or perfumes.  Do not shave 48 hours prior to surgery. Men may shave face and neck. Do not bring valuables to the hospital.    [facility] is not responsible for any belongings or  valuables.  Contacts, dentures/partials or body piercings may not be worn into surgery. Bring a case for your contacts, glasses or hearing aids, a denture cup will be supplied. Leave your suitcase in the car. After surgery it may be brought to your room. For patients admitted to the hospital, discharge time is determined by your treatment team.   Patients discharged the day of surgery will not be allowed to drive home.   Please read over the following fact sheets that you were given:   MRSA Information  __X__ Take these medicines the morning of surgery with A SIP OF WATER:  1. Loratadine if needed  2.   3.   4.  5.  6.  ____ Fleet Enema (as directed)   __X__ Use CHG Soap/SAGE wipes as directed  ____ Use inhalers on the day of surgery  ____ Stop metformin/Janumet/Farxiga 2 days prior to surgery    ____ Take 1/2 of usual insulin dose the night before surgery. No insulin the morning          of surgery.   ____ Stop Blood Thinners Coumadin/Plavix/Xarelto/Pleta/Pradaxa/Eliquis/Effient/Aspirin  on   Or contact your Surgeon, Cardiologist or Medical Doctor regarding  ability to stop your blood thinners  __X__ Stop Anti-inflammatories 7 days before surgery such as Advil, Ibuprofen, Motrin,  BC or Goodies Powder, Naprosyn, Naproxen, Aleve, Aspirin    __X__ Stop all herbal supplements, fish oil or vitamin E until after surgery. Stop turmeric, fish oil, collagen   ____ Bring C-Pap to the hospital.

## 2018-03-04 LAB — URINE CULTURE
Culture: NO GROWTH
Special Requests: NORMAL

## 2018-03-04 NOTE — Pre-Procedure Instructions (Signed)
POSITIVE MRSA/STAPH FAXED TO DR Marry Guan

## 2018-03-14 MED ORDER — CEFAZOLIN SODIUM-DEXTROSE 2-4 GM/100ML-% IV SOLN: 2.0000 g | INTRAVENOUS | Status: DC

## 2018-03-14 MED ORDER — VANCOMYCIN HCL IN DEXTROSE 1-5 GM/200ML-% IV SOLN
1000.0000 mg | Freq: Once | INTRAVENOUS | Status: DC
  Administered 2018-03-15: 1000 mg via INTRAVENOUS

## 2018-03-14 MED ORDER — TRANEXAMIC ACID 1000 MG/10ML IV SOLN
1000.0000 mg | INTRAVENOUS | Status: DC
  Filled 2018-03-14: qty 10

## 2018-03-15 ENCOUNTER — Other Ambulatory Visit: Payer: Self-pay

## 2018-03-15 ENCOUNTER — Inpatient Hospital Stay: Payer: BC Managed Care – PPO

## 2018-03-15 ENCOUNTER — Inpatient Hospital Stay: Payer: BC Managed Care – PPO | Admitting: Certified Registered"

## 2018-03-15 ENCOUNTER — Encounter: Payer: Self-pay | Admitting: Orthopedic Surgery

## 2018-03-15 ENCOUNTER — Inpatient Hospital Stay
Admission: RE | Admit: 2018-03-15 | Discharge: 2018-03-17 | DRG: 470 | Disposition: A | Discharge status: Home Health | Payer: BC Managed Care – PPO | Attending: Orthopedic Surgery | Admitting: Orthopedic Surgery

## 2018-03-15 ENCOUNTER — Encounter
Admission: RE | Disposition: A | Discharge status: Home Health | Payer: Self-pay | Source: Home / Self Care | Attending: Orthopedic Surgery

## 2018-03-15 DIAGNOSIS — Z86010 Personal history of colonic polyps: Secondary | ICD-10-CM

## 2018-03-15 DIAGNOSIS — E669 Obesity, unspecified: Secondary | ICD-10-CM | POA: Diagnosis present

## 2018-03-15 DIAGNOSIS — E785 Hyperlipidemia, unspecified: Secondary | ICD-10-CM | POA: Diagnosis present

## 2018-03-15 DIAGNOSIS — F419 Anxiety disorder, unspecified: Secondary | ICD-10-CM | POA: Diagnosis present

## 2018-03-15 DIAGNOSIS — Z811 Family history of alcohol abuse and dependence: Secondary | ICD-10-CM | POA: Diagnosis not present

## 2018-03-15 DIAGNOSIS — Z888 Allergy status to other drugs, medicaments and biological substances status: Secondary | ICD-10-CM

## 2018-03-15 DIAGNOSIS — D509 Iron deficiency anemia, unspecified: Secondary | ICD-10-CM | POA: Diagnosis present

## 2018-03-15 DIAGNOSIS — Z6833 Body mass index (BMI) 33.0-33.9, adult: Secondary | ICD-10-CM | POA: Diagnosis not present

## 2018-03-15 DIAGNOSIS — M25762 Osteophyte, left knee: Secondary | ICD-10-CM | POA: Diagnosis present

## 2018-03-15 DIAGNOSIS — M1712 Unilateral primary osteoarthritis, left knee: Principal | ICD-10-CM | POA: Diagnosis present

## 2018-03-15 DIAGNOSIS — Z96652 Presence of left artificial knee joint: Secondary | ICD-10-CM

## 2018-03-15 DIAGNOSIS — Z833 Family history of diabetes mellitus: Secondary | ICD-10-CM | POA: Diagnosis not present

## 2018-03-15 DIAGNOSIS — Z8249 Family history of ischemic heart disease and other diseases of the circulatory system: Secondary | ICD-10-CM | POA: Diagnosis not present

## 2018-03-15 DIAGNOSIS — E7800 Pure hypercholesterolemia, unspecified: Secondary | ICD-10-CM | POA: Diagnosis present

## 2018-03-15 DIAGNOSIS — Z96659 Presence of unspecified artificial knee joint: Secondary | ICD-10-CM

## 2018-03-15 HISTORY — PX: KNEE ARTHROPLASTY: SHX992

## 2018-03-15 LAB — ABO/RH: ABO/RH(D): A POS

## 2018-03-15 SURGERY — ARTHROPLASTY, KNEE, TOTAL, USING IMAGELESS COMPUTER-ASSISTED NAVIGATION
Anesthesia: Spinal | Site: Knee | Laterality: Left

## 2018-03-15 MED ORDER — ACETAMINOPHEN 325 MG PO TABS: 325.0000 mg | ORAL_TABLET | Freq: Four times a day (QID) | ORAL | Status: DC | PRN

## 2018-03-15 MED ORDER — BISACODYL 10 MG RE SUPP
10.0000 mg | Freq: Every day | RECTAL | Status: DC | PRN
  Administered 2018-03-17: 10 mg via RECTAL
  Filled 2018-03-15 (×2): qty 1

## 2018-03-15 MED ORDER — MENTHOL 3 MG MT LOZG
1.0000 | LOZENGE | OROMUCOSAL | Status: DC | PRN
  Filled 2018-03-15: qty 9

## 2018-03-15 MED ORDER — POLYVINYL ALCOHOL 1.4 % OP SOLN
1.0000 [drp] | Freq: Two times a day (BID) | OPHTHALMIC | Status: DC | PRN
  Filled 2018-03-15: qty 15

## 2018-03-15 MED ORDER — CELECOXIB 200 MG PO CAPS
400.0000 mg | ORAL_CAPSULE | Freq: Once | ORAL | Status: AC
  Administered 2018-03-15: 400 mg via ORAL

## 2018-03-15 MED ORDER — PHENOL 1.4 % MT LIQD
1.0000 | OROMUCOSAL | Status: DC | PRN
  Filled 2018-03-15: qty 177

## 2018-03-15 MED ORDER — TRAMADOL HCL 50 MG PO TABS
50.0000 mg | ORAL_TABLET | ORAL | Status: DC | PRN
  Administered 2018-03-17: 100 mg via ORAL
  Filled 2018-03-15: qty 2

## 2018-03-15 MED ORDER — IVERMECTIN 1 % EX CREA: 1.0000 "application " | TOPICAL_CREAM | Freq: Every day | CUTANEOUS | Status: DC

## 2018-03-15 MED ORDER — SENNOSIDES-DOCUSATE SODIUM 8.6-50 MG PO TABS
1.0000 | ORAL_TABLET | Freq: Two times a day (BID) | ORAL | Status: DC
  Administered 2018-03-15 – 2018-03-17 (×4): 1 via ORAL
  Filled 2018-03-15 (×4): qty 1

## 2018-03-15 MED ORDER — ROSUVASTATIN CALCIUM 10 MG PO TABS
5.0000 mg | ORAL_TABLET | Freq: Every day | ORAL | Status: DC
  Administered 2018-03-15 – 2018-03-17 (×3): 5 mg via ORAL
  Filled 2018-03-15 (×3): qty 1

## 2018-03-15 MED ORDER — PROPOFOL 10 MG/ML IV BOLUS
INTRAVENOUS | Status: DC | PRN
  Administered 2018-03-15 (×2): 10 mg via INTRAVENOUS

## 2018-03-15 MED ORDER — GABAPENTIN 300 MG PO CAPS
300.0000 mg | ORAL_CAPSULE | Freq: Every day | ORAL | Status: DC
  Administered 2018-03-15 – 2018-03-16 (×2): 300 mg via ORAL
  Filled 2018-03-15 (×2): qty 1

## 2018-03-15 MED ORDER — SODIUM CHLORIDE 0.9 % IV SOLN
INTRAVENOUS | Status: DC
  Administered 2018-03-15 – 2018-03-16 (×2): via INTRAVENOUS

## 2018-03-15 MED ORDER — METOCLOPRAMIDE HCL 10 MG PO TABS: 5.0000 mg | ORAL_TABLET | Freq: Three times a day (TID) | ORAL | Status: DC | PRN

## 2018-03-15 MED ORDER — MIDAZOLAM HCL 5 MG/5ML IJ SOLN
INTRAMUSCULAR | Status: DC | PRN
  Administered 2018-03-15 (×2): 2 mg via INTRAVENOUS

## 2018-03-15 MED ORDER — GLYCOPYRROLATE 0.2 MG/ML IJ SOLN
INTRAMUSCULAR | Status: AC
  Filled 2018-03-15: qty 1

## 2018-03-15 MED ORDER — TETRACAINE HCL 1 % IJ SOLN
INTRAMUSCULAR | Status: DC | PRN
  Administered 2018-03-15: 6 mg via INTRASPINAL

## 2018-03-15 MED ORDER — VANCOMYCIN HCL IN DEXTROSE 1-5 GM/200ML-% IV SOLN
INTRAVENOUS | Status: AC
  Administered 2018-03-15: 1000 mg via INTRAVENOUS
  Filled 2018-03-15: qty 200

## 2018-03-15 MED ORDER — CEFAZOLIN SODIUM-DEXTROSE 2-4 GM/100ML-% IV SOLN
INTRAVENOUS | Status: AC
  Filled 2018-03-15: qty 100

## 2018-03-15 MED ORDER — MIDAZOLAM HCL 2 MG/2ML IJ SOLN
INTRAMUSCULAR | Status: AC
  Filled 2018-03-15: qty 2

## 2018-03-15 MED ORDER — CELECOXIB 200 MG PO CAPS
200.0000 mg | ORAL_CAPSULE | Freq: Two times a day (BID) | ORAL | Status: DC
  Administered 2018-03-15 – 2018-03-17 (×4): 200 mg via ORAL
  Filled 2018-03-15 (×4): qty 1

## 2018-03-15 MED ORDER — SODIUM CHLORIDE 0.9 % IV SOLN
INTRAVENOUS | Status: DC | PRN
  Administered 2018-03-15: 60 mL

## 2018-03-15 MED ORDER — LORATADINE 10 MG PO TABS
10.0000 mg | ORAL_TABLET | Freq: Every day | ORAL | Status: DC
  Administered 2018-03-16 – 2018-03-17 (×2): 10 mg via ORAL
  Filled 2018-03-15 (×2): qty 1

## 2018-03-15 MED ORDER — BUPIVACAINE HCL (PF) 0.5 % IJ SOLN
INTRAMUSCULAR | Status: DC | PRN
  Administered 2018-03-15: 2.4 mL via INTRATHECAL

## 2018-03-15 MED ORDER — FENTANYL CITRATE (PF) 100 MCG/2ML IJ SOLN: 25.0000 ug | INTRAMUSCULAR | Status: DC | PRN

## 2018-03-15 MED ORDER — MAGNESIUM OXIDE 400 (241.3 MG) MG PO TABS
400.0000 mg | ORAL_TABLET | Freq: Every day | ORAL | Status: DC
  Administered 2018-03-15 – 2018-03-17 (×3): 400 mg via ORAL
  Filled 2018-03-15 (×3): qty 1

## 2018-03-15 MED ORDER — CHLORHEXIDINE GLUCONATE 4 % EX LIQD: 60.0000 mL | Freq: Once | CUTANEOUS | Status: DC

## 2018-03-15 MED ORDER — FLEET ENEMA 7-19 GM/118ML RE ENEM: 1.0000 | ENEMA | Freq: Once | RECTAL | Status: DC | PRN

## 2018-03-15 MED ORDER — TETRACAINE HCL 1 % IJ SOLN
INTRAMUSCULAR | Status: AC
  Filled 2018-03-15: qty 2

## 2018-03-15 MED ORDER — ONDANSETRON HCL 4 MG PO TABS: 4.0000 mg | ORAL_TABLET | Freq: Four times a day (QID) | ORAL | Status: DC | PRN

## 2018-03-15 MED ORDER — CEFAZOLIN SODIUM-DEXTROSE 2-4 GM/100ML-% IV SOLN
2.0000 g | Freq: Four times a day (QID) | INTRAVENOUS | Status: AC
  Administered 2018-03-15 – 2018-03-16 (×4): 2 g via INTRAVENOUS
  Filled 2018-03-15 (×4): qty 100

## 2018-03-15 MED ORDER — FAMOTIDINE 20 MG PO TABS
20.0000 mg | ORAL_TABLET | Freq: Once | ORAL | Status: AC
  Administered 2018-03-15: 20 mg via ORAL

## 2018-03-15 MED ORDER — RISAQUAD PO CAPS
1.0000 | ORAL_CAPSULE | Freq: Every day | ORAL | Status: DC
  Administered 2018-03-16 – 2018-03-17 (×2): 1 via ORAL
  Filled 2018-03-15 (×3): qty 1

## 2018-03-15 MED ORDER — TRANEXAMIC ACID 1000 MG/10ML IV SOLN
1000.0000 mg | Freq: Once | INTRAVENOUS | Status: AC
  Administered 2018-03-15: 1000 mg via INTRAVENOUS
  Filled 2018-03-15: qty 1000

## 2018-03-15 MED ORDER — ACETAMINOPHEN 10 MG/ML IV SOLN
INTRAVENOUS | Status: AC
  Filled 2018-03-15: qty 100

## 2018-03-15 MED ORDER — CELECOXIB 200 MG PO CAPS
ORAL_CAPSULE | ORAL | Status: AC
  Administered 2018-03-15: 400 mg via ORAL
  Filled 2018-03-15: qty 2

## 2018-03-15 MED ORDER — FAMOTIDINE 20 MG PO TABS
ORAL_TABLET | ORAL | Status: AC
  Administered 2018-03-15: 20 mg via ORAL
  Filled 2018-03-15: qty 1

## 2018-03-15 MED ORDER — CEFAZOLIN SODIUM-DEXTROSE 2-3 GM-%(50ML) IV SOLR
INTRAVENOUS | Status: DC | PRN
  Administered 2018-03-15: 2 g via INTRAVENOUS

## 2018-03-15 MED ORDER — FENTANYL CITRATE (PF) 100 MCG/2ML IJ SOLN
INTRAMUSCULAR | Status: AC
  Filled 2018-03-15: qty 2

## 2018-03-15 MED ORDER — LACTATED RINGERS IV SOLN
INTRAVENOUS | Status: DC
  Administered 2018-03-15 (×2): via INTRAVENOUS

## 2018-03-15 MED ORDER — METOCLOPRAMIDE HCL 5 MG/ML IJ SOLN: 5.0000 mg | Freq: Three times a day (TID) | INTRAMUSCULAR | Status: DC | PRN

## 2018-03-15 MED ORDER — BUPIVACAINE HCL (PF) 0.5 % IJ SOLN
INTRAMUSCULAR | Status: AC
  Filled 2018-03-15: qty 10

## 2018-03-15 MED ORDER — MAGNESIUM HYDROXIDE 400 MG/5ML PO SUSP
30.0000 mL | Freq: Every day | ORAL | Status: DC
  Administered 2018-03-15 – 2018-03-17 (×3): 30 mL via ORAL
  Filled 2018-03-15 (×4): qty 30

## 2018-03-15 MED ORDER — PROPOFOL 500 MG/50ML IV EMUL
INTRAVENOUS | Status: DC | PRN
  Administered 2018-03-15: 25 ug/kg/min via INTRAVENOUS

## 2018-03-15 MED ORDER — ACETAMINOPHEN 10 MG/ML IV SOLN
1000.0000 mg | Freq: Four times a day (QID) | INTRAVENOUS | Status: AC
  Administered 2018-03-15 – 2018-03-16 (×3): 1000 mg via INTRAVENOUS
  Filled 2018-03-15 (×4): qty 100

## 2018-03-15 MED ORDER — ENOXAPARIN SODIUM 30 MG/0.3ML ~~LOC~~ SOLN
30.0000 mg | Freq: Two times a day (BID) | SUBCUTANEOUS | Status: DC
  Administered 2018-03-16 – 2018-03-17 (×3): 30 mg via SUBCUTANEOUS
  Filled 2018-03-15 (×3): qty 0.3

## 2018-03-15 MED ORDER — FLUCONAZOLE 100 MG PO TABS: 200.0000 mg | ORAL_TABLET | ORAL | Status: DC

## 2018-03-15 MED ORDER — BUPIVACAINE HCL (PF) 0.25 % IJ SOLN
INTRAMUSCULAR | Status: DC | PRN
  Administered 2018-03-15: 60 mL

## 2018-03-15 MED ORDER — TRANEXAMIC ACID 1000 MG/10ML IV SOLN
INTRAVENOUS | Status: DC | PRN
  Administered 2018-03-15: 1000 mg via INTRAVENOUS

## 2018-03-15 MED ORDER — DEXAMETHASONE SODIUM PHOSPHATE 10 MG/ML IJ SOLN
8.0000 mg | Freq: Once | INTRAMUSCULAR | Status: AC
  Administered 2018-03-15: 8 mg via INTRAVENOUS

## 2018-03-15 MED ORDER — DEXAMETHASONE SODIUM PHOSPHATE 10 MG/ML IJ SOLN
INTRAMUSCULAR | Status: AC
  Administered 2018-03-15: 8 mg via INTRAVENOUS
  Filled 2018-03-15: qty 1

## 2018-03-15 MED ORDER — ACETAMINOPHEN 10 MG/ML IV SOLN
INTRAVENOUS | Status: DC | PRN
  Administered 2018-03-15: 1000 mg via INTRAVENOUS

## 2018-03-15 MED ORDER — FERROUS SULFATE 325 (65 FE) MG PO TABS
325.0000 mg | ORAL_TABLET | Freq: Two times a day (BID) | ORAL | Status: DC
  Administered 2018-03-15 – 2018-03-17 (×3): 325 mg via ORAL
  Filled 2018-03-15 (×3): qty 1

## 2018-03-15 MED ORDER — OXYCODONE HCL 5 MG PO TABS
5.0000 mg | ORAL_TABLET | ORAL | Status: DC | PRN
  Administered 2018-03-16 – 2018-03-17 (×7): 5 mg via ORAL
  Filled 2018-03-15 (×7): qty 1

## 2018-03-15 MED ORDER — ONDANSETRON HCL 4 MG/2ML IJ SOLN: 4.0000 mg | Freq: Once | INTRAMUSCULAR | Status: DC | PRN

## 2018-03-15 MED ORDER — GLYCOPYRROLATE 0.2 MG/ML IJ SOLN
INTRAMUSCULAR | Status: DC | PRN
  Administered 2018-03-15: 0.2 mg via INTRAVENOUS

## 2018-03-15 MED ORDER — PANTOPRAZOLE SODIUM 40 MG PO TBEC
40.0000 mg | DELAYED_RELEASE_TABLET | Freq: Two times a day (BID) | ORAL | Status: DC
  Administered 2018-03-15 – 2018-03-17 (×4): 40 mg via ORAL
  Filled 2018-03-15 (×4): qty 1

## 2018-03-15 MED ORDER — PROPOFOL 500 MG/50ML IV EMUL
INTRAVENOUS | Status: AC
  Filled 2018-03-15: qty 50

## 2018-03-15 MED ORDER — ONDANSETRON HCL 4 MG/2ML IJ SOLN: 4.0000 mg | Freq: Four times a day (QID) | INTRAMUSCULAR | Status: DC | PRN

## 2018-03-15 MED ORDER — FENTANYL CITRATE (PF) 100 MCG/2ML IJ SOLN
INTRAMUSCULAR | Status: DC | PRN
  Administered 2018-03-15 (×2): 50 ug via INTRAVENOUS

## 2018-03-15 MED ORDER — DIPHENHYDRAMINE HCL 12.5 MG/5ML PO ELIX: 12.5000 mg | ORAL_SOLUTION | ORAL | Status: DC | PRN

## 2018-03-15 MED ORDER — HYDROMORPHONE HCL 1 MG/ML IJ SOLN: 0.5000 mg | INTRAMUSCULAR | Status: DC | PRN

## 2018-03-15 MED ORDER — ALUM & MAG HYDROXIDE-SIMETH 200-200-20 MG/5ML PO SUSP: 30.0000 mL | ORAL | Status: DC | PRN

## 2018-03-15 MED ORDER — NEOMYCIN-POLYMYXIN B GU 40-200000 IR SOLN
Status: DC | PRN
  Administered 2018-03-15: 14 mL

## 2018-03-15 MED ORDER — METOCLOPRAMIDE HCL 10 MG PO TABS
10.0000 mg | ORAL_TABLET | Freq: Three times a day (TID) | ORAL | Status: DC
  Administered 2018-03-15 – 2018-03-17 (×4): 10 mg via ORAL
  Filled 2018-03-15 (×5): qty 1

## 2018-03-15 MED ORDER — OXYCODONE HCL 5 MG PO TABS: 10.0000 mg | ORAL_TABLET | ORAL | Status: DC | PRN

## 2018-03-15 MED ORDER — GABAPENTIN 300 MG PO CAPS
300.0000 mg | ORAL_CAPSULE | Freq: Once | ORAL | Status: AC
  Administered 2018-03-15: 300 mg via ORAL

## 2018-03-15 MED ORDER — GABAPENTIN 300 MG PO CAPS
ORAL_CAPSULE | ORAL | Status: AC
  Administered 2018-03-15: 300 mg via ORAL
  Filled 2018-03-15: qty 1

## 2018-03-15 SURGICAL SUPPLY — 70 items
ATTUNE MED DOME PAT 41 KNEE (Knees) ×2 IMPLANT
ATTUNE MED DOME PAT 41MM KNEE (Knees) ×1 IMPLANT
ATTUNE PS FEM LT SZ 8 CEM KNEE (Femur) ×3 IMPLANT
ATTUNE PSRP INSR SZ8 5 KNEE (Insert) ×2 IMPLANT
ATTUNE PSRP INSR SZ8 5MM KNEE (Insert) ×1 IMPLANT
BASE TIBIAL ROT PLAT SZ 7 KNEE (Knees) ×1 IMPLANT
BATTERY INSTRU NAVIGATION (MISCELLANEOUS) ×12 IMPLANT
BLADE SAW 70X12.5 (BLADE) ×3 IMPLANT
BLADE SAW 90X13X1.19 OSCILLAT (BLADE) ×3 IMPLANT
BLADE SAW 90X25X1.19 OSCILLAT (BLADE) ×3 IMPLANT
BONE CEMENT GENTAMICIN (Cement) ×6 IMPLANT
CANISTER SUCT 1200ML W/VALVE (MISCELLANEOUS) ×3 IMPLANT
CANISTER SUCT 3000ML PPV (MISCELLANEOUS) ×6 IMPLANT
CEMENT BONE GENTAMICIN 40 (Cement) ×2 IMPLANT
COOLER POLAR GLACIER W/PUMP (MISCELLANEOUS) ×3 IMPLANT
CUFF TOURN 24 STER (MISCELLANEOUS) IMPLANT
CUFF TOURN 30 STER DUAL PORT (MISCELLANEOUS) IMPLANT
DRAPE SHEET LG 3/4 BI-LAMINATE (DRAPES) ×3 IMPLANT
DRSG DERMACEA 8X12 NADH (GAUZE/BANDAGES/DRESSINGS) ×3 IMPLANT
DRSG OPSITE POSTOP 4X14 (GAUZE/BANDAGES/DRESSINGS) ×3 IMPLANT
DRSG TEGADERM 4X4.75 (GAUZE/BANDAGES/DRESSINGS) ×3 IMPLANT
DURAPREP 26ML APPLICATOR (WOUND CARE) ×6 IMPLANT
ELECT CAUTERY BLADE 6.4 (BLADE) ×3 IMPLANT
ELECT REM PT RETURN 9FT ADLT (ELECTROSURGICAL) ×3
ELECTRODE REM PT RTRN 9FT ADLT (ELECTROSURGICAL) ×1 IMPLANT
EX-PIN ORTHOLOCK NAV 4X150 (PIN) ×6 IMPLANT
GLOVE BIOGEL M STRL SZ7.5 (GLOVE) ×6 IMPLANT
GLOVE BIOGEL PI IND STRL 9 (GLOVE) ×1 IMPLANT
GLOVE BIOGEL PI INDICATOR 9 (GLOVE) ×2
GLOVE INDICATOR 8.0 STRL GRN (GLOVE) ×3 IMPLANT
GLOVE SURG SYN 9.0  PF PI (GLOVE) ×2
GLOVE SURG SYN 9.0 PF PI (GLOVE) ×1 IMPLANT
GOWN STRL REUS W/ TWL LRG LVL3 (GOWN DISPOSABLE) ×2 IMPLANT
GOWN STRL REUS W/TWL 2XL LVL3 (GOWN DISPOSABLE) ×3 IMPLANT
GOWN STRL REUS W/TWL LRG LVL3 (GOWN DISPOSABLE) ×4
HEMOVAC 400CC 10FR (MISCELLANEOUS) ×3 IMPLANT
HOLDER FOLEY CATH W/STRAP (MISCELLANEOUS) ×3 IMPLANT
HOOD PEEL AWAY FLYTE STAYCOOL (MISCELLANEOUS) ×6 IMPLANT
KIT TURNOVER KIT A (KITS) ×3 IMPLANT
KNIFE SCULPS 14X20 (INSTRUMENTS) ×3 IMPLANT
LABEL OR SOLS (LABEL) ×3 IMPLANT
NDL SAFETY ECLIPSE 18X1.5 (NEEDLE) ×1 IMPLANT
NEEDLE HYPO 18GX1.5 SHARP (NEEDLE) ×2
NEEDLE SPNL 20GX3.5 QUINCKE YW (NEEDLE) ×6 IMPLANT
NS IRRIG 500ML POUR BTL (IV SOLUTION) ×3 IMPLANT
PACK TOTAL KNEE (MISCELLANEOUS) ×3 IMPLANT
PAD WRAPON POLAR KNEE (MISCELLANEOUS) ×1 IMPLANT
PIN DRILL QUICK PACK ×3 IMPLANT
PIN FIXATION 1/8DIA X 3INL (PIN) ×9 IMPLANT
PULSAVAC PLUS IRRIG FAN TIP (DISPOSABLE) ×3
SOL .9 NS 3000ML IRR  AL (IV SOLUTION) ×2
SOL .9 NS 3000ML IRR UROMATIC (IV SOLUTION) ×1 IMPLANT
SOL PREP PVP 2OZ (MISCELLANEOUS) ×3
SOLUTION PREP PVP 2OZ (MISCELLANEOUS) ×1 IMPLANT
SPONGE DRAIN TRACH 4X4 STRL 2S (GAUZE/BANDAGES/DRESSINGS) ×3 IMPLANT
STAPLER SKIN PROX 35W (STAPLE) ×3 IMPLANT
STRAP TIBIA SHORT (MISCELLANEOUS) ×3 IMPLANT
SUCTION FRAZIER HANDLE 10FR (MISCELLANEOUS) ×2
SUCTION TUBE FRAZIER 10FR DISP (MISCELLANEOUS) ×1 IMPLANT
SUT VIC AB 0 CT1 36 (SUTURE) ×3 IMPLANT
SUT VIC AB 1 CT1 36 (SUTURE) ×6 IMPLANT
SUT VIC AB 2-0 CT2 27 (SUTURE) ×3 IMPLANT
SYR 20CC LL (SYRINGE) ×3 IMPLANT
SYR 30ML LL (SYRINGE) ×6 IMPLANT
TIBIAL BASE ROT PLAT SZ 7 KNEE (Knees) ×3 IMPLANT
TIP FAN IRRIG PULSAVAC PLUS (DISPOSABLE) ×1 IMPLANT
TOWEL OR 17X26 4PK STRL BLUE (TOWEL DISPOSABLE) ×3 IMPLANT
TOWER CARTRIDGE SMART MIX (DISPOSABLE) ×3 IMPLANT
TRAY FOLEY MTR SLVR 16FR STAT (SET/KITS/TRAYS/PACK) ×3 IMPLANT
WRAPON POLAR PAD KNEE (MISCELLANEOUS) ×3

## 2018-03-15 NOTE — Anesthesia Preprocedure Evaluation (Addendum)
Anesthesia Evaluation  Patient identified by MRN, date of birth, ID band Patient awake    Reviewed: Allergy & Precautions, NPO status , Patient's Chart, lab work & pertinent test results  History of Anesthesia Complications (+) Family history of anesthesia reaction  Airway Mallampati: III  TM Distance: <3 FB     Dental  (+) Teeth Intact   Pulmonary asthma ,    Pulmonary exam normal        Cardiovascular Normal cardiovascular exam+ Valvular Problems/Murmurs      Neuro/Psych PSYCHIATRIC DISORDERS Anxiety  Neuromuscular disease    GI/Hepatic negative GI ROS, Neg liver ROS,   Endo/Other  negative endocrine ROS  Renal/GU negative Renal ROS     Musculoskeletal  (+) Arthritis , Osteoarthritis,    Abdominal Normal abdominal exam  (+)   Peds  Hematology  (+) anemia ,   Anesthesia Other Findings   Reproductive/Obstetrics                            Anesthesia Physical Anesthesia Plan  ASA: II  Anesthesia Plan: Spinal   Post-op Pain Management:    Induction: Intravenous  PONV Risk Score and Plan:   Airway Management Planned: Nasal Cannula  Additional Equipment:   Intra-op Plan:   Post-operative Plan:   Informed Consent: I have reviewed the patients History and Physical, chart, labs and discussed the procedure including the risks, benefits and alternatives for the proposed anesthesia with the patient or authorized representative who has indicated his/her understanding and acceptance.   Dental advisory given  Plan Discussed with: CRNA and Surgeon  Anesthesia Plan Comments:         Anesthesia Quick Evaluation

## 2018-03-15 NOTE — H&P (Signed)
The patient has been re-examined, and the chart reviewed, and there have been no interval changes to the documented history and physical.    The risks, benefits, and alternatives have been discussed at length. The patient expressed understanding of the risks benefits and agreed with plans for surgical intervention.  Laloni Rowton P. Ras Kollman, Jr. M.D.    

## 2018-03-15 NOTE — Op Note (Signed)
OPERATIVE NOTE  DATE OF SURGERY:  03/15/2018  PATIENT NAME:  Michele Cruz   DOB: [birth year]  MRN: [id number]  PRE-OPERATIVE DIAGNOSIS: Degenerative arthrosis of the left knee, primary  POST-OPERATIVE DIAGNOSIS:  Same  PROCEDURE:  Left total knee arthroplasty using computer-assisted navigation  SURGEON:  Marciano Sequin. M.D.  ASSISTANT:  Vance Peper, PA (present and scrubbed throughout the case, critical for assistance with exposure, retraction, instrumentation, and closure)  ANESTHESIA: spinal  ESTIMATED BLOOD LOSS: 50 mL  FLUIDS REPLACED: 1200 mL of crystalloid  TOURNIQUET TIME: 87 minutes  DRAINS: 2 medium Hemovac drains  SOFT TISSUE RELEASES: Anterior cruciate ligament, posterior cruciate ligament, deep medial collateral ligament, patellofemoral ligament  IMPLANTS UTILIZED: DePuy Attune size 8 posterior stabilized femoral component (cemented), size 7 rotating platform tibial component (cemented), 41 mm medialized dome patella (cemented), and a 5 mm stabilized rotating platform polyethylene insert.  INDICATIONS FOR SURGERY: Michele Cruz is a 62 y.o. year old female with a long history of progressive knee pain. X-rays demonstrated severe degenerative changes in tricompartmental fashion. The patient had not seen any significant improvement despite conservative nonsurgical intervention. After discussion of the risks and benefits of surgical intervention, the patient expressed understanding of the risks benefits and agree with plans for total knee arthroplasty.   The risks, benefits, and alternatives were discussed at length including but not limited to the risks of infection, bleeding, nerve injury, stiffness, blood clots, the need for revision surgery, cardiopulmonary complications, among others, and they were willing to proceed.  PROCEDURE IN DETAIL: The patient was brought into the operating room and, after adequate spinal anesthesia was achieved, a tourniquet was placed on  the patient's upper thigh. The patient's knee and leg were cleaned and prepped with alcohol and DuraPrep and draped in the usual sterile fashion. A "timeout" was performed as per usual protocol. The lower extremity was exsanguinated using an Esmarch, and the tourniquet was inflated to 300 mmHg. An anterior longitudinal incision was made followed by a standard mid vastus approach. The deep fibers of the medial collateral ligament were elevated in a subperiosteal fashion off of the medial flare of the tibia so as to maintain a continuous soft tissue sleeve. The patella was subluxed laterally and the patellofemoral ligament was incised. Inspection of the knee demonstrated severe degenerative changes with full-thickness loss of articular cartilage. Osteophytes were debrided using a rongeur. Anterior and posterior cruciate ligaments were excised. Two 4.0 mm Schanz pins were inserted in the femur and into the tibia for attachment of the array of trackers used for computer-assisted navigation. Hip center was identified using a circumduction technique. Distal landmarks were mapped using the computer. The distal femur and proximal tibia were mapped using the computer. The distal femoral cutting guide was positioned using computer-assisted navigation so as to achieve a 5 distal valgus cut. The femur was sized and it was felt that a size 8 femoral component was appropriate. A size 8 femoral cutting guide was positioned and the anterior cut was performed and verified using the computer. This was followed by completion of the posterior and chamfer cuts. Femoral cutting guide for the central box was then positioned in the center box cut was performed.  Attention was then directed to the proximal tibia. Medial and lateral menisci were excised. The extramedullary tibial cutting guide was positioned using computer-assisted navigation so as to achieve a 0 varus-valgus alignment and 3 posterior slope. The cut was performed and  verified using the computer. The proximal tibia was sized  and it was felt that a size 7 tibial tray was appropriate. Tibial and femoral trials were inserted followed by insertion of a 5 mm polyethylene insert. This allowed for excellent mediolateral soft tissue balancing both in flexion and in full extension. Finally, the patella was cut and prepared so as to accommodate a 41 mm medialized dome patella. A patella trial was placed and the knee was placed through a range of motion with excellent patellar tracking appreciated. The femoral trial was removed after debridement of posterior osteophytes. The central post-hole for the tibial component was reamed followed by insertion of a keel punch. Tibial trials were then removed. Cut surfaces of bone were irrigated with copious amounts of normal saline with antibiotic solution using pulsatile lavage and then suctioned dry. Polymethylmethacrylate cement with gentamicin was prepared in the usual fashion using a vacuum mixer. Cement was applied to the cut surface of the proximal tibia as well as along the undersurface of a size 7 rotating platform tibial component. Tibial component was positioned and impacted into place. Excess cement was removed using [employer] fast streamer. Cement was then applied to the cut surfaces of the femur as well as along the posterior flanges of the size 8 femoral component. The femoral component was positioned and impacted into place. Excess cement was removed using [employer] fast streamer. A 5 mm polyethylene trial was inserted and the knee was brought into full extension with steady axial compression applied. Finally, cement was applied to the backside of a 41 mm medialized dome patella and the patellar component was positioned and patellar clamp applied. Excess cement was removed using [employer] fast streamer. After adequate curing of the cement, the tourniquet was deflated after a total tourniquet time of 87 minutes. Hemostasis was achieved using electrocautery.  The knee was irrigated with copious amounts of normal saline with antibiotic solution using pulsatile lavage and then suctioned dry. 20 mL of 1.3% Exparel and 60 mL of 0.25% Marcaine in 40 mL of normal saline was injected along the posterior capsule, medial and lateral gutters, and along the arthrotomy site. A 5 mm stabilized rotating platform polyethylene insert was inserted and the knee was placed through a range of motion with excellent mediolateral soft tissue balancing appreciated and excellent patellar tracking noted. 2 medium drains were placed in the wound bed and brought out through separate stab incisions. The medial parapatellar portion of the incision was reapproximated using interrupted sutures of #1 Vicryl. Subcutaneous tissue was approximated in layers using first #0 Vicryl followed #2-0 Vicryl. The skin was approximated with skin staples. A sterile dressing was applied.  The patient tolerated the procedure well and was transported to the recovery room in stable condition.    Helyne Genther P. Holley Bouche., M.D.

## 2018-03-15 NOTE — Progress Notes (Signed)
IS education complete. Pt understands technique and reason for use. Pt inspire 2220ml+. Pt independent with use.

## 2018-03-15 NOTE — Transfer of Care (Signed)
Immediate Anesthesia Transfer of Care Note  Patient: Michele Cruz  Procedure(s) Performed: COMPUTER ASSISTED TOTAL KNEE ARTHROPLASTY (Left Knee)  Patient Location: PACU  Anesthesia Type:Spinal  Level of Consciousness: awake and alert   Airway & Oxygen Therapy: Patient Spontanous Breathing  Post-op Assessment: Report given to RN and Post -op Vital signs reviewed and stable  Post vital signs: Reviewed  Last Vitals:  Vitals Value Taken Time  BP 114/61 03/15/2018  2:34 PM  Temp 36.8 C 03/15/2018  2:34 PM  Pulse 88 03/15/2018  2:35 PM  Resp 12 03/15/2018  2:35 PM  SpO2 97 % 03/15/2018  2:35 PM  Vitals shown include unvalidated device data.  Last Pain:  Vitals:   03/15/18 0954  TempSrc: Tympanic  PainSc: 0-No pain         Complications: No apparent anesthesia complications

## 2018-03-15 NOTE — Progress Notes (Signed)
Dr. Andree Elk was called and notified that spinal is still at L2 on Left and L5 on the right so he stated that the pt can go to her inpatient room 156.

## 2018-03-15 NOTE — Anesthesia Procedure Notes (Signed)
Spinal  Patient location during procedure: OR Staffing Anesthesiologist: Alvin Critchley, MD Resident/CRNA: Rolla Plate, CRNA Performed: resident/CRNA  Preanesthetic Checklist Completed: patient identified, site marked, surgical consent, pre-op evaluation, timeout performed, IV checked, risks and benefits discussed and monitors and equipment checked Spinal Block Patient position: sitting Prep: ChloraPrep and site prepped and draped Patient monitoring: heart rate, continuous pulse ox, blood pressure and cardiac monitor Approach: midline Location: L4-5 Injection technique: single-shot Needle Needle type: Quincke  Needle gauge: 22 G Needle length: 9 cm Additional Notes Negative paresthesia. Negative blood return. Positive free-flowing CSF. Expiration date of kit checked and confirmed. Patient tolerated procedure well, without complications.

## 2018-03-15 NOTE — Progress Notes (Signed)
Pt arrived to room 156 from PACU. Pt alert and oriented X4. Husband at bedside. Pt denies pain at this time. NS infusing. Pt on room air. Polar care on and running. Bone foam placed under left foot. Foley and hemovac intact and draining. Phone and call bell within reach. No complaints from pt at this time.

## 2018-03-15 NOTE — Anesthesia Post-op Follow-up Note (Signed)
Anesthesia QCDR form completed.        

## 2018-03-16 ENCOUNTER — Encounter: Payer: Self-pay | Admitting: Orthopedic Surgery

## 2018-03-16 NOTE — NC FL2 (Signed)
[place] LEVEL OF CARE SCREENING TOOL     IDENTIFICATION  Patient Name: Michele Cruz Birthdate: 1955-12-24 Sex: female Admission Date (Current Location): 03/15/2018  [place] and [place] Number:  Engineering geologist and Address:  [facility], [address] [place],  [address]      Provider Number: [id number]  Attending Physician Name and Address:  Dereck Leep, MD  Relative Name and Phone Number:       Current Level of Care: Hospital Recommended Level of Care: [place] Prior Approval Number:    Date Approved/Denied:   PASRR Number: [id number])  Discharge Plan: SNF    Current Diagnoses: Patient Active Problem List   Diagnosis Date Noted  . S/P total knee arthroplasty 03/15/2018  . Primary osteoarthritis of both knees 08/09/2017  . Internal hemorrhoids 07/21/2017  . Hydradenitis 07/21/2017  . Obesity (BMI 30-39.9) 12/09/2016  . Encounter for routine gynecological examination 01/10/2014  . Osteoarthritis of knee 06/21/2012  . Screening mammogram, encounter for 04/07/2011  . Routine general medical examination at a health care facility 03/25/2011  . HYPERCHOLESTEROLEMIA 12/11/2009  . COLONIC POLYPS, HX OF 08/03/2009  . HERNIATED CERVICAL DISC 06/20/2009  . Herniated cervical disc 06/20/2009  . BACK PAIN, LUMBAR, WITH RADICULOPATHY 10/13/2008  . Generalized anxiety disorder 05/24/2007  . ALLERGIC RHINITIS 05/24/2007  . ROSACEA 05/24/2007  . CARDIAC MURMUR 05/24/2007    Orientation RESPIRATION BLADDER Height & Weight     Self, Time, Situation, Place  Normal Continent Weight: 210 lb 1.6 oz (95.3 kg) Height:  5\' 6"  (167.6 cm)  BEHAVIORAL SYMPTOMS/MOOD NEUROLOGICAL BOWEL NUTRITION STATUS      Continent Diet(Diet: Regular )  AMBULATORY STATUS COMMUNICATION OF NEEDS Skin   Extensive Assist Verbally Surgical wounds(Incision: Left Knee. )                       Personal Care Assistance Level  of Assistance  Bathing, Feeding, Dressing Bathing Assistance: Limited assistance Feeding assistance: Independent Dressing Assistance: Limited assistance     Functional Limitations Info  Sight, Hearing, Speech Sight Info: Adequate Hearing Info: Adequate Speech Info: Adequate    SPECIAL CARE FACTORS FREQUENCY  PT (By licensed PT), OT (By licensed OT)     PT Frequency: (5) OT Frequency: (5)            Contractures      Additional Factors Info  Code Status, Allergies, Isolation Precautions Code Status Info: (Full Code. ) Allergies Info: (Decadron Dexamethasone, Lipitor Atorvastatin Calcium, Depo medrol Methylprednisolone Acetate, Meloxicam, Simvastatin)     Isolation Precautions Info: (MRSA Nasal Swab. )     Current Medications (03/16/2018):  This is the current hospital active medication list Current Facility-Administered Medications  Medication Dose Route Frequency Provider Last Rate Last Dose  . 0.9 %  sodium chloride infusion   Intravenous Continuous Hooten, Laurice Record, MD 100 mL/hr at 03/16/18 0504    . acetaminophen (OFIRMEV) IV 1,000 mg  1,000 mg Intravenous Q6H Hooten, Laurice Record, MD 400 mL/hr at 03/16/18 0554 1,000 mg at 03/16/18 0554  . acetaminophen (TYLENOL) tablet 325-650 mg  325-650 mg Oral Q6H PRN Hooten, Laurice Record, MD      . acidophilus (RISAQUAD) capsule 1 capsule  1 capsule Oral Daily Hooten, Laurice Record, MD      . alum & mag hydroxide-simeth (MAALOX/MYLANTA) 200-200-20 MG/5ML suspension 30 mL  30 mL Oral Q4H PRN Hooten, Laurice Record, MD      . bisacodyl (  DULCOLAX) suppository 10 mg  10 mg Rectal Daily PRN Hooten, Laurice Record, MD      . ceFAZolin (ANCEF) IVPB 2g/100 mL premix  2 g Intravenous Q6H Hooten, Laurice Record, MD 200 mL/hr at 03/16/18 0507 2 g at 03/16/18 0507  . celecoxib (CELEBREX) capsule 200 mg  200 mg Oral BID Dereck Leep, MD   200 mg at 03/15/18 2118  . diphenhydrAMINE (BENADRYL) 12.5 MG/5ML elixir 12.5-25 mg  12.5-25 mg Oral Q4H PRN Hooten, Laurice Record, MD      .  enoxaparin (LOVENOX) injection 30 mg  30 mg Subcutaneous Q12H Hooten, Laurice Record, MD      . ferrous sulfate tablet 325 mg  325 mg Oral BID WC Hooten, Laurice Record, MD   325 mg at 03/15/18 1736  . [START ON 03/19/2018] fluconazole (DIFLUCAN) tablet 200 mg  200 mg Oral Q Fri Hooten, Laurice Record, MD      . gabapentin (NEURONTIN) capsule 300 mg  300 mg Oral QHS Hooten, Laurice Record, MD   300 mg at 03/15/18 2118  . HYDROmorphone (DILAUDID) injection 0.5-1 mg  0.5-1 mg Intravenous Q4H PRN Hooten, Laurice Record, MD      . Ivermectin 1 % CREA 1 application  1 application Topical Daily Hooten, Laurice Record, MD      . loratadine (CLARITIN) tablet 10 mg  10 mg Oral Daily Hooten, Laurice Record, MD      . magnesium hydroxide (MILK OF MAGNESIA) suspension 30 mL  30 mL Oral Daily Hooten, Laurice Record, MD   30 mL at 03/15/18 1735  . magnesium oxide (MAG-OX) tablet 400 mg  400 mg Oral Daily Hooten, Laurice Record, MD   400 mg at 03/15/18 1736  . menthol-cetylpyridinium (CEPACOL) lozenge 3 mg  1 lozenge Oral PRN Hooten, Laurice Record, MD       Or  . phenol (CHLORASEPTIC) mouth spray 1 spray  1 spray Mouth/Throat PRN Hooten, Laurice Record, MD      . metoCLOPramide (REGLAN) tablet 5-10 mg  5-10 mg Oral Q8H PRN Hooten, Laurice Record, MD       Or  . metoCLOPramide (REGLAN) injection 5-10 mg  5-10 mg Intravenous Q8H PRN Hooten, Laurice Record, MD      . metoCLOPramide (REGLAN) tablet 10 mg  10 mg Oral TID AC & HS Hooten, Laurice Record, MD   10 mg at 03/15/18 2200  . ondansetron (ZOFRAN) tablet 4 mg  4 mg Oral Q6H PRN Hooten, Laurice Record, MD       Or  . ondansetron (ZOFRAN) injection 4 mg  4 mg Intravenous Q6H PRN Hooten, Laurice Record, MD      . oxyCODONE (Oxy IR/ROXICODONE) immediate release tablet 10 mg  10 mg Oral Q4H PRN Hooten, Laurice Record, MD      . oxyCODONE (Oxy IR/ROXICODONE) immediate release tablet 5 mg  5 mg Oral Q4H PRN Hooten, Laurice Record, MD   5 mg at 03/16/18 0520  . pantoprazole (PROTONIX) EC tablet 40 mg  40 mg Oral BID Dereck Leep, MD   40 mg at 03/15/18 2118  . polyvinyl alcohol  (LIQUIFILM TEARS) 1.4 % ophthalmic solution 1 drop  1 drop Both Eyes BID PRN Hooten, Laurice Record, MD      . rosuvastatin (CRESTOR) tablet 5 mg  5 mg Oral Daily Hooten, Laurice Record, MD   5 mg at 03/15/18 1736  . senna-docusate (Senokot-S) tablet 1 tablet  1 tablet Oral BID Hooten, Laurice Record, MD   1  tablet at 03/15/18 2118  . sodium phosphate (FLEET) 7-19 GM/118ML enema 1 enema  1 enema Rectal Once PRN Hooten, Laurice Record, MD      . traMADol Veatrice Bourbon) tablet 50-100 mg  50-100 mg Oral Q4H PRN Hooten, Laurice Record, MD         Discharge Medications: Please see discharge summary for a list of discharge medications.  Relevant Imaging Results:  Relevant Lab Results:   Additional Information (SSN: [id number])  Yomara Toothman, Veronia Beets, LCSW

## 2018-03-16 NOTE — Progress Notes (Signed)
Physical Therapy Treatment Patient Details Name: Michele Cruz MRN: [id number] DOB: 11/24/1955 Today's Date: 03/16/2018    History of Present Illness Patient is a 62 y/o female post L TKA 03/15/18. Patient is planning to d/c home post stay with husband. Husband is avid with exercise and has attended Joint Class pre-op for d/c preparation.     PT Comments    Pt agreeable to PT; reports increased pain this afternoon, nursing notified of need for pain medication. Current pain 5/10. Pt educated in importance and technique for stretching in flexion and extension and how to perform at home. Educated on importance in regards to function. Pt progressing ambulation distance; improved technique post education on equal step lengths; stressed importance of improved L QS with stance phase. Pt able to demonstrate improved ability. Verbally educated on stairs (use of 1 crutch versus 2 or rw walker use), as pt does not have rails for 3 STE. Plan to perform steps tomorrow. Continue PT to progress all range, strength and safety to improve all functional mobility.    Follow Up Recommendations  Outpatient PT     Equipment Recommendations       Recommendations for Other Services       Precautions / Restrictions Precautions Precautions: Fall Restrictions Weight Bearing Restrictions: Yes LLE Weight Bearing: Weight bearing as tolerated    Mobility  Bed Mobility               General bed mobility comments: Not tested; staying in chair   Transfers Overall transfer level: Needs assistance Equipment used: Rolling walker (2 wheeled) Transfers: Sit to/from Stand Sit to Stand: Min guard         General transfer comment: Slow to rise/sit; good use of hands  Ambulation/Gait Ambulation/Gait assistance: Min guard Gait Distance (Feet): 190 Feet Assistive device: Rolling walker (2 wheeled) Gait Pattern/deviations: Step-through pattern;Step-to pattern;Decreased step length - right;Decreased stance  time - left;Decreased stride length;Decreased dorsiflexion - left;Antalgic   Gait velocity interpretation: <1.8 ft/sec, indicate of risk for recurrent falls General Gait Details: initially partial step through improved to step through with instruction and smaller, equal step lengths.    Stairs Stairs: (verbally educated/plan to perform tomorrow)           Wheelchair Mobility    Modified Rankin (Stroke Patients Only)       Balance Overall balance assessment: Needs assistance Sitting-balance support: Feet supported Sitting balance-Leahy Scale: Good     Standing balance support: Bilateral upper extremity supported Standing balance-Leahy Scale: Fair                              Cognition Arousal/Alertness: Awake/alert Behavior During Therapy: WFL for tasks assessed/performed Overall Cognitive Status: Within Functional Limits for tasks assessed                                        Exercises Total Joint Exercises Quad Sets: Strengthening;Both;20 reps(also in stand and with gait) Long Arc Quad: AROM;Strengthening;Left;10 reps;Seated Knee Flexion: AROM;Left;10 reps;Seated(3 positions each rep with 10 sec hold each) Other Exercises Other Exercises: repetitions/frequency/duration of stretching exercises Other Exercises: open/closed position of knee Other Exercises: conservative progression of stretching as well as importance for range as it applies to function Other Exercises: supervision bathroom use    General Comments        Pertinent Vitals/Pain Pain Assessment: 0-10  Pain Score: 5 (with weight bearing) Pain Location: L knee Pain Descriptors / Indicators: Dull Pain Intervention(s): Monitored during session    Home Living Family/patient expects to be discharged to:: Private residence Living Arrangements: Spouse/significant other Available Help at Discharge: Family Type of Home: House Home Access: Stairs to enter Entrance  Stairs-Rails: None Home Layout: One level Home Equipment: None      Prior Function Level of Independence: Independent          PT Goals (current goals can now be found in the care plan section) Acute Rehab PT Goals Patient Stated Goal: Be independent again Progress towards PT goals: Progressing toward goals    Frequency    BID      PT Plan Current plan remains appropriate    Co-evaluation              AM-PAC PT "6 Clicks" Daily Activity  Outcome Measure  Difficulty turning over in bed (including adjusting bedclothes, sheets and blankets)?: A Little Difficulty moving from lying on back to sitting on the side of the bed? : A Little Difficulty sitting down on and standing up from a chair with arms (e.g., wheelchair, bedside commode, etc,.)?: A Little Help needed moving to and from a bed to chair (including a wheelchair)?: A Little Help needed walking in hospital room?: A Little Help needed climbing 3-5 steps with a railing? : A Little 6 Click Score: 18    End of Session Equipment Utilized During Treatment: Gait belt Activity Tolerance: Patient tolerated treatment well Patient left: in chair;with chair alarm set;with call bell/phone within reach;with SCD's reapplied;Other (comment)(polar care in place) Nurse Communication: Patient requests pain meds PT Visit Diagnosis: Unsteadiness on feet (R26.81);Ataxic gait (R26.0);Other abnormalities of gait and mobility (R26.89);Muscle weakness (generalized) (M62.81)     Time: 1415-1500 PT Time Calculation (min) (ACUTE ONLY): 45 min  Charges:  $Gait Training: 8-22 mins $Therapeutic Exercise: 8-22 mins $Therapeutic Activity: 8-22 mins                      Larae Grooms, PTA 03/16/2018, 4:44 PM

## 2018-03-16 NOTE — Evaluation (Addendum)
Physical Therapy Evaluation Patient Details Name: Michele Cruz MRN: [id number] DOB: 1955/07/02 Today's Date: 03/16/2018   History of Present Illness  Patient is a 62 y/o female post L TKA 03/15/18. Patient is planning to d/c home post stay with husband. Husband is avid with exercise and has attended Joint Class pre-op for d/c preparation.   Clinical Impression  Patient is awake and oriented x4 and is able to follow commands with good safety awareness throughout session. Patient is able to complete bed mobility modI with increased time needed, STS transfer with CGA for safety and min cuing for proper safety precautions with good carry over, and CGA for safety with amb over 178ft with good carry over with cuing for normalized gait with UE use to decrease LLE WB as needed and to "keep head up". Patient will benefit from skilled PT to ensure safety with home navigation and to return to PLOF    Follow Up Recommendations Outpatient PT    Equipment Recommendations  Standard walker    Recommendations for Other Services       Precautions / Restrictions Restrictions Weight Bearing Restrictions: Yes LLE Weight Bearing: Weight bearing as tolerated      Mobility  Bed Mobility Overal bed mobility: Independent             General bed mobility comments: Patient is able to scoot in bed, complete supine<>sit transfer ind  Transfers Overall transfer level: Needs assistance Equipment used: Rolling walker (2 wheeled) Transfers: Sit to/from Stand Sit to Stand: Min guard         General transfer comment: Patient with good safety precautions with STS transfer with RW utilizing 1UE at bed and 1UE at RW  Ambulation/Gait Ambulation/Gait assistance: Min guard Gait Distance (Feet): 100 Feet Assistive device: Rolling walker (2 wheeled) Gait Pattern/deviations: Decreased stance time - left;Decreased step length - right;Decreased weight shift to right Gait velocity: 0.74ft/sec Gait velocity  interpretation: <1.31 ft/sec, indicative of household ambulator General Gait Details: Patient able to demonstrate reciprocol gait with decreased step length by the end of gait trial. Patient is able to correct gait with cuing to maintain "head up" with ambulation and utilize UE to off-load LLE as needed  Stairs            Wheelchair Mobility    Modified Rankin (Stroke Patients Only)       Balance Overall balance assessment: Mild deficits observed, not formally tested                                           Pertinent Vitals/Pain Pain Assessment: 0-10 Pain Score: 3  Pain Descriptors / Indicators: Dull Pain Intervention(s): Premedicated before session;Ice applied    Home Living Family/patient expects to be discharged to:: Private residence Living Arrangements: Spouse/significant other Available Help at Discharge: Family Type of Home: House Home Access: Stairs to enter Entrance Stairs-Rails: None Technical brewer of Steps: 3 Home Layout: One level Home Equipment: None      Prior Function Level of Independence: Independent               Hand Dominance   Dominant Hand: Right    Extremity/Trunk Assessment   Upper Extremity Assessment Upper Extremity Assessment: Overall WFL for tasks assessed    Lower Extremity Assessment Lower Extremity Assessment: Overall WFL for tasks assessed       Communication   Communication: No difficulties  Cognition Arousal/Alertness: Awake/alert Behavior During Therapy: WFL for tasks assessed/performed Overall Cognitive Status: Within Functional Limits for tasks assessed                                        General Comments      Exercises Total Joint Exercises Goniometric ROM: Knee ext = 8 degrees from neutral; Knee flex = 98 degrees Other Exercises Other Exercises: L SLR x10 with minA for eccentric control with lowering for last 3 reps; good quat activation noted Other  Exercises: L Heel slides x10 with increasing flexion as able each rep Other Exercises: L Ankle Pumps x10 with patient demonstrating good carry over from previous education Other Exercises: Toilet transfer; amb with RW CGA for safety, STS with RW CGA for safety; standing and sitting balance following transfers   Assessment/Plan    PT Assessment Patient needs continued PT services  PT Problem List Decreased strength;Decreased mobility;Decreased safety awareness;Decreased range of motion;Decreased activity tolerance;Decreased knowledge of precautions;Decreased balance;Pain       PT Treatment Interventions Gait training;Therapeutic exercise;Patient/family education;Therapeutic activities;Modalities;DME instruction;Stair training;Balance training;Functional mobility training;Neuromuscular re-education;Manual techniques    PT Goals (Current goals can be found in the Care Plan section)  Acute Rehab PT Goals Patient Stated Goal: Be able to ambulate community distance ind with least restrictive AD PT Goal Formulation: With patient Time For Goal Achievement: 04/27/18 Potential to Achieve Goals: Good    Frequency BID   Barriers to discharge        Co-evaluation               AM-PAC PT "6 Clicks" Daily Activity  Outcome Measure Difficulty turning over in bed (including adjusting bedclothes, sheets and blankets)?: A Little Difficulty moving from lying on back to sitting on the side of the bed? : A Little Difficulty sitting down on and standing up from a chair with arms (e.g., wheelchair, bedside commode, etc,.)?: Unable Help needed moving to and from a bed to chair (including a wheelchair)?: A Little Help needed walking in hospital room?: A Little Help needed climbing 3-5 steps with a railing? : A Lot 6 Click Score: 15    End of Session Equipment Utilized During Treatment: Gait belt Activity Tolerance: Patient tolerated treatment well Patient left: in chair;with chair alarm set;with  call bell/phone within reach;with SCD's reapplied Nurse Communication: Other (comment) PT Visit Diagnosis: Unsteadiness on feet (R26.81);Ataxic gait (R26.0);Other abnormalities of gait and mobility (R26.89);Muscle weakness (generalized) (M62.81)    Time: 0962-8366 PT Time Calculation (min) (ACUTE ONLY): 32 min   Charges:   PT Evaluation $PT Eval Low Complexity: 1 Low PT Treatments $Gait Training: 8-22 mins $Therapeutic Exercise: 8-22 mins        Shelton Silvas PT, DPT  Shelton Silvas 03/16/2018, 11:13 AM

## 2018-03-16 NOTE — Evaluation (Signed)
Occupational Therapy Evaluation Patient Details Name: Michele Cruz MRN: [id number] DOB: May 07, 1956 Today's Date: 03/16/2018    History of Present Illness Patient is a 62 y/o female post L TKA 03/15/18. Patient is planning to d/c home post stay with husband. Husband is avid with exercise and has attended Joint Class pre-op for d/c preparation.    Clinical Impression   Pt is 62 year old female s/p L TKA on 03-15-18.  Pt was independent in all ADLs prior to surgery and is eager to return to PLOF. She lives at home with her husband who will be at work for half days so she will be home alone for about 4 hours each day. Pt currently requires min assist for LB dressing while in seated position due to pain and limited AROM of L knee.  Pt would benefit from instruction in dressing techniques with or without assistive devices for dressing and bathing skills.  Pt would also benefit from recommendations for home modifications to increase safety in the bathroom and prevent falls. No further OT recommended after discharge.      Follow Up Recommendations  No OT follow up    Equipment Recommendations  Other (comment)(rec reacher at home since she will be alone for several hours)    Recommendations for Other Services       Precautions / Restrictions Precautions Precautions: Fall Restrictions Weight Bearing Restrictions: Yes LLE Weight Bearing: Weight bearing as tolerated      Mobility Bed Mobility Overal bed mobility: Independent             General bed mobility comments: Patient is able to scoot in bed, complete supine<>sit transfer ind  Transfers Overall transfer level: Needs assistance Equipment used: Rolling walker (2 wheeled) Transfers: Sit to/from Stand Sit to Stand: Min guard         General transfer comment: Patient with good safety precautions with STS transfer with RW utilizing 1UE at bed and 1UE at RW    Balance Overall balance assessment: Mild deficits observed, not  formally tested                                         ADL either performed or assessed with clinical judgement   ADL Overall ADL's : Needs assistance/impaired Eating/Feeding: Independent;Set up   Grooming: Wash/dry hands;Wash/dry face;Oral care;Set up;Independent           Upper Body Dressing : Independent;Set up   Lower Body Dressing: Minimal assistance;Set up;With adaptive equipment Lower Body Dressing Details (indicate cue type and reason): Pt did well with follow through of tech using reacher and sock aid while seated in recliner               General ADL Comments: CGA for ambulation with FWW     Vision Patient Visual Report: No change from baseline       Perception     Praxis      Pertinent Vitals/Pain Pain Assessment: 0-10 Pain Score: 3  Pain Location: L knee Pain Descriptors / Indicators: Dull Pain Intervention(s): Premedicated before session;Monitored during session     Hand Dominance Right   Extremity/Trunk Assessment Upper Extremity Assessment Upper Extremity Assessment: Overall WFL for tasks assessed   Lower Extremity Assessment Lower Extremity Assessment: Defer to PT evaluation       Communication Communication Communication: No difficulties   Cognition Arousal/Alertness: Awake/alert Behavior During Therapy: [facility] for  tasks assessed/performed Overall Cognitive Status: Within Functional Limits for tasks assessed                                     General Comments       Exercises Total Joint Exercises Goniometric ROM: Knee ext = 8 degrees from neutral; Knee flex = 98 degrees Other Exercises Other Exercises: L SLR x10 with minA for eccentric control with lowering for last 3 reps; good quat activation noted Other Exercises: L Heel slides x10 with increasing flexion as able each rep Other Exercises: L Ankle Pumps x10 with patient demonstrating good carry over from previous education Other Exercises:  Toilet transfer; amb with RW CGA for safety, STS with RW CGA for safety; standing and sitting balance following transfers   Shoulder Instructions      Home Living Family/patient expects to be discharged to:: Private residence Living Arrangements: Spouse/significant other Available Help at Discharge: Family Type of Home: House Home Access: Stairs to enter Technical brewer of Steps: 3 Entrance Stairs-Rails: None Home Layout: One level     Bathroom Shower/Tub: Teacher, early years/pre: Standard Bathroom Accessibility: No   Home Equipment: None          Prior Functioning/Environment Level of Independence: Independent                 OT Problem List: Pain;Decreased activity tolerance;Decreased knowledge of use of DME or AE;Decreased strength;Decreased range of motion      OT Treatment/Interventions: Self-care/ADL training;Patient/family education;DME and/or AE instruction    OT Goals(Current goals can be found in the care plan section) Acute Rehab OT Goals Patient Stated Goal: Be independent again OT Goal Formulation: With patient Time For Goal Achievement: 03/30/18 Potential to Achieve Goals: Good ADL Goals Pt Will Perform Lower Body Dressing: with set-up;with supervision;sit to/from stand;with adaptive equipment Pt Will Transfer to Toilet: with set-up;with supervision;stand pivot transfer;regular height toilet;grab bars  OT Frequency: Min 1X/week   Barriers to D/C:            Co-evaluation              AM-PAC PT "6 Clicks" Daily Activity     Outcome Measure Help from another person eating meals?: None Help from another person taking care of personal grooming?: None Help from another person toileting, which includes using toliet, bedpan, or urinal?: A Little Help from another person bathing (including washing, rinsing, drying)?: A Little Help from another person to put on and taking off regular upper body clothing?: None Help from  another person to put on and taking off regular lower body clothing?: A Little 6 Click Score: 21   End of Session Equipment Utilized During Treatment: Gait belt  Activity Tolerance: Patient tolerated treatment well Patient left: in chair;with call bell/phone within reach;with chair alarm set  OT Visit Diagnosis: Pain;Muscle weakness (generalized) (M62.81) Pain - Right/Left: Left Pain - part of body: Knee                Time: 1405-1430 OT Time Calculation (min): 25 min Charges:  OT General Charges $OT Visit: 1 Visit OT Evaluation $OT Eval Low Complexity: 1 Low OT Treatments $Self Care/Home Management : 8-22 mins  Chrys Racer, OTR/L ascom [phone] 03/16/18, 2:44 PM

## 2018-03-16 NOTE — Progress Notes (Signed)
   Subjective: 1 Day Post-Op Procedure(s) (LRB): COMPUTER ASSISTED TOTAL KNEE ARTHROPLASTY (Left) Patient reports pain as mild.   Patient is well, and has had no acute complaints or problems We will start therapy today.  Plan is to go Home after hospital stay. no nausea and no vomiting Patient denies any chest pains or shortness of breath. Patient resting very well.  Sitting up eating breakfast.  Voicing no complaints.   Objective: Vital signs in last 24 hours: Temp:  [97.6 F (36.4 C)-98.7 F (37.1 C)] 98.4 F (36.9 C) (09/24 0450) Pulse Rate:  [72-96] 76 (09/24 0450) Resp:  [14-18] 16 (09/24 0450) BP: (102-156)/(61-83) 110/68 (09/24 0450) SpO2:  [96 %-100 %] 98 % (09/24 0450) Weight:  [95.3 kg] 95.3 kg (09/23 1710) Heels are non tender and elevated off the bed using rolled towels along with bone foam under operative heel  Intake/Output from previous day: 09/23 0701 - 09/24 0700 In: 3421.5 [P.O.:240; I.V.:2542.9; IV Piggyback:638.7] Out: 4005 [Urine:3775; Drains:180; Blood:50] Intake/Output this shift: No intake/output data recorded.  No results for input(s): HGB in the last 72 hours. No results for input(s): WBC, RBC, HCT, PLT in the last 72 hours. No results for input(s): NA, K, CL, CO2, BUN, CREATININE, GLUCOSE, CALCIUM in the last 72 hours. No results for input(s): LABPT, INR in the last 72 hours.  EXAM General - Patient is Alert, Appropriate and Oriented Extremity - Neurologically intact Neurovascular intact Sensation intact distally Intact pulses distally Dorsiflexion/Plantar flexion intact Compartment soft Dressing - dressing C/D/I Motor Function - intact, moving foot and toes well on exam.  Not able to do straight leg raise on her own yet.  Past Medical History:  Diagnosis Date  . Allergic rhinitis   . Anemia, iron deficiency   . Anxiety   . Arthritis   . Asthma    no symptoms last 25 years  . Colon polyps   . Family history of adverse reaction to  anesthesia    sister PONV  . Heavy menses   . Hemorrhoids   . Hidradenitis suppurativa   . History of exercise stress test 06/1999   neg  . History of MRI 2003   neg per patient  . History of MRI 2007   right quad fatty defect  . History of MRI of spine 03/2004   C-5, C5-6 disk protrusion  . Hyperlipidemia    LDL  . Rosacea   . Spondylosis    deg lumbar disc dz  . Wisdom teeth removed   . Zoster     Assessment/Plan: 1 Day Post-Op Procedure(s) (LRB): COMPUTER ASSISTED TOTAL KNEE ARTHROPLASTY (Left) Active Problems:   S/P total knee arthroplasty  Estimated body mass index is 33.91 kg/m as calculated from the following:   Height as of this encounter: 5\' 6"  (1.676 m).   Weight as of this encounter: 95.3 kg. Advance diet Up with therapy D/C IV fluids Plan for discharge tomorrow Discharge home with home health  Labs: None DVT Prophylaxis - Lovenox, Foot Pumps and TED hose Weight-Bearing as tolerated to left leg D/C O2 and Pulse OX and try on Room Air Begin working on bowel movement  Michele Cruz R. [place] 03/16/2018, 8:07 AM

## 2018-03-16 NOTE — Anesthesia Postprocedure Evaluation (Signed)
Anesthesia Post Note  Patient: Michele Cruz  Procedure(s) Performed: COMPUTER ASSISTED TOTAL KNEE ARTHROPLASTY (Left Knee)  Patient location during evaluation: Nursing Unit Anesthesia Type: Spinal Level of consciousness: awake, awake and alert, oriented and patient cooperative Pain management: pain level controlled Vital Signs Assessment: post-procedure vital signs reviewed and stable Respiratory status: spontaneous breathing and nonlabored ventilation Cardiovascular status: stable Postop Assessment: no headache, no backache, patient able to bend at knees, no apparent nausea or vomiting and adequate PO intake Anesthetic complications: no     Last Vitals:  Vitals:   03/15/18 2331 03/16/18 0450  BP: 105/62 110/68  Pulse: 72 76  Resp: 17 16  Temp: 36.6 C 36.9 C  SpO2: 97% 98%    Last Pain:  Vitals:   03/16/18 0604  TempSrc:   PainSc: Asleep                 Cherry [place],  [facility]

## 2018-03-16 NOTE — Progress Notes (Signed)
Clinical Social Worker (CSW) received SNF consult. PT is recommending outpatient PT. RN case manager aware of above. Please reconsult if future social work needs arise. CSW signing off.   Marria Mathison, LCSW [phone]  

## 2018-03-17 MED ORDER — ENOXAPARIN SODIUM 40 MG/0.4ML ~~LOC~~ SOLN: 40.0000 mg | SUBCUTANEOUS | 0 refills | Status: DC

## 2018-03-17 MED ORDER — TRAMADOL HCL 50 MG PO TABS: 50.0000 mg | ORAL_TABLET | ORAL | 0 refills | Status: DC | PRN

## 2018-03-17 MED ORDER — OXYCODONE HCL 5 MG PO TABS: 5.0000 mg | ORAL_TABLET | ORAL | 0 refills | Status: DC | PRN

## 2018-03-17 NOTE — Progress Notes (Signed)
   Subjective: 2 Days Post-Op Procedure(s) (LRB): COMPUTER ASSISTED TOTAL KNEE ARTHROPLASTY (Left) Patient reports pain as 7 on 0-10 scale.   Patient is well, and has had no acute complaints or problems Patient did very well physical therapy yesterday.  Was able to ambulate around the nurses desk.  Will still need to do stairs today prior to being discharged Plan is to go Home after hospital stay. no nausea and no vomiting Patient denies any chest pains or shortness of breath. Objective: Vital signs in last 24 hours: Temp:  [97.4 F (36.3 C)-97.8 F (36.6 C)] 97.8 F (36.6 C) (09/24 2314) Pulse Rate:  [67-75] 75 (09/24 2314) Resp:  [18-20] 18 (09/24 2314) BP: (110-127)/(54-76) 110/54 (09/24 2314) SpO2:  [100 %] 100 % (09/24 2314) well approximated incision Heels are non tender and elevated off the bed using rolled towels Intake/Output from previous day: 09/24 0701 - 09/25 0700 In: 1989.3 [P.O.:960; I.V.:968.6; IV Piggyback:60.7] Out: 260 [Drains:260] Intake/Output this shift: No intake/output data recorded.  No results for input(s): HGB in the last 72 hours. No results for input(s): WBC, RBC, HCT, PLT in the last 72 hours. No results for input(s): NA, K, CL, CO2, BUN, CREATININE, GLUCOSE, CALCIUM in the last 72 hours. No results for input(s): LABPT, INR in the last 72 hours.  EXAM General - Patient is Alert, Appropriate and Oriented Extremity - Neurologically intact Neurovascular intact Sensation intact distally Intact pulses distally Dorsiflexion/Plantar flexion intact No cellulitis present Compartment soft Dressing - dressing C/D/I Motor Function - intact, moving foot and toes well on exam.    Past Medical History:  Diagnosis Date  . Allergic rhinitis   . Anemia, iron deficiency   . Anxiety   . Arthritis   . Asthma    no symptoms last 25 years  . Colon polyps   . Family history of adverse reaction to anesthesia    sister PONV  . Heavy menses   . Hemorrhoids    . Hidradenitis suppurativa   . History of exercise stress test 06/1999   neg  . History of MRI 2003   neg per patient  . History of MRI 2007   right quad fatty defect  . History of MRI of spine 03/2004   C-5, C5-6 disk protrusion  . Hyperlipidemia    LDL  . Rosacea   . Spondylosis    deg lumbar disc dz  . Wisdom teeth removed   . Zoster     Assessment/Plan: 2 Days Post-Op Procedure(s) (LRB): COMPUTER ASSISTED TOTAL KNEE ARTHROPLASTY (Left) Active Problems:   S/P total knee arthroplasty  Estimated body mass index is 33.91 kg/m as calculated from the following:   Height as of this encounter: 5\' 6"  (1.676 m).   Weight as of this encounter: 95.3 kg. Up with therapy Discharge home with home health  Labs: None DVT Prophylaxis - Lovenox, Foot Pumps and TED hose Weight-Bearing as tolerated to left leg She needs a bowel movement prior to being discharged Please wash operative leg and apply new dressing and TED stockings to both legs Please give the patient 2 extra honeycomb dressings to take home Bone foam is to go home with patient Hemovac was discontinued today.  Into the drain appeared to be intact.  Jillyn Ledger. Dwight Diggins 03/17/2018, 7:21 AM

## 2018-03-17 NOTE — Care Management Note (Signed)
Case Management Note  Patient Details  Name: Michele Cruz MRN: [id number] Date of Birth: 1956/04/16  Subjective/Objective:  Met with patient at bedside to discuss transitions of care. Patient lives at home with her spouse. She will need a walker. Ordered from [place] [facility]. Checked Lovenox at [facility]. Patient updated. Offered a list of home care agencies. Referral to [facility] for [facility].  Address- [address] [place], [place] [address]                  Action/Plan:   Expected Discharge Date:  03/17/18               Expected Discharge Plan:  [place]  In-House Referral:     Discharge planning Services  CM Consult  Post Acute Care Choice:  Durable Medical Equipment, Home Health Choice offered to:  Patient  DME Arranged:  Walker rolling DME Agency:  [place]:  PT [place] Agency:  [facility]  Status of Service:  Completed, signed off  If discussed at [employer] of Stay Meetings, dates discussed:    Additional Comments:  Jolly Mango, RN 03/17/2018, 11:12 AM

## 2018-03-17 NOTE — Progress Notes (Signed)
Discharge instructions and prescriptions given to pt. IV removed. Pain medication given for left knee pain. Home walker delivered to room. Pt dressed and husband at bedside. Pt will eat lunch and then be discharged home with husband. No other complaints or questions at this time.

## 2018-03-17 NOTE — Discharge Summary (Signed)
Physician Discharge Summary  Patient ID: Michele Cruz MRN: [id number] DOB/AGE: [birth year] 62 y.o.  Admit date: 03/15/2018 Discharge date: 03/17/2018  Admission Diagnoses:  PRIMARY OSTEOARTHRITIS OF LEFT KNEE   Discharge Diagnoses: Patient Active Problem List   Diagnosis Date Noted  . S/P total knee arthroplasty 03/15/2018  . Primary osteoarthritis of both knees 08/09/2017  . Internal hemorrhoids 07/21/2017  . Hydradenitis 07/21/2017  . Obesity (BMI 30-39.9) 12/09/2016  . Encounter for routine gynecological examination 01/10/2014  . Osteoarthritis of knee 06/21/2012  . Screening mammogram, encounter for 04/07/2011  . Routine general medical examination at a health care facility 03/25/2011  . HYPERCHOLESTEROLEMIA 12/11/2009  . COLONIC POLYPS, HX OF 08/03/2009  . HERNIATED CERVICAL DISC 06/20/2009  . Herniated cervical disc 06/20/2009  . BACK PAIN, LUMBAR, WITH RADICULOPATHY 10/13/2008  . Generalized anxiety disorder 05/24/2007  . ALLERGIC RHINITIS 05/24/2007  . ROSACEA 05/24/2007  . CARDIAC MURMUR 05/24/2007    Past Medical History:  Diagnosis Date  . Allergic rhinitis   . Anemia, iron deficiency   . Anxiety   . Arthritis   . Asthma    no symptoms last 25 years  . Colon polyps   . Family history of adverse reaction to anesthesia    sister PONV  . Heavy menses   . Hemorrhoids   . Hidradenitis suppurativa   . History of exercise stress test 06/1999   neg  . History of MRI 2003   neg per patient  . History of MRI 2007   right quad fatty defect  . History of MRI of spine 03/2004   C-5, C5-6 disk protrusion  . Hyperlipidemia    LDL  . Rosacea   . Spondylosis    deg lumbar disc dz  . Wisdom teeth removed   . Zoster      Transfusion: No transfusions during this admission   Consultants (if any):   Discharged Condition: Improved  Hospital Course: Michele Cruz is an 62 y.o. female who was admitted 03/15/2018 with a diagnosis of degenerative arthrosis left  knee and went to the operating room on 03/15/2018 and underwent the above named procedures.    Surgeries:Procedure(s): COMPUTER ASSISTED TOTAL KNEE ARTHROPLASTY on 03/15/2018  PRE-OPERATIVE DIAGNOSIS: Degenerative arthrosis of the left knee, primary  POST-OPERATIVE DIAGNOSIS:  Same  PROCEDURE:  Left total knee arthroplasty using computer-assisted navigation  SURGEON:  Marciano Sequin. M.D.  ASSISTANT:  Vance Peper, PA (present and scrubbed throughout the case, critical for assistance with exposure, retraction, instrumentation, and closure)  ANESTHESIA: spinal  ESTIMATED BLOOD LOSS: 50 mL  FLUIDS REPLACED: 1200 mL of crystalloid  TOURNIQUET TIME: 87 minutes  DRAINS: 2 medium Hemovac drains  SOFT TISSUE RELEASES: Anterior cruciate ligament, posterior cruciate ligament, deep medial collateral ligament, patellofemoral ligament  IMPLANTS UTILIZED: DePuy Attune size 8 posterior stabilized femoral component (cemented), size 7 rotating platform tibial component (cemented), 41 mm medialized dome patella (cemented), and a 5 mm stabilized rotating platform polyethylene insert.  INDICATIONS FOR SURGERY: Michele Cruz is a 62 y.o. year old female with a long history of progressive knee pain. X-rays demonstrated severe degenerative changes in tricompartmental fashion. The patient had not seen any significant improvement despite conservative nonsurgical intervention. After discussion of the risks and benefits of surgical intervention, the patient expressed understanding of the risks benefits and agree with plans for total knee arthroplasty.   The risks, benefits, and alternatives were discussed at length including but not limited to the risks of infection, bleeding, nerve injury, stiffness, blood clots,  the need for revision surgery, cardiopulmonary complications, among others, and they were willing to proceed. Patient tolerated the surgery well. No complications .Patient was taken to  PACU where she was stabilized and then transferred to the orthopedic floor.  Patient started on Lovenox 30 mg q 12 hrs. Foot pumps applied bilaterally at 80 mm hgb. Heels elevated off bed with rolled towels. No evidence of DVT. Calves non tender. Negative Homan. Physical therapy started on day #1 for gait training and transfer with OT starting on  day #1 for ADL and assisted devices. Patient has done well with therapy. Ambulated greater than 200 feet upon being discharged.  Was able to ascend and descend 4 steps safely and independently  Patient's IV And Foley were discontinued on day #1 with Hemovac being discontinued on day #2. Dressing was changed on day 2 prior to patient being discharged   She was given perioperative antibiotics:  Anti-infectives (From admission, onward)   Start     Dose/Rate Route Frequency Ordered Stop   03/19/18 1000  fluconazole (DIFLUCAN) tablet 200 mg     200 mg Oral Every Fri 03/15/18 1707     03/15/18 1730  ceFAZolin (ANCEF) IVPB 2g/100 mL premix     2 g 200 mL/hr over 30 Minutes Intravenous Every 6 hours 03/15/18 1707 03/16/18 1230   03/15/18 0953  ceFAZolin (ANCEF) 2-4 GM/100ML-% IVPB    Note to Pharmacy:  Phineas Real   : cabinet override      03/15/18 0953 03/15/18 2214   03/15/18 0600  ceFAZolin (ANCEF) IVPB 2g/100 mL premix  Status:  Discontinued     2 g 200 mL/hr over 30 Minutes Intravenous On call to O.R. 03/14/18 2200 03/15/18 0957   03/14/18 2215  vancomycin (VANCOCIN) IVPB 1000 mg/200 mL premix  Status:  Discontinued     1,000 mg 200 mL/hr over 60 Minutes Intravenous  Once 03/14/18 2200 03/15/18 1206    .  She was fitted with AV 1 compression foot pump devices, instructed on heel pumps, early ambulation, and fitted with TED stockings bilaterally for DVT prophylaxis.  She benefited maximally from the hospital stay and there were no complications.    Recent vital signs:  Vitals:   03/16/18 1637 03/16/18 2314  BP: 127/76 (!) 110/54  Pulse:  74 75  Resp: 20 18  Temp: 97.8 F (36.6 C) 97.8 F (36.6 C)  SpO2: 100% 100%    Recent laboratory studies:  Lab Results  Component Value Date   HGB 14.2 03/03/2018   HGB 14.6 01/11/2018   HGB 14.6 12/08/2016   Lab Results  Component Value Date   WBC 4.8 03/03/2018   PLT 178 03/03/2018   Lab Results  Component Value Date   INR 0.94 03/03/2018   Lab Results  Component Value Date   NA 141 03/03/2018   K 4.3 03/03/2018   CL 106 03/03/2018   CO2 28 03/03/2018   BUN 22 03/03/2018   CREATININE 0.66 03/03/2018   GLUCOSE 104 (H) 03/03/2018    Discharge Medications:   Allergies as of 03/17/2018      Reactions   Decadron [dexamethasone] Other (See Comments)   Unsure if related---swelling of throat   Lipitor [atorvastatin Calcium] Other (See Comments)   Leg cramps and pain   Depo-medrol [methylprednisolone Acetate]    Rash after injection, a noted common SE from depo-medrol listed in epocrates and most resources   Meloxicam    Pt felt dizzy potentially from this, but does well  on aleve   Simvastatin    Possible transient memory changes, resolved off medicine. Muscle pain      Medication List    STOP taking these medications   Naproxen Sodium 220 MG Caps     TAKE these medications   COLLAGEN PO Take 4 tablets by mouth daily.   enoxaparin 40 MG/0.4ML injection Commonly known as:  LOVENOX Inject 0.4 mLs (40 mg total) into the skin daily for 14 days. Start taking on:  03/18/2018   Fish Oil 1200 MG Caps Take 1,200 mg by mouth daily.   fluconazole 200 MG tablet Commonly known as:  DIFLUCAN Take 200 mg by mouth every Friday.   Fluocinolone Acetonide 0.01 % Oil Place 1 Dose in ear(s) 2 (two) times a week.   loratadine 10 MG tablet Commonly known as:  CLARITIN Take 10 mg by mouth daily.   LUBRICATING EYE DROPS OP Place 1 drop into both eyes 2 (two) times daily as needed (dry eyes).   magnesium oxide 400 MG tablet Commonly known as:  MAG-OX Take 400 mg by  mouth daily.   oxyCODONE 5 MG immediate release tablet Commonly known as:  Oxy IR/ROXICODONE Take 1 tablet (5 mg total) by mouth every 4 (four) hours as needed for moderate pain (pain score 4-6).   Probiotic Caps Take 1 capsule by mouth daily.   rosuvastatin 5 MG tablet Commonly known as:  CRESTOR Take 1 tablet (5 mg total) by mouth daily. What changed:  when to take this   SOOLANTRA 1 % Crea Generic drug:  Ivermectin Apply 1 application topically daily.   traMADol 50 MG tablet Commonly known as:  ULTRAM Take 1-2 tablets (50-100 mg total) by mouth every 4 (four) hours as needed for moderate pain.   Turmeric Curcumin Caps Take 1 capsule by mouth daily.            Durable Medical Equipment  (From admission, onward)         Start     Ordered   03/15/18 1708  DME Walker rolling  Once    Question:  Patient needs a walker to treat with the following condition  Answer:  Total knee replacement status   03/15/18 1707   03/15/18 1708  DME Bedside commode  Once    Question:  Patient needs a bedside commode to treat with the following condition  Answer:  Total knee replacement status   03/15/18 1707          Diagnostic Studies: Dg Knee Left Port  Result Date: 03/15/2018 CLINICAL DATA:  Status post left knee replacement. EXAM: PORTABLE LEFT KNEE - 1-2 VIEW COMPARISON:  Radiographs of January 21, 2017. FINDINGS: The femoral and tibial components appear to be well situated. Surgical drain is seen in the soft tissues anterior to the distal femur. No fracture or dislocation is noted. IMPRESSION: Status post left total knee arthroplasty. Electronically Signed   By: Marijo Conception, M.D.   On: 03/15/2018 14:58    Disposition: Discharge disposition: 01-Home or Self Care       Discharge Instructions    Diet - low sodium heart healthy   Complete by:  As directed    Increase activity slowly   Complete by:  As directed       Follow-up Information    Watt Climes, PA On  03/30/2018.   Specialty:  Physician Assistant Why:  at 9:45am Contact information: [place] [facility] [place] [address] [phone]  Dereck Leep, MD On 04/27/2018.   Specialty:  Orthopedic Surgery Why:  at 10:00am Contact information: [place] [address] [phone]            Signed: Watt Climes. 03/17/2018, 7:26 AM

## 2018-03-17 NOTE — Progress Notes (Signed)
Physical Therapy Treatment Patient Details Name: Tanise Russman MRN: [id number] DOB: [birth year] Today's Date: 03/17/2018    History of Present Illness Patient is a 62 y/o female post L TKA 03/15/18. Patient is planning to d/c home post stay with husband. Husband is avid with exercise and has attended [facility] pre-op for d/c preparation.     PT Comments    Pt agreeable to PT; pain 5/10 L knee. Pt progressed all mobility well increasing ambulation to 350 ft with 1 rest break prior to stair climbing. Negotiated up/cow 4 steps with 1 crutch and 1 person assist, as pt does not have rails at home. Improved L knee range -4 to 90 degrees. Pt spouse educated on all exercises technique, frequency, repetitions and duration, car transfers, and activity level. Also answered/educated pt questions regarding tub bench use.  Pt prepared to discharge home.   Follow Up Recommendations        Equipment Recommendations       Recommendations for Other Services       Precautions / Restrictions Precautions Precautions: Fall Restrictions Weight Bearing Restrictions: Yes LLE Weight Bearing: Weight bearing as tolerated    Mobility  Bed Mobility Overal bed mobility: Independent                Transfers Overall transfer level: Needs assistance Equipment used: Rolling walker (2 wheeled) Transfers: Sit to/from Stand Sit to Stand: Min guard;Supervision(Cues for hands with sit)            Ambulation/Gait Ambulation/Gait assistance: Supervision Gait Distance (Feet): 350 Feet(1 seated rest prior to steps) Assistive device: Rolling walker (2 wheeled)     Gait velocity interpretation: <1.8 ft/sec, indicate of risk for recurrent falls General Gait Details: Improved step through, heel strike and QS on L through stance phase   Stairs Stairs: Yes Stairs assistance: Min assist Stair Management: No rails;Step to pattern;Forwards(one crutch and 1 person assist other side)       Wheelchair  Mobility    Modified Rankin (Stroke Patients Only)       Balance Overall balance assessment: Needs assistance Sitting-balance support: Feet supported Sitting balance-Leahy Scale: Normal     Standing balance support: Bilateral upper extremity supported Standing balance-Leahy Scale: Fair                              Cognition Arousal/Alertness: Awake/alert Behavior During Therapy: WFL for tasks assessed/performed Overall Cognitive Status: Within Functional Limits for tasks assessed                                        Exercises Total Joint Exercises Quad Sets: Strengthening;Left;20 reps;Standing(with weight shift) Knee Flexion: AROM;Left;10 reps;Seated(3 positions each rep with 10 sec hold each) Goniometric ROM: -4 to 90 Other Exercises Other Exercises: Car transfers to pt/spouse    General Comments        Pertinent Vitals/Pain Pain Assessment: 0-10 Pain Score: 5  Pain Location: L knee Pain Descriptors / Indicators: Sore Pain Intervention(s): Monitored during session;Premedicated before session;Repositioned;Ice applied    Home Living                      Prior Function            PT Goals (current goals can now be found in the care plan section) Progress towards PT goals: Progressing toward  goals    Frequency    BID      PT Plan Current plan remains appropriate    Co-evaluation              AM-PAC PT "6 Clicks" Daily Activity  Outcome Measure  Difficulty turning over in bed (including adjusting bedclothes, sheets and blankets)?: None Difficulty moving from lying on back to sitting on the side of the bed? : None Difficulty sitting down on and standing up from a chair with arms (e.g., wheelchair, bedside commode, etc,.)?: A Little Help needed moving to and from a bed to chair (including a wheelchair)?: A Little Help needed walking in hospital room?: A Little Help needed climbing 3-5 steps with a railing?  : A Little 6 Click Score: 20    End of Session Equipment Utilized During Treatment: Gait belt Activity Tolerance: Patient tolerated treatment well Patient left: in chair;with chair alarm set;with call bell/phone within reach;with SCD's reapplied;Other (comment)   PT Visit Diagnosis: Unsteadiness on feet (R26.81);Ataxic gait (R26.0);Other abnormalities of gait and mobility (R26.89);Muscle weakness (generalized) (M62.81)     Time: 3335-4562 PT Time Calculation (min) (ACUTE ONLY): 51 min  Charges:  $Gait Training: 23-37 mins $Therapeutic Exercise: 8-22 mins                      Larae Grooms, PTA 03/17/2018, 11:19 AM

## 2018-03-24 ENCOUNTER — Telehealth: Payer: Self-pay

## 2018-03-24 NOTE — Telephone Encounter (Signed)
EMMI Follow-up: Noted on the report that patient had other questions.  I talked with Ms. Ekdahl and she had gotten her Rx's filled and was  aware of her follow-up appointments.  Felt like she was doing fine.  Verdis Frederickson w/[facility] had come out Saturday and she was waiting for PT to follow-up as she had signed a contact with them for 3-4 more sessions.    She said everyone was very professional and nice and she really liked the communication board in the room (PT - what to work on, Who RN is, Pain level).  Felt like we could use it a little more to include last BP reading or time at least.  She had staff members ask her what it was but she didn't write that down and couldn't remember it.  Said she received a lot of verbal instructions but couldn't remember them as she felt foggy from medications (usually a fist full at a time).  Maybe list time received or when next dose will be and family members could see what was going on.   Said there was some discrepancy of when to use the Cold Pac.  Dr. Eliberto Ivory said to use 24/7 but then was told as needed. Since her MD appt. Is not until 03/30/18 I suggested she call the MD office and have the RN check with the MD.    I let her know there would be another automated call with a different series of questions and to let us know if she had any further concerns or questions at that time.  I thanked her for her feedback and would forward to leadership.

## 2018-07-22 ENCOUNTER — Encounter
Admission: RE | Admit: 2018-07-22 | Discharge: 2018-07-22 | Disposition: A | Discharge status: Home / Self Care | Payer: BC Managed Care – PPO | Source: Ambulatory Visit | Attending: Orthopedic Surgery | Admitting: Orthopedic Surgery

## 2018-07-22 ENCOUNTER — Other Ambulatory Visit: Payer: Self-pay

## 2018-07-22 ENCOUNTER — Inpatient Hospital Stay: Admission: RE | Admit: 2018-07-22 | Payer: BC Managed Care – PPO | Source: Ambulatory Visit

## 2018-07-22 DIAGNOSIS — Z01818 Encounter for other preprocedural examination: Secondary | ICD-10-CM | POA: Insufficient documentation

## 2018-07-22 LAB — CBC
HEMATOCRIT: 43.1 % (ref 36.0–46.0)
Hemoglobin: 13.8 g/dL (ref 12.0–15.0)
MCH: 27.2 pg (ref 26.0–34.0)
MCHC: 32 g/dL (ref 30.0–36.0)
MCV: 85 fL (ref 80.0–100.0)
Platelets: 197 10*3/uL (ref 150–400)
RBC: 5.07 MIL/uL (ref 3.87–5.11)
RDW: 14.5 % (ref 11.5–15.5)
WBC: 6.2 10*3/uL (ref 4.0–10.5)
nRBC: 0 % (ref 0.0–0.2)

## 2018-07-22 LAB — PROTIME-INR
INR: 0.92
Prothrombin Time: 12.3 seconds (ref 11.4–15.2)

## 2018-07-22 LAB — COMPREHENSIVE METABOLIC PANEL
ALT: 21 U/L (ref 0–44)
AST: 24 U/L (ref 15–41)
Albumin: 4.6 g/dL (ref 3.5–5.0)
Alkaline Phosphatase: 64 U/L (ref 38–126)
Anion gap: 5 (ref 5–15)
BUN: 22 mg/dL (ref 8–23)
CO2: 29 mmol/L (ref 22–32)
CREATININE: 0.89 mg/dL (ref 0.44–1.00)
Calcium: 9.4 mg/dL (ref 8.9–10.3)
Chloride: 104 mmol/L (ref 98–111)
GFR calc Af Amer: >60 mL/min (ref >60)
GFR calc non Af Amer: >60 mL/min (ref >60)
Glucose, Bld: 93 mg/dL (ref 70–99)
Potassium: 4 mmol/L (ref 3.5–5.1)
Sodium: 138 mmol/L (ref 135–145)
Total Bilirubin: 0.8 mg/dL (ref 0.3–1.2)
Total Protein: 7.1 g/dL (ref 6.5–8.1)

## 2018-07-22 LAB — URINALYSIS, ROUTINE W REFLEX MICROSCOPIC
Bilirubin Urine: NEGATIVE
Glucose, UA: NEGATIVE mg/dL
Hgb urine dipstick: NEGATIVE
Ketones, ur: NEGATIVE mg/dL
Leukocytes, UA: NEGATIVE
Nitrite: NEGATIVE
Protein, ur: NEGATIVE mg/dL
SPECIFIC GRAVITY, URINE: 1.018 (ref 1.005–1.030)
pH: 6 (ref 5.0–8.0)

## 2018-07-22 LAB — C-REACTIVE PROTEIN: CRP: <0.8 mg/dL (ref <1.0)

## 2018-07-22 LAB — APTT: aPTT: 26 seconds (ref 24–36)

## 2018-07-22 LAB — SEDIMENTATION RATE: Sed Rate: 1 mm/hr (ref 0–30)

## 2018-07-22 LAB — SURGICAL PCR SCREEN
MRSA, PCR: NEGATIVE
Staphylococcus aureus: NEGATIVE

## 2018-07-22 LAB — TYPE AND SCREEN
ABO/RH(D): A POS
Antibody Screen: NEGATIVE

## 2018-07-22 NOTE — Patient Instructions (Signed)
Your procedure is scheduled on: Wed. 07/28/18 Report to Day Surgery. To find out your arrival time please call [phone] between 1PM - 3PM on Tues. 07/27/18.  Remember: Instructions that are not followed completely may result in serious medical risk,  up to and including death, or upon the discretion of your surgeon and anesthesiologist your  surgery may need to be rescheduled.     _X__ 1. Do not eat food after midnight the night before your procedure.                 No gum chewing or hard candies. You may drink clear liquids up to 2 hours                 before you are scheduled to arrive for your surgery- DO not drink clear                 liquids within 2 hours of the start of your surgery.                 Clear Liquids include:  water, apple juice without pulp, clear carbohydrate                 drink such as Clearfast of Gatorade, Black Coffee or Tea (Do not add                 anything to coffee or tea).  __X__2.  On the morning of surgery brush your teeth with toothpaste and water, you                may rinse your mouth with mouthwash if you wish.  Do not swallow any toothpaste of mouthwash.     _X__ 3.  No Alcohol for 24 hours before or after surgery.   ___ 4.  Do Not Smoke or use e-cigarettes For 24 Hours Prior to Your Surgery.                 Do not use any chewable tobacco products for at least 6 hours prior to                 surgery.  ____  5.  Bring all medications with you on the day of surgery if instructed.   __x__  6.  Notify your doctor if there is any change in your medical condition      (cold, fever, infections).     Do not wear jewelry, make-up, hairpins, clips or nail polish. Do not wear lotions, powders, or perfumes. You may wear deodorant. Do not shave 48 hours prior to surgery. Men may shave face and neck. Do not bring valuables to the hospital.    [facility] is not responsible for any belongings or valuables.  Contacts,  dentures or bridgework may not be worn into surgery. Leave your suitcase in the car. After surgery it may be brought to your room. For patients admitted to the hospital, discharge time is determined by your treatment team.   Patients discharged the day of surgery will not be allowed to drive home.   Please read over the following fact sheets that you were given:  _x___ Take these medicines the morning of surgery with A SIP OF WATER:    1. loratadine (CLARITIN) 10 MG tablet  2.   3.   4.  5.  6.  ____ Fleet Enema (as directed)   __x__ Use CHG Soap as directed  ____ Use inhalers on the  day of surgery  ____ Stop metformin 2 days prior to surgery    ____ Take 1/2 of usual insulin dose the night before surgery. No insulin the morning          of surgery.   ____ Stop Coumadin/Plavix/aspirin on   _x___ Stopped Anti-inflammatories naproxen sodium (ALEVE) 220 MG tablet already  May take Tylenol Max 4000 mg in 24 hours   _x___ Stop supplements until after surgery.  Misc Natural Products (TURMERIC CURCUMIN) CAPS,Omega-3 Fatty Acids (FISH OIL) 1200 MG CAPS,COLLAGEN PO  ____ Bring C-Pap to the hospital.

## 2018-07-23 LAB — URINE CULTURE
Culture: NO GROWTH
Special Requests: NORMAL

## 2018-07-23 NOTE — Pre-Procedure Instructions (Signed)
Messaged Dr. Marry Guan about potential allergy to steroids per patient history.  Should we d/c Decadron preop?

## 2018-07-23 NOTE — Pre-Procedure Instructions (Signed)
Dr. Marry Guan will investigate and decide if he wants to proceed with Decadron preop.  Will not Dc now.

## 2018-07-27 MED ORDER — CEFAZOLIN SODIUM-DEXTROSE 2-4 GM/100ML-% IV SOLN
2.0000 g | INTRAVENOUS | Status: AC
  Administered 2018-07-28: 2 g via INTRAVENOUS

## 2018-07-27 MED ORDER — TRANEXAMIC ACID-NACL 1000-0.7 MG/100ML-% IV SOLN
1000.0000 mg | INTRAVENOUS | Status: AC
  Administered 2018-07-28: 1000 mg via INTRAVENOUS
  Filled 2018-07-27: qty 100

## 2018-07-28 ENCOUNTER — Inpatient Hospital Stay: Payer: BC Managed Care – PPO

## 2018-07-28 ENCOUNTER — Encounter
Admission: AD | Disposition: A | Discharge status: Home Health | Payer: Self-pay | Source: Home / Self Care | Attending: Orthopedic Surgery

## 2018-07-28 ENCOUNTER — Inpatient Hospital Stay: Payer: BC Managed Care – PPO | Admitting: Certified Registered Nurse Anesthetist

## 2018-07-28 ENCOUNTER — Encounter: Payer: Self-pay | Admitting: Orthopedic Surgery

## 2018-07-28 ENCOUNTER — Inpatient Hospital Stay
Admission: AD | Admit: 2018-07-28 | Discharge: 2018-07-30 | DRG: 470 | Disposition: A | Discharge status: Home Health | Payer: BC Managed Care – PPO | Attending: Orthopedic Surgery | Admitting: Orthopedic Surgery

## 2018-07-28 ENCOUNTER — Other Ambulatory Visit: Payer: Self-pay

## 2018-07-28 DIAGNOSIS — Z8261 Family history of arthritis: Secondary | ICD-10-CM | POA: Diagnosis not present

## 2018-07-28 DIAGNOSIS — Z23 Encounter for immunization: Secondary | ICD-10-CM

## 2018-07-28 DIAGNOSIS — Z83438 Family history of other disorder of lipoprotein metabolism and other lipidemia: Secondary | ICD-10-CM | POA: Diagnosis not present

## 2018-07-28 DIAGNOSIS — M25761 Osteophyte, right knee: Secondary | ICD-10-CM | POA: Diagnosis present

## 2018-07-28 DIAGNOSIS — Z96659 Presence of unspecified artificial knee joint: Secondary | ICD-10-CM

## 2018-07-28 DIAGNOSIS — Z888 Allergy status to other drugs, medicaments and biological substances status: Secondary | ICD-10-CM | POA: Diagnosis not present

## 2018-07-28 DIAGNOSIS — E785 Hyperlipidemia, unspecified: Secondary | ICD-10-CM | POA: Diagnosis present

## 2018-07-28 DIAGNOSIS — M1711 Unilateral primary osteoarthritis, right knee: Secondary | ICD-10-CM | POA: Diagnosis present

## 2018-07-28 HISTORY — PX: KNEE ARTHROPLASTY: SHX992

## 2018-07-28 SURGERY — ARTHROPLASTY, KNEE, TOTAL, USING IMAGELESS COMPUTER-ASSISTED NAVIGATION
Anesthesia: Spinal | Site: Knee | Laterality: Right

## 2018-07-28 MED ORDER — PROPOFOL 10 MG/ML IV BOLUS
INTRAVENOUS | Status: DC | PRN
  Administered 2018-07-28 (×2): 19 mg via INTRAVENOUS
  Administered 2018-07-28 (×2): 40 mg via INTRAVENOUS
  Administered 2018-07-28 (×3): 19 mg via INTRAVENOUS

## 2018-07-28 MED ORDER — BISACODYL 10 MG RE SUPP
10.0000 mg | Freq: Every day | RECTAL | Status: DC | PRN
  Administered 2018-07-30: 10 mg via RECTAL
  Filled 2018-07-28: qty 1

## 2018-07-28 MED ORDER — ROSUVASTATIN CALCIUM 10 MG PO TABS
5.0000 mg | ORAL_TABLET | Freq: Every day | ORAL | Status: DC
  Administered 2018-07-28 – 2018-07-29 (×2): 5 mg via ORAL
  Filled 2018-07-28 (×2): qty 1

## 2018-07-28 MED ORDER — OMEGA-3-ACID ETHYL ESTERS 1 G PO CAPS
1.0000 g | ORAL_CAPSULE | Freq: Every day | ORAL | Status: DC
  Administered 2018-07-29 – 2018-07-30 (×2): 1 g via ORAL
  Filled 2018-07-28 (×2): qty 1

## 2018-07-28 MED ORDER — PANTOPRAZOLE SODIUM 40 MG PO TBEC
40.0000 mg | DELAYED_RELEASE_TABLET | Freq: Two times a day (BID) | ORAL | Status: DC
  Administered 2018-07-28 – 2018-07-30 (×4): 40 mg via ORAL
  Filled 2018-07-28 (×4): qty 1

## 2018-07-28 MED ORDER — DEXAMETHASONE SODIUM PHOSPHATE 10 MG/ML IJ SOLN
8.0000 mg | Freq: Once | INTRAMUSCULAR | Status: AC
  Administered 2018-07-28: 8 mg via INTRAVENOUS

## 2018-07-28 MED ORDER — FENTANYL CITRATE (PF) 100 MCG/2ML IJ SOLN
INTRAMUSCULAR | Status: AC
  Filled 2018-07-28: qty 2

## 2018-07-28 MED ORDER — PROPOFOL 500 MG/50ML IV EMUL
INTRAVENOUS | Status: AC
  Filled 2018-07-28: qty 50

## 2018-07-28 MED ORDER — GLYCOPYRROLATE 0.2 MG/ML IJ SOLN
INTRAMUSCULAR | Status: DC | PRN
  Administered 2018-07-28: 0.2 mg via INTRAVENOUS

## 2018-07-28 MED ORDER — ONDANSETRON HCL 4 MG PO TABS: 4.0000 mg | ORAL_TABLET | Freq: Four times a day (QID) | ORAL | Status: DC | PRN

## 2018-07-28 MED ORDER — ONDANSETRON HCL 4 MG/2ML IJ SOLN: 4.0000 mg | Freq: Once | INTRAMUSCULAR | Status: DC | PRN

## 2018-07-28 MED ORDER — LIDOCAINE HCL (PF) 2 % IJ SOLN
INTRAMUSCULAR | Status: AC
  Filled 2018-07-28: qty 10

## 2018-07-28 MED ORDER — DEXAMETHASONE SODIUM PHOSPHATE 10 MG/ML IJ SOLN
INTRAMUSCULAR | Status: AC
  Filled 2018-07-28: qty 1

## 2018-07-28 MED ORDER — OXYCODONE HCL 5 MG PO TABS: 10.0000 mg | ORAL_TABLET | ORAL | Status: DC | PRN

## 2018-07-28 MED ORDER — OXYCODONE HCL 5 MG PO TABS: 5.0000 mg | ORAL_TABLET | ORAL | Status: DC | PRN

## 2018-07-28 MED ORDER — MIDAZOLAM HCL 2 MG/2ML IJ SOLN
INTRAMUSCULAR | Status: AC
  Filled 2018-07-28: qty 2

## 2018-07-28 MED ORDER — SENNOSIDES-DOCUSATE SODIUM 8.6-50 MG PO TABS
1.0000 | ORAL_TABLET | Freq: Two times a day (BID) | ORAL | Status: DC
  Administered 2018-07-28 – 2018-07-30 (×4): 1 via ORAL
  Filled 2018-07-28 (×4): qty 1

## 2018-07-28 MED ORDER — LIDOCAINE HCL (CARDIAC) PF 100 MG/5ML IV SOSY
PREFILLED_SYRINGE | INTRAVENOUS | Status: DC | PRN
  Administered 2018-07-28: 100 mg via INTRAVENOUS

## 2018-07-28 MED ORDER — ALUM & MAG HYDROXIDE-SIMETH 200-200-20 MG/5ML PO SUSP: 30.0000 mL | ORAL | Status: DC | PRN

## 2018-07-28 MED ORDER — MAGNESIUM HYDROXIDE 400 MG/5ML PO SUSP
30.0000 mL | Freq: Every day | ORAL | Status: DC
  Administered 2018-07-29 – 2018-07-30 (×2): 30 mL via ORAL
  Filled 2018-07-28 (×2): qty 30

## 2018-07-28 MED ORDER — FLEET ENEMA 7-19 GM/118ML RE ENEM: 1.0000 | ENEMA | Freq: Once | RECTAL | Status: DC | PRN

## 2018-07-28 MED ORDER — METOCLOPRAMIDE HCL 10 MG PO TABS
10.0000 mg | ORAL_TABLET | Freq: Three times a day (TID) | ORAL | Status: DC
  Administered 2018-07-28 – 2018-07-30 (×7): 10 mg via ORAL
  Filled 2018-07-28 (×7): qty 1

## 2018-07-28 MED ORDER — CELECOXIB 200 MG PO CAPS
200.0000 mg | ORAL_CAPSULE | Freq: Two times a day (BID) | ORAL | Status: DC
  Administered 2018-07-28 – 2018-07-30 (×4): 200 mg via ORAL
  Filled 2018-07-28 (×4): qty 1

## 2018-07-28 MED ORDER — TRAMADOL HCL 50 MG PO TABS
50.0000 mg | ORAL_TABLET | ORAL | Status: DC | PRN
  Administered 2018-07-28 – 2018-07-30 (×6): 50 mg via ORAL
  Filled 2018-07-28 (×6): qty 1

## 2018-07-28 MED ORDER — ONDANSETRON HCL 4 MG/2ML IJ SOLN: 4.0000 mg | Freq: Four times a day (QID) | INTRAMUSCULAR | Status: DC | PRN

## 2018-07-28 MED ORDER — METOCLOPRAMIDE HCL 10 MG PO TABS: 5.0000 mg | ORAL_TABLET | Freq: Three times a day (TID) | ORAL | Status: DC | PRN

## 2018-07-28 MED ORDER — ARTIFICIAL TEARS OPHTHALMIC OINT
TOPICAL_OINTMENT | Freq: Two times a day (BID) | OPHTHALMIC | Status: DC | PRN
  Filled 2018-07-28: qty 3.5

## 2018-07-28 MED ORDER — GABAPENTIN 300 MG PO CAPS
ORAL_CAPSULE | ORAL | Status: AC
  Filled 2018-07-28: qty 1

## 2018-07-28 MED ORDER — BUPIVACAINE HCL (PF) 0.25 % IJ SOLN
INTRAMUSCULAR | Status: AC
  Filled 2018-07-28: qty 60

## 2018-07-28 MED ORDER — FAMOTIDINE 20 MG PO TABS
ORAL_TABLET | ORAL | Status: AC
  Filled 2018-07-28: qty 1

## 2018-07-28 MED ORDER — BUPIVACAINE HCL (PF) 0.5 % IJ SOLN
INTRAMUSCULAR | Status: DC | PRN
  Administered 2018-07-28: 2.5 mL via INTRATHECAL

## 2018-07-28 MED ORDER — CEFAZOLIN SODIUM-DEXTROSE 2-4 GM/100ML-% IV SOLN
2.0000 g | Freq: Four times a day (QID) | INTRAVENOUS | Status: AC
  Administered 2018-07-28 – 2018-07-29 (×4): 2 g via INTRAVENOUS
  Filled 2018-07-28 (×4): qty 100

## 2018-07-28 MED ORDER — FLUOCINOLONE ACETONIDE 0.01 % OT OIL: 1.0000 [drp] | TOPICAL_OIL | OTIC | Status: DC

## 2018-07-28 MED ORDER — HYDROMORPHONE HCL 1 MG/ML IJ SOLN
INTRAMUSCULAR | Status: DC | PRN
  Administered 2018-07-28: 1 mg via INTRAVENOUS

## 2018-07-28 MED ORDER — CEFAZOLIN SODIUM-DEXTROSE 2-4 GM/100ML-% IV SOLN
INTRAVENOUS | Status: AC
  Filled 2018-07-28: qty 100

## 2018-07-28 MED ORDER — MENTHOL 3 MG MT LOZG
1.0000 | LOZENGE | OROMUCOSAL | Status: DC | PRN
  Filled 2018-07-28: qty 9

## 2018-07-28 MED ORDER — GABAPENTIN 300 MG PO CAPS
300.0000 mg | ORAL_CAPSULE | Freq: Every day | ORAL | Status: DC
  Administered 2018-07-28 – 2018-07-29 (×2): 300 mg via ORAL
  Filled 2018-07-28 (×2): qty 1

## 2018-07-28 MED ORDER — FENTANYL CITRATE (PF) 100 MCG/2ML IJ SOLN: 25.0000 ug | INTRAMUSCULAR | Status: DC | PRN

## 2018-07-28 MED ORDER — TRANEXAMIC ACID-NACL 1000-0.7 MG/100ML-% IV SOLN
1000.0000 mg | Freq: Once | INTRAVENOUS | Status: AC
  Administered 2018-07-28: 1000 mg via INTRAVENOUS
  Filled 2018-07-28: qty 100

## 2018-07-28 MED ORDER — GABAPENTIN 300 MG PO CAPS
300.0000 mg | ORAL_CAPSULE | Freq: Once | ORAL | Status: AC
  Administered 2018-07-28: 300 mg via ORAL

## 2018-07-28 MED ORDER — PROPOFOL 500 MG/50ML IV EMUL
INTRAVENOUS | Status: DC | PRN
  Administered 2018-07-28: 90 ug/kg/min via INTRAVENOUS
  Administered 2018-07-28: 60 ug/kg/min via INTRAVENOUS

## 2018-07-28 MED ORDER — SODIUM CHLORIDE 0.9 % IV SOLN
INTRAVENOUS | Status: DC | PRN
  Administered 2018-07-28: 60 mL

## 2018-07-28 MED ORDER — HYDROMORPHONE HCL 1 MG/ML IJ SOLN
INTRAMUSCULAR | Status: AC
  Filled 2018-07-28: qty 1

## 2018-07-28 MED ORDER — LACTATED RINGERS IV SOLN
INTRAVENOUS | Status: DC
  Administered 2018-07-28 (×2): via INTRAVENOUS

## 2018-07-28 MED ORDER — SODIUM CHLORIDE 0.9 % IV SOLN
INTRAVENOUS | Status: DC
  Administered 2018-07-28 – 2018-07-29 (×2): via INTRAVENOUS

## 2018-07-28 MED ORDER — ACETAMINOPHEN 325 MG PO TABS: 325.0000 mg | ORAL_TABLET | Freq: Four times a day (QID) | ORAL | Status: DC | PRN

## 2018-07-28 MED ORDER — IVERMECTIN 1 % EX CREA: 1.0000 "application " | TOPICAL_CREAM | Freq: Every day | CUTANEOUS | Status: DC | PRN

## 2018-07-28 MED ORDER — GLYCOPYRROLATE 0.2 MG/ML IJ SOLN
INTRAMUSCULAR | Status: AC
  Filled 2018-07-28: qty 1

## 2018-07-28 MED ORDER — METOCLOPRAMIDE HCL 5 MG/ML IJ SOLN: 5.0000 mg | Freq: Three times a day (TID) | INTRAMUSCULAR | Status: DC | PRN

## 2018-07-28 MED ORDER — SODIUM CHLORIDE FLUSH 0.9 % IV SOLN
INTRAVENOUS | Status: AC
  Filled 2018-07-28: qty 40

## 2018-07-28 MED ORDER — MAGNESIUM OXIDE 250 MG PO TABS: 250.0000 mg | ORAL_TABLET | Freq: Every day | ORAL | Status: DC

## 2018-07-28 MED ORDER — BUPIVACAINE LIPOSOME 1.3 % IJ SUSP
INTRAMUSCULAR | Status: AC
  Filled 2018-07-28: qty 20

## 2018-07-28 MED ORDER — BUPIVACAINE HCL (PF) 0.25 % IJ SOLN
INTRAMUSCULAR | Status: DC | PRN
  Administered 2018-07-28: 60 mL

## 2018-07-28 MED ORDER — HYDROMORPHONE HCL 1 MG/ML IJ SOLN: 0.5000 mg | INTRAMUSCULAR | Status: DC | PRN

## 2018-07-28 MED ORDER — SODIUM CHLORIDE 0.9 % IV SOLN
INTRAVENOUS | Status: DC | PRN
  Administered 2018-07-28: 08:00:00

## 2018-07-28 MED ORDER — LORATADINE 10 MG PO TABS
10.0000 mg | ORAL_TABLET | Freq: Every day | ORAL | Status: DC
  Administered 2018-07-29 – 2018-07-30 (×2): 10 mg via ORAL
  Filled 2018-07-28 (×2): qty 1

## 2018-07-28 MED ORDER — SODIUM CHLORIDE 0.9 % IV SOLN
INTRAVENOUS | Status: DC | PRN
  Administered 2018-07-28: 30 ug/min via INTRAVENOUS

## 2018-07-28 MED ORDER — CHLORHEXIDINE GLUCONATE 4 % EX LIQD: 60.0000 mL | Freq: Once | CUTANEOUS | Status: DC

## 2018-07-28 MED ORDER — MIDAZOLAM HCL 5 MG/5ML IJ SOLN
INTRAMUSCULAR | Status: DC | PRN
  Administered 2018-07-28: 2 mg via INTRAVENOUS

## 2018-07-28 MED ORDER — CARBOXYMETHYLCELLUL-GLYCERIN 0.5-0.9 % OP SOLN: 1.0000 [drp] | Freq: Two times a day (BID) | OPHTHALMIC | Status: DC | PRN

## 2018-07-28 MED ORDER — PHENYLEPHRINE HCL 10 MG/ML IJ SOLN
INTRAMUSCULAR | Status: AC
  Filled 2018-07-28: qty 2

## 2018-07-28 MED ORDER — ACETAMINOPHEN 10 MG/ML IV SOLN
1000.0000 mg | Freq: Four times a day (QID) | INTRAVENOUS | Status: AC
  Administered 2018-07-28 – 2018-07-29 (×4): 1000 mg via INTRAVENOUS
  Filled 2018-07-28 (×4): qty 100

## 2018-07-28 MED ORDER — CELECOXIB 200 MG PO CAPS
ORAL_CAPSULE | ORAL | Status: AC
  Filled 2018-07-28: qty 2

## 2018-07-28 MED ORDER — FAMOTIDINE 20 MG PO TABS
20.0000 mg | ORAL_TABLET | Freq: Once | ORAL | Status: AC
  Administered 2018-07-28: 20 mg via ORAL

## 2018-07-28 MED ORDER — BUPIVACAINE HCL (PF) 0.5 % IJ SOLN
INTRAMUSCULAR | Status: AC
  Filled 2018-07-28: qty 10

## 2018-07-28 MED ORDER — GENTAMICIN SULFATE 40 MG/ML IJ SOLN
INTRAMUSCULAR | Status: AC
  Filled 2018-07-28: qty 6

## 2018-07-28 MED ORDER — PHENOL 1.4 % MT LIQD
1.0000 | OROMUCOSAL | Status: DC | PRN
  Filled 2018-07-28: qty 177

## 2018-07-28 MED ORDER — FENTANYL CITRATE (PF) 100 MCG/2ML IJ SOLN
INTRAMUSCULAR | Status: DC | PRN
  Administered 2018-07-28 (×2): 50 ug via INTRAVENOUS

## 2018-07-28 MED ORDER — FERROUS SULFATE 325 (65 FE) MG PO TABS
325.0000 mg | ORAL_TABLET | Freq: Two times a day (BID) | ORAL | Status: DC
  Administered 2018-07-28 – 2018-07-30 (×4): 325 mg via ORAL
  Filled 2018-07-28 (×4): qty 1

## 2018-07-28 MED ORDER — DIPHENHYDRAMINE HCL 12.5 MG/5ML PO ELIX: 12.5000 mg | ORAL_SOLUTION | ORAL | Status: DC | PRN

## 2018-07-28 MED ORDER — CELECOXIB 200 MG PO CAPS
400.0000 mg | ORAL_CAPSULE | Freq: Once | ORAL | Status: AC
  Administered 2018-07-28: 400 mg via ORAL

## 2018-07-28 MED ORDER — PNEUMOCOCCAL VAC POLYVALENT 25 MCG/0.5ML IJ INJ
0.5000 mL | INJECTION | INTRAMUSCULAR | Status: AC
  Administered 2018-07-30: 0.5 mL via INTRAMUSCULAR
  Filled 2018-07-28: qty 0.5

## 2018-07-28 MED ORDER — ACETAMINOPHEN 10 MG/ML IV SOLN
INTRAVENOUS | Status: DC | PRN
  Administered 2018-07-28: 1000 mg via INTRAVENOUS

## 2018-07-28 MED ORDER — ENOXAPARIN SODIUM 30 MG/0.3ML ~~LOC~~ SOLN
30.0000 mg | Freq: Two times a day (BID) | SUBCUTANEOUS | Status: DC
  Administered 2018-07-29 – 2018-07-30 (×3): 30 mg via SUBCUTANEOUS
  Filled 2018-07-28 (×3): qty 0.3

## 2018-07-28 SURGICAL SUPPLY — 69 items
ATTUNE MED DOME PAT 41 KNEE (Knees) ×2 IMPLANT
ATTUNE MED DOME PAT 41MM KNEE (Knees) ×1 IMPLANT
ATTUNE PS FEM RT SZ 8 CEM KNEE (Femur) ×3 IMPLANT
ATTUNE PSRP INSR SZ8 5 KNEE (Insert) ×2 IMPLANT
ATTUNE PSRP INSR SZ8 5MM KNEE (Insert) ×1 IMPLANT
BASE TIBIA ATTUNE KNEE SYS SZ6 (Knees) ×1 IMPLANT
BATTERY INSTRU NAVIGATION (MISCELLANEOUS) ×12 IMPLANT
BLADE SAW 70X12.5 (BLADE) ×3 IMPLANT
BLADE SAW 90X13X1.19 OSCILLAT (BLADE) ×3 IMPLANT
BLADE SAW 90X25X1.19 OSCILLAT (BLADE) ×3 IMPLANT
CANISTER SUCT 1200ML W/VALVE (MISCELLANEOUS) ×3 IMPLANT
CANISTER SUCT 3000ML PPV (MISCELLANEOUS) ×6 IMPLANT
CEMENT HV SMART SET (Cement) ×6 IMPLANT
COOLER POLAR GLACIER W/PUMP (MISCELLANEOUS) ×3 IMPLANT
COVER WAND RF STERILE (DRAPES) ×3 IMPLANT
CUFF TOURN 24 STER (MISCELLANEOUS) IMPLANT
CUFF TOURN 30 STER DUAL PORT (MISCELLANEOUS) ×3 IMPLANT
DRAPE SHEET LG 3/4 BI-LAMINATE (DRAPES) ×3 IMPLANT
DRSG DERMACEA 8X12 NADH (GAUZE/BANDAGES/DRESSINGS) ×3 IMPLANT
DRSG OPSITE POSTOP 4X14 (GAUZE/BANDAGES/DRESSINGS) ×3 IMPLANT
DRSG TEGADERM 4X4.75 (GAUZE/BANDAGES/DRESSINGS) ×3 IMPLANT
DURAPREP 26ML APPLICATOR (WOUND CARE) ×6 IMPLANT
ELECT CAUTERY BLADE 6.4 (BLADE) ×3 IMPLANT
ELECT REM PT RETURN 9FT ADLT (ELECTROSURGICAL) ×3
ELECTRODE REM PT RTRN 9FT ADLT (ELECTROSURGICAL) ×1 IMPLANT
EX-PIN ORTHOLOCK NAV 4X150 (PIN) ×6 IMPLANT
GLOVE BIOGEL M STRL SZ7.5 (GLOVE) ×6 IMPLANT
GLOVE BIOGEL PI IND STRL 7.5 (GLOVE) ×8 IMPLANT
GLOVE BIOGEL PI INDICATOR 7.5 (GLOVE) ×16
GLOVE INDICATOR 8.0 STRL GRN (GLOVE) ×3 IMPLANT
GOWN STRL REUS W/ TWL LRG LVL3 (GOWN DISPOSABLE) ×3 IMPLANT
GOWN STRL REUS W/TWL LRG LVL3 (GOWN DISPOSABLE) ×6
HEMOVAC 400CC 10FR (MISCELLANEOUS) ×3 IMPLANT
HOLDER FOLEY CATH W/STRAP (MISCELLANEOUS) ×3 IMPLANT
HOOD PEEL AWAY FLYTE STAYCOOL (MISCELLANEOUS) ×9 IMPLANT
KIT TURNOVER KIT A (KITS) ×3 IMPLANT
KNIFE SCULPS 14X20 (INSTRUMENTS) ×3 IMPLANT
LABEL OR SOLS (LABEL) ×3 IMPLANT
NDL SAFETY ECLIPSE 18X1.5 (NEEDLE) ×1 IMPLANT
NEEDLE HYPO 18GX1.5 SHARP (NEEDLE) ×2
NEEDLE SPNL 20GX3.5 QUINCKE YW (NEEDLE) ×6 IMPLANT
NS IRRIG 500ML POUR BTL (IV SOLUTION) ×3 IMPLANT
PACK TOTAL KNEE (MISCELLANEOUS) ×3 IMPLANT
PAD WRAPON POLAR KNEE (MISCELLANEOUS) ×1 IMPLANT
PENCIL SMOKE ULTRAEVAC 22 CON (MISCELLANEOUS) ×3 IMPLANT
PIN DRILL QUICK PACK ×3 IMPLANT
PIN FIXATION 1/8DIA X 3INL (PIN) ×9 IMPLANT
PULSAVAC PLUS IRRIG FAN TIP (DISPOSABLE) ×3
SOL .9 NS 3000ML IRR  AL (IV SOLUTION) ×2
SOL .9 NS 3000ML IRR UROMATIC (IV SOLUTION) ×1 IMPLANT
SOL PREP PVP 2OZ (MISCELLANEOUS) ×3
SOLUTION PREP PVP 2OZ (MISCELLANEOUS) ×1 IMPLANT
SPONGE DRAIN TRACH 4X4 STRL 2S (GAUZE/BANDAGES/DRESSINGS) ×3 IMPLANT
STAPLER SKIN PROX 35W (STAPLE) ×3 IMPLANT
STOCKINETTE IMPERV 14X48 (MISCELLANEOUS) IMPLANT
STRAP TIBIA SHORT (MISCELLANEOUS) ×3 IMPLANT
SUCTION FRAZIER HANDLE 10FR (MISCELLANEOUS) ×2
SUCTION TUBE FRAZIER 10FR DISP (MISCELLANEOUS) ×1 IMPLANT
SUT VIC AB 0 CT1 36 (SUTURE) ×3 IMPLANT
SUT VIC AB 1 CT1 36 (SUTURE) ×6 IMPLANT
SUT VIC AB 2-0 CT2 27 (SUTURE) ×3 IMPLANT
SYR 20CC LL (SYRINGE) ×3 IMPLANT
SYR 30ML LL (SYRINGE) ×6 IMPLANT
TIBIA ATTUNE KNEE SYS BASE SZ6 (Knees) ×3 IMPLANT
TIP FAN IRRIG PULSAVAC PLUS (DISPOSABLE) ×1 IMPLANT
TOWEL OR 17X26 4PK STRL BLUE (TOWEL DISPOSABLE) ×3 IMPLANT
TOWER CARTRIDGE SMART MIX (DISPOSABLE) ×3 IMPLANT
TRAY FOLEY MTR SLVR 16FR STAT (SET/KITS/TRAYS/PACK) ×3 IMPLANT
WRAPON POLAR PAD KNEE (MISCELLANEOUS) ×3

## 2018-07-28 NOTE — Anesthesia Post-op Follow-up Note (Signed)
Anesthesia QCDR form completed.        

## 2018-07-28 NOTE — Transfer of Care (Signed)
Immediate Anesthesia Transfer of Care Note  Patient: Michele Cruz  Procedure(s) Performed: COMPUTER ASSISTED TOTAL KNEE ARTHROPLASTY (Right Knee)  Patient Location: PACU  Anesthesia Type:Spinal  Level of Consciousness: awake, alert , oriented and patient cooperative  Airway & Oxygen Therapy: Patient Spontanous Breathing and Patient connected to nasal cannula oxygen  Post-op Assessment: Report given to RN and Post -op Vital signs reviewed and stable  Post vital signs: Reviewed and stable  Last Vitals:  Vitals Value Taken Time  BP 106/60 07/28/2018 11:19 AM  Temp    Pulse 85 07/28/2018 11:19 AM  Resp 17 07/28/2018 11:19 AM  SpO2 99 % 07/28/2018 11:19 AM  Vitals shown include unvalidated device data.  Last Pain:  Vitals:   07/28/18 0630  TempSrc: Oral  PainSc: 0-No pain         Complications: No apparent anesthesia complications

## 2018-07-28 NOTE — Progress Notes (Signed)
Pt admitted to unit from PACU. Pt is A&Ox4, VSS. Surgical dressing to right knee CDI; hemovac charged, foley draining, polar care in place. Pt's RLE placed in bone foam. Pt and her husband oriented to room and plan of care. Bed in lowest position, call bell within reach.

## 2018-07-28 NOTE — Op Note (Signed)
OPERATIVE NOTE  DATE OF SURGERY:  07/28/2018  PATIENT NAME:  Michele Cruz   DOB: [birth year]  MRN: [id number]  PRE-OPERATIVE DIAGNOSIS: Degenerative arthrosis of the right knee, primary  POST-OPERATIVE DIAGNOSIS:  Same  PROCEDURE:  Right total knee arthroplasty using computer-assisted navigation  SURGEON:  Marciano Sequin. M.D.  ASSISTANT: Melissa Noon, PA-C (present and scrubbed throughout the case, critical for assistance with exposure, retraction, instrumentation, and closure)  ANESTHESIA: spinal  ESTIMATED BLOOD LOSS: 50 mL  FLUIDS REPLACED: 1200 mL of crystalloid  TOURNIQUET TIME: 102 minutes  DRAINS: 2 medium Hemovac drains  SOFT TISSUE RELEASES: Anterior cruciate ligament, posterior cruciate ligament, deep medial collateral ligament, patellofemoral ligament  IMPLANTS UTILIZED: DePuy Attune size 8 posterior stabilized femoral component (cemented), size 6 rotating platform tibial component (cemented), 41 mm medialized dome patella (cemented), and a 5 mm stabilized rotating platform polyethylene insert.  INDICATIONS FOR SURGERY: Michele Cruz is a 63 y.o. year old female with a long history of progressive knee pain. X-rays demonstrated severe degenerative changes in tricompartmental fashion. The patient had not seen any significant improvement despite conservative nonsurgical intervention. After discussion of the risks and benefits of surgical intervention, the patient expressed understanding of the risks benefits and agree with plans for total knee arthroplasty.   The risks, benefits, and alternatives were discussed at length including but not limited to the risks of infection, bleeding, nerve injury, stiffness, blood clots, the need for revision surgery, cardiopulmonary complications, among others, and they were willing to proceed.  PROCEDURE IN DETAIL: The patient was brought into the operating room and, after adequate spinal anesthesia was achieved, a tourniquet was  placed on the patient's upper thigh. The patient's knee and leg were cleaned and prepped with alcohol and DuraPrep and draped in the usual sterile fashion. A "timeout" was performed as per usual protocol. The lower extremity was exsanguinated using an Esmarch, and the tourniquet was inflated to 300 mmHg. An anterior longitudinal incision was made followed by a standard mid vastus approach. The deep fibers of the medial collateral ligament were elevated in a subperiosteal fashion off of the medial flare of the tibia so as to maintain a continuous soft tissue sleeve. The patella was subluxed laterally and the patellofemoral ligament was incised. Inspection of the knee demonstrated severe degenerative changes with full-thickness loss of articular cartilage. Osteophytes were debrided using a rongeur. Anterior and posterior cruciate ligaments were excised. Two 4.0 mm Schanz pins were inserted in the femur and into the tibia for attachment of the array of trackers used for computer-assisted navigation. Hip center was identified using a circumduction technique. Distal landmarks were mapped using the computer. The distal femur and proximal tibia were mapped using the computer. The distal femoral cutting guide was positioned using computer-assisted navigation so as to achieve a 5 distal valgus cut. The femur was sized and it was felt that a size 8 femoral component was appropriate. A size 8 femoral cutting guide was positioned and the anterior cut was performed and verified using the computer. This was followed by completion of the posterior and chamfer cuts. Femoral cutting guide for the central box was then positioned in the center box cut was performed.  Attention was then directed to the proximal tibia. Medial and lateral menisci were excised. The extramedullary tibial cutting guide was positioned using computer-assisted navigation so as to achieve a 0 varus-valgus alignment and 3 posterior slope. The cut was  performed and verified using the computer. The proximal tibia was sized and  it was felt that a size 6 tibial tray was appropriate. Tibial and femoral trials were inserted followed by insertion of a 5 mm polyethylene insert. This allowed for excellent mediolateral soft tissue balancing both in flexion and in full extension. Finally, the patella was cut and prepared so as to accommodate a 41 mm medialized dome patella. A patella trial was placed and the knee was placed through a range of motion with excellent patellar tracking appreciated. The femoral trial was removed after debridement of posterior osteophytes. The central post-hole for the tibial component was reamed followed by insertion of a keel punch. Tibial trials were then removed. Cut surfaces of bone were irrigated with copious amounts of normal saline with antibiotic solution using pulsatile lavage and then suctioned dry. Polymethylmethacrylate cement was prepared in the usual fashion using a vacuum mixer. Cement was applied to the cut surface of the proximal tibia as well as along the undersurface of a size 5 rotating platform tibial component. Tibial component was positioned and impacted into place. Excess cement was removed using [employer] fast streamer. Cement was then applied to the cut surfaces of the femur as well as along the posterior flanges of the size 8 femoral component. The femoral component was positioned and impacted into place. Excess cement was removed using [employer] fast streamer. A 5 mm polyethylene trial was inserted and the knee was brought into full extension with steady axial compression applied. Finally, cement was applied to the backside of a 41 mm medialized dome patella and the patellar component was positioned and patellar clamp applied. Excess cement was removed using [employer] fast streamer. After adequate curing of the cement, the tourniquet was deflated after a total tourniquet time of 102 minutes. Hemostasis was achieved using electrocautery. The  knee was irrigated with copious amounts of normal saline with antibiotic solution using pulsatile lavage and then suctioned dry. 20 mL of 1.3% Exparel and 60 mL of 0.25% Marcaine in 40 mL of normal saline was injected along the posterior capsule, medial and lateral gutters, and along the arthrotomy site. A 5 mm stabilized rotating platform polyethylene insert was inserted and the knee was placed through a range of motion with excellent mediolateral soft tissue balancing appreciated and excellent patellar tracking noted. 2 medium drains were placed in the wound bed and brought out through separate stab incisions. The medial parapatellar portion of the incision was reapproximated using interrupted sutures of #1 Vicryl. Subcutaneous tissue was approximated in layers using first #0 Vicryl followed #2-0 Vicryl. The skin was approximated with skin staples. A sterile dressing was applied.  The patient tolerated the procedure well and was transported to the recovery room in stable condition.    James P. Holley Bouche., M.D.

## 2018-07-28 NOTE — H&P (Signed)
The patient has been re-examined, and the chart reviewed, and there have been no interval changes to the documented history and physical.    The risks, benefits, and alternatives have been discussed at length. The patient expressed understanding of the risks benefits and agreed with plans for surgical intervention.  Shantel Helwig P. Illene Sweeting, Jr. M.D.    

## 2018-07-28 NOTE — Progress Notes (Signed)
PT Cancellation Note  Patient Details Name: Michele Cruz MRN: [id number] DOB: 04/13/1956   Cancelled Treatment:    Reason Eval/Treat Not Completed: Medical issues which prohibited therapy- patient has no sensation on right LE, no ability to wiggle toes yet. PT will be held until tomorrow when appropriate.     Winfield Caba 07/28/2018, 2:57 PM

## 2018-07-28 NOTE — Anesthesia Procedure Notes (Signed)
Spinal  Patient location during procedure: OR Staffing Performed: anesthesiologist  Preanesthetic Checklist Completed: patient identified, site marked, surgical consent, pre-op evaluation, timeout performed, IV checked, risks and benefits discussed and monitors and equipment checked Spinal Block Patient position: sitting Prep: Betadine Patient monitoring: heart rate, continuous pulse ox, blood pressure and cardiac monitor Approach: midline Location: L4-5 Injection technique: single-shot Needle Needle type: Whitacre  Needle gauge: 22 G Needle length: 9 cm Assessment Sensory level: T4 Additional Notes Original SAB insufficient with no evidence of block setup.  Decision to repeat with 2.5 cc Isobaric Bupivicaine.  Negative paresthesia. Negative blood return. Positive free-flowing CSF. Expiration date of kit checked and confirmed. Patient tolerated procedure well, without complications.

## 2018-07-28 NOTE — Anesthesia Preprocedure Evaluation (Signed)
Anesthesia Evaluation  Patient identified by MRN, date of birth, ID band Patient awake    Reviewed: Allergy & Precautions, H&P , NPO status , Patient's Chart, lab work & pertinent test results, reviewed documented beta blocker date and time   Airway Mallampati: II   Neck ROM: full    Dental  (+) Teeth Intact   Pulmonary asthma ,    Pulmonary exam normal        Cardiovascular Exercise Tolerance: Good Normal cardiovascular exam+ Valvular Problems/Murmurs  Rhythm:regular Rate:Normal     Neuro/Psych PSYCHIATRIC DISORDERS Anxiety  Neuromuscular disease    GI/Hepatic negative GI ROS, Neg liver ROS,   Endo/Other  negative endocrine ROS  Renal/GU negative Renal ROS  negative genitourinary   Musculoskeletal   Abdominal   Peds  Hematology  (+) Blood dyscrasia, anemia ,   Anesthesia Other Findings Past Medical History: No date: Allergic rhinitis No date: Anemia, iron deficiency No date: Anxiety No date: Arthritis No date: Asthma     Comment:  no symptoms last 25 years No date: Colon polyps No date: Family history of adverse reaction to anesthesia     Comment:  sister PONV No date: Heavy menses No date: Hemorrhoids No date: Hidradenitis suppurativa 06/1999: History of exercise stress test     Comment:  neg 2003: History of MRI     Comment:  neg per patient 2007: History of MRI     Comment:  right quad fatty defect 03/2004: History of MRI of spine     Comment:  C-5, C5-6 disk protrusion No date: Hyperlipidemia     Comment:  LDL No date: Rosacea No date: Spondylosis     Comment:  deg lumbar disc dz No date: Wisdom teeth removed No date: Zoster Past Surgical History: 03/2002: BREAST CYST ASPIRATION 03/2004: BREAST CYST ASPIRATION     Comment:  left No date: CESAREAN SECTION No date: COLONOSCOPY 03/15/2018: KNEE ARTHROPLASTY; Left     Comment:  Procedure: COMPUTER ASSISTED TOTAL KNEE ARTHROPLASTY;       Surgeon: Dereck Leep, MD;  Location: [facility] ORS;                Service: Orthopedics;  Laterality: Left; No date: KNEE SURGERY     Comment:  left BMI    Body Mass Index:  33.31 kg/m     Reproductive/Obstetrics negative OB ROS                             Anesthesia Physical Anesthesia Plan  ASA: III  Anesthesia Plan: Spinal   Post-op Pain Management:    Induction:   PONV Risk Score and Plan:   Airway Management Planned:   Additional Equipment:   Intra-op Plan:   Post-operative Plan:   Informed Consent: I have reviewed the patients History and Physical, chart, labs and discussed the procedure including the risks, benefits and alternatives for the proposed anesthesia with the patient or authorized representative who has indicated his/her understanding and acceptance.     Dental Advisory Given  Plan Discussed with: CRNA  Anesthesia Plan Comments: (Hx of LBP noted.  Pt amenable to SAB and R/B discussed with patient.  JA)        Anesthesia Quick Evaluation

## 2018-07-29 NOTE — Evaluation (Signed)
Occupational Therapy Evaluation Patient Details Name: Michele Cruz MRN: [id number] DOB: 1955-09-03 Today's Date: 07/29/2018    History of Present Illness Pt is a 63 yo female with degenerative arthrosis of the right knee and is s/p elective right TKA. Hx L TKA in 02/2018.   Clinical Impression   Pt seen for OT evaluation this date, POD#1 from above surgery. Pt was independent in all ADL prior to surgery, however occasionally limited 2/2 R knee pain. Pt is eager to return to PLOF with less pain and improved safety and independence. Pt currently requires PRN minimal assist for LB dressing while in seated position due to pain and limited AROM of R knee. Pt instructed in polar care mgt, falls prevention strategies, home/routines modifications, DME/AE for LB bathing and dressing tasks, pet care considerations, and compression stocking mgt. Pt benefited maximally from this evaluation and treatment. Do not currently anticipate any OT needs following this hospitalization. No additional skilled OT needs identified at this time. Will sign off. Please re-consult if additional needs arise.      Follow Up Recommendations  No OT follow up    Equipment Recommendations  None recommended by OT    Recommendations for Other Services       Precautions / Restrictions Precautions Precautions: Knee;Fall Required Braces or Orthoses: Other Brace Other Brace: Pt able to perform Ind RLE SLR without extensor lag, no KI required Restrictions Weight Bearing Restrictions: Yes RLE Weight Bearing: Weight bearing as tolerated      Mobility Bed Mobility             General bed mobility comments: deferred, up in recliner  Transfers Overall transfer level: Needs assistance Equipment used: Rolling walker (2 wheeled) Transfers: Sit to/from Stand Sit to Stand: Min guard         General transfer comment: Good control and stability with transfers with min verbal cues for sequencing.      Balance Overall  balance assessment: No apparent balance deficits (not formally assessed)                                         ADL either performed or assessed with clinical judgement   ADL Overall ADL's : Needs assistance/impaired                                       General ADL Comments: CGA to PRN Min A for LB ADL tasks     Vision Baseline Vision/History: Wears glasses Wears Glasses: At all times Patient Visual Report: No change from baseline       Perception     Praxis      Pertinent Vitals/Pain Pain Assessment: 0-10 Pain Score: 2  Pain Location: R knee Pain Descriptors / Indicators: Aching;Sore Pain Intervention(s): Limited activity within patient's tolerance;Monitored during session     Hand Dominance Right   Extremity/Trunk Assessment Upper Extremity Assessment Upper Extremity Assessment: Overall WFL for tasks assessed   Lower Extremity Assessment Lower Extremity Assessment: Defer to PT evaluation;RLE deficits/detail RLE Deficits / Details: R hip flex >/= 3/5, Ind SLR without extensor lag RLE: Unable to fully assess due to pain RLE Sensation: WNL       Communication Communication Communication: No difficulties   Cognition Arousal/Alertness: Awake/alert Behavior During Therapy: WFL for tasks assessed/performed Overall Cognitive Status: Within  Functional Limits for tasks assessed                                     General Comments       Exercises Other Exercises Other Exercises: pt instructed in polar care mgt, compression stocking mgt, pet care considerations, falls prevention, AE/DME for ADL, and home/routine modifications to maximize safety/indep and minimize caregiver burden during recovery   Shoulder Instructions      Home Living Family/patient expects to be discharged to:: Private residence Living Arrangements: Spouse/significant other Available Help at Discharge: Family;Available 24 hours/day Type of  Home: House Home Access: Stairs to enter [employer] of Steps: 3 Entrance Stairs-Rails: None Home Layout: One level     Bathroom Shower/Tub: Teacher, early years/pre: Handicapped height     Home Equipment: Adaptive equipment;Grab bars - tub/shower Adaptive Equipment: Reacher        Prior Functioning/Environment Level of Independence: Independent        Comments: Pt Ind with amb community distances without an AD, active with hiking/walking, no fall history, Ind with all ADLs        OT Problem List: Decreased strength;Decreased range of motion;Pain      OT Treatment/Interventions:      OT Goals(Current goals can be found in the care plan section) Acute Rehab OT Goals Patient Stated Goal: To get back to walking and hiking OT Goal Formulation: All assessment and education complete, DC therapy  OT Frequency:     Barriers to D/C:            Co-evaluation              AM-PAC OT "6 Clicks" Daily Activity     Outcome Measure Help from another person eating meals?: None Help from another person taking care of personal grooming?: None Help from another person toileting, which includes using toliet, bedpan, or urinal?: A Little Help from another person bathing (including washing, rinsing, drying)?: A Little Help from another person to put on and taking off regular upper body clothing?: None Help from another person to put on and taking off regular lower body clothing?: A Little 6 Click Score: 21   End of Session    Activity Tolerance: Patient tolerated treatment well Patient left: in chair;with call bell/phone within reach;with chair alarm set  OT Visit Diagnosis: Other abnormalities of gait and mobility (R26.89);Pain Pain - Right/Left: Right Pain - part of body: Knee                Time: 9147-8295 OT Time Calculation (min): 32 min Charges:  OT General Charges $OT Visit: 1 Visit OT Evaluation $OT Eval Low Complexity: 1 Low OT  Treatments $Self Care/Home Management : 23-37 mins  Jeni Salles, MPH, MS, OTR/L ascom [phone] 07/29/18, 11:42 AM

## 2018-07-29 NOTE — Care Management (Signed)
Patient agreed to set up [facility]  Provided the [facility] choice list per CMS.gov Patient has used [facility] in the past and has chosen to use [facility] again [employer] of the choice

## 2018-07-29 NOTE — Progress Notes (Signed)
Physical Therapy Treatment Patient Details Name: Michele Cruz MRN: [id number] DOB: 1955/07/21 Today's Date: 07/29/2018    History of Present Illness Pt is a 63 yo female with degenerative arthrosis of the right knee and is s/p elective right TKA. Hx L TKA in 02/2018.    PT Comments    Pt presents with min deficits in strength, transfers, gait, R knee ROM, and activity tolerance but is progressing quickly towards goals.  Pt was SBA with transfers this session with very good control and stability.  Pt was able to amb 2 x 100 feet with a RW and improved cadence with step-through gait pattern.  Pt was steady ascending and descending 4 steps with B rails with only min verbal cues for sequencing.  Pt has no rails on her 3 steps to enter her home and after recent L TKA pt used crutches to ascend/descend her steps and reported feeling safe doing so.  Pt would benefit stair training with crutches next session.  Pt will benefit from HHPT services upon discharge to safely address above deficits for decreased caregiver assistance and eventual return to PLOF.      Follow Up Recommendations  Home health PT     Equipment Recommendations  None recommended by PT    Recommendations for Other Services       Precautions / Restrictions Precautions Precautions: Knee;Fall Required Braces or Orthoses: Other Brace Other Brace: Pt able to perform Ind RLE SLR without extensor lag, no KI required Restrictions Weight Bearing Restrictions: Yes RLE Weight Bearing: Weight bearing as tolerated    Mobility  Bed Mobility Overal bed mobility: Modified Independent             General bed mobility comments: NT, pt up in recliner  Transfers Overall transfer level: Needs assistance Equipment used: Rolling walker (2 wheeled) Transfers: Sit to/from Stand Sit to Stand: Supervision         General transfer comment: Good control and stability with transfers with min verbal cues for sequencing.     Ambulation/Gait Ambulation/Gait assistance: Min guard Gait Distance (Feet): 100 Feet x 2 Assistive device: Rolling walker (2 wheeled) Gait Pattern/deviations: Step-through pattern;Decreased stance time - right Gait velocity: Decreased   General Gait Details: Moderate cadence with decreased RLE stance time but steady without LOB   Stairs Stairs: Yes Stairs assistance: Min guard Stair Management: Two rails Number of Stairs: 4 General stair comments: Good eccentric and concentric control ascending and descending steps with B rails and min cues for sequencing   Wheelchair Mobility    Modified Rankin (Stroke Patients Only)       Balance Overall balance assessment: No apparent balance deficits (not formally assessed)                                          Cognition Arousal/Alertness: Awake/alert Behavior During Therapy: WFL for tasks assessed/performed Overall Cognitive Status: Within Functional Limits for tasks assessed                                        Exercises Total Joint Exercises Ankle Circles/Pumps: AROM;Both;10 reps Quad Sets: Strengthening;AROM;Both;10 reps Gluteal Sets: Strengthening;Both;10 reps Straight Leg Raises: AROM;Both;10 reps Long Arc Quad: AROM;Strengthening;10 reps;15 reps;Both Knee Flexion: AROM;Strengthening;Both;10 reps;15 reps Goniometric ROM: R knee AROM: 3-78 deg Marching in Standing:  AROM;Both;10 reps;Standing Other Exercises Other Exercises: Stair training with cues for proper sequencing Other Exercises: HEP education and review per handout Other Exercises: 90 deg R turn training to prevent CKC twisting on the R knee Other Exercises: pt instructed in polar care mgt, compression stocking mgt, pet care considerations, falls prevention, AE/DME for ADL, and home/routine modifications to maximize safety/indep and minimize caregiver burden during recovery    General Comments        Pertinent  Vitals/Pain Pain Assessment: 0-10 Pain Score: 3  Pain Location: R knee Pain Descriptors / Indicators: Aching;Sore Pain Intervention(s): Monitored during session;Premedicated before session    [place] expects to be discharged to:: Private residence Living Arrangements: Spouse/significant other Available Help at Discharge: Family;Available 24 hours/day Type of Home: House Home Access: Stairs to enter Entrance Stairs-Rails: None Home Layout: One level Home Equipment: Adaptive equipment;Grab bars - tub/shower      Prior Function Level of Independence: Independent      Comments: Pt Ind with amb community distances without an AD, active with hiking/walking, no fall history, Ind with all ADLs   PT Goals (current goals can now be found in the care plan section) Acute Rehab PT Goals Patient Stated Goal: To get back to walking and hiking PT Goal Formulation: With patient Time For Goal Achievement: 08/11/18 Potential to Achieve Goals: Good Progress towards PT goals: Progressing toward goals    Frequency    BID      PT Plan Current plan remains appropriate    Co-evaluation              AM-PAC PT "6 Clicks" Mobility   Outcome Measure  Help needed turning from your back to your side while in a flat bed without using bedrails?: None Help needed moving from lying on your back to sitting on the side of a flat bed without using bedrails?: None Help needed moving to and from a bed to a chair (including a wheelchair)?: A Little Help needed standing up from a chair using your arms (e.g., wheelchair or bedside chair)?: A Little Help needed to walk in hospital room?: A Little Help needed climbing 3-5 steps with a railing? : A Little 6 Click Score: 20    End of Session Equipment Utilized During Treatment: Gait belt Activity Tolerance: Patient tolerated treatment well Patient left: in chair;with call bell/phone within reach;with chair alarm set;with SCD's  reapplied;Other (comment)(Polar care to R knee) Nurse Communication: Mobility status PT Visit Diagnosis: Muscle weakness (generalized) (M62.81);Other abnormalities of gait and mobility (R26.89);Pain Pain - Right/Left: Right Pain - part of body: Knee     Time: 1325-1355 PT Time Calculation (min) (ACUTE ONLY): 30 min  Charges:  $Gait Training: 8-22 mins $Therapeutic Exercise: 8-22 mins $Therapeutic Activity: 8-22 mins                     D. Scott Evania Lyne PT, DPT 07/29/18, 2:10 PM

## 2018-07-29 NOTE — Progress Notes (Signed)
Clinical Social Worker (CSW) received SNF consult. PT is recommending home health. RN case manager aware of above. Please reconsult if future social work needs arise. CSW signing off.   Jaylin Benzel, LCSW [phone] 

## 2018-07-29 NOTE — NC FL2 (Signed)
[place] LEVEL OF CARE SCREENING TOOL     IDENTIFICATION  Patient Name: Michele Cruz Birthdate: [birth year] Sex: female Admission Date (Current Location): 07/28/2018  [place] and [place] Number:  Engineering geologist and Address:  [facility], [address] [place], [place] [address]      Provider Number: [id number]  Attending Physician Name and Address:  Dereck Leep, MD  Relative Name and Phone Number:       Current Level of Care: Hospital Recommended Level of Care: [place] Prior Approval Number:    Date Approved/Denied:   PASRR Number: [id number])  Discharge Plan: SNF    Current Diagnoses: Patient Active Problem List   Diagnosis Date Noted  . Total knee replacement status 07/28/2018  . 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  . Internal hemorrhoids 07/21/2017  . Hydradenitis 07/21/2017  . Obesity (BMI 30-39.9) 12/09/2016  . Encounter for routine gynecological examination 01/10/2014  . Primary osteoarthritis of right knee 06/21/2012  . Screening mammogram, encounter for 04/07/2011  . Routine general medical examination at a health care facility 03/25/2011  . HYPERCHOLESTEROLEMIA 12/11/2009  . COLONIC POLYPS, HX OF 08/03/2009  . HERNIATED CERVICAL DISC 06/20/2009  . Herniated cervical disc 06/20/2009  . BACK PAIN, LUMBAR, WITH RADICULOPATHY 10/13/2008  . Generalized anxiety disorder 05/24/2007  . ALLERGIC RHINITIS 05/24/2007  . ROSACEA 05/24/2007  . CARDIAC MURMUR 05/24/2007    Orientation RESPIRATION BLADDER Height & Weight     Self, Time, Situation, Place  Normal Continent Weight: 209 lb 8 oz (95 kg) Height:  5' 6.5" (168.9 cm)  BEHAVIORAL SYMPTOMS/MOOD NEUROLOGICAL BOWEL NUTRITION STATUS      Continent Diet(Diet: Regular )  AMBULATORY STATUS COMMUNICATION OF NEEDS Skin   Extensive Assist Verbally Surgical wounds(Incision: Right Knee. )                       Personal Care  Assistance Level of Assistance  Bathing, Feeding, Dressing Bathing Assistance: Limited assistance Feeding assistance: Independent Dressing Assistance: Limited assistance     Functional Limitations Info  Sight, Hearing, Speech Sight Info: Adequate Hearing Info: Adequate Speech Info: Adequate    SPECIAL CARE FACTORS FREQUENCY  PT (By licensed PT), OT (By licensed OT)     PT Frequency: (5) OT Frequency: (5)            Contractures      Additional Factors Info  Code Status, Allergies Code Status Info: (Full Code. ) Allergies Info: (Decadron Dexamethasone, Depo-medrol Methylprednisolone Acetate, Lipitor Atorvastatin Calcium, Meloxicam, Simvastatin)           Current Medications (07/29/2018):  This is the current hospital active medication list Current Facility-Administered Medications  Medication Dose Route Frequency Provider Last Rate Last Dose  . 0.9 %  sodium chloride infusion   Intravenous Continuous Hooten, Laurice Record, MD   Stopped at 07/29/18 [phone]  . acetaminophen (OFIRMEV) IV 1,000 mg  1,000 mg Intravenous Q6H Hooten, Laurice Record, MD 400 mL/hr at 07/29/18 0505    . acetaminophen (TYLENOL) tablet 325 mg  325 mg Oral Q6H PRN Hooten, Laurice Record, MD      . alum & mag hydroxide-simeth (MAALOX/MYLANTA) 200-200-20 MG/5ML suspension 30 mL  30 mL Oral Q4H PRN Hooten, Laurice Record, MD      . artificial tears (LACRILUBE) ophthalmic ointment   Both Eyes BID PRN Hooten, Laurice Record, MD      . bisacodyl (DULCOLAX) suppository 10 mg  10 mg Rectal  Daily PRN Hooten, Laurice Record, MD      . ceFAZolin (ANCEF) IVPB 2g/100 mL premix  2 g Intravenous Q6H Hooten, Laurice Record, MD 200 mL/hr at 07/29/18 0855 2 g at 07/29/18 0855  . celecoxib (CELEBREX) capsule 200 mg  200 mg Oral BID Dereck Leep, MD   200 mg at 07/29/18 0848  . diphenhydrAMINE (BENADRYL) 12.5 MG/5ML elixir 12.5-25 mg  12.5-25 mg Oral Q4H PRN Hooten, Laurice Record, MD      . enoxaparin (LOVENOX) injection 30 mg  30 mg Subcutaneous Q12H Hooten, Laurice Record, MD   30  mg at 07/29/18 0849  . ferrous sulfate tablet 325 mg  325 mg Oral BID WC Hooten, Laurice Record, MD   325 mg at 07/29/18 0849  . Fluocinolone Acetonide 0.01 % OIL 1 drop  1 drop Both EARS Weekly Hooten, Laurice Record, MD      . gabapentin (NEURONTIN) capsule 300 mg  300 mg Oral QHS Hooten, Laurice Record, MD   300 mg at 07/28/18 2157  . HYDROmorphone (DILAUDID) injection 0.5 mg  0.5 mg Intravenous Q4H PRN Hooten, Laurice Record, MD      . Ivermectin 1 % CREA 1 application  1 application Topical Daily PRN Hooten, Laurice Record, MD      . loratadine (CLARITIN) tablet 10 mg  10 mg Oral Daily Hooten, Laurice Record, MD   10 mg at 07/29/18 0849  . magnesium hydroxide (MILK OF MAGNESIA) suspension 30 mL  30 mL Oral Daily Hooten, Laurice Record, MD   30 mL at 07/29/18 0849  . menthol-cetylpyridinium (CEPACOL) lozenge 3 mg  1 lozenge Oral PRN Hooten, Laurice Record, MD       Or  . phenol (CHLORASEPTIC) mouth spray 1 spray  1 spray Mouth/Throat PRN Hooten, Laurice Record, MD      . metoCLOPramide (REGLAN) tablet 5 mg  5 mg Oral Q8H PRN Hooten, Laurice Record, MD       Or  . metoCLOPramide (REGLAN) injection 5 mg  5 mg Intravenous Q8H PRN Hooten, Laurice Record, MD      . metoCLOPramide (REGLAN) tablet 10 mg  10 mg Oral TID AC & HS Hooten, Laurice Record, MD   10 mg at 07/29/18 0849  . omega-3 acid ethyl esters (LOVAZA) capsule 1 g  1 g Oral Daily Hooten, Laurice Record, MD      . ondansetron (ZOFRAN) tablet 4 mg  4 mg Oral Q6H PRN Hooten, Laurice Record, MD       Or  . ondansetron (ZOFRAN) injection 4 mg  4 mg Intravenous Q6H PRN Hooten, Laurice Record, MD      . oxyCODONE (Oxy IR/ROXICODONE) immediate release tablet 10 mg  10 mg Oral Q4H PRN Hooten, Laurice Record, MD      . oxyCODONE (Oxy IR/ROXICODONE) immediate release tablet 5 mg  5 mg Oral Q4H PRN Hooten, Laurice Record, MD      . pantoprazole (PROTONIX) EC tablet 40 mg  40 mg Oral BID Dereck Leep, MD   40 mg at 07/29/18 0849  . pneumococcal 23 valent vaccine (PNU-IMMUNE) injection 0.5 mL  0.5 mL Intramuscular Tomorrow-1000 Hooten, Laurice Record, MD      .  rosuvastatin (CRESTOR) tablet 5 mg  5 mg Oral QHS Hooten, Laurice Record, MD   5 mg at 07/28/18 2158  . senna-docusate (Senokot-S) tablet 1 tablet  1 tablet Oral BID Hooten, Laurice Record, MD   1 tablet at 07/29/18 0848  . sodium phosphate (FLEET) 7-19  GM/118ML enema 1 enema  1 enema Rectal Once PRN Hooten, Laurice Record, MD      . traMADol Veatrice Bourbon) tablet 50 mg  50 mg Oral Q4H PRN Dereck Leep, MD   50 mg at 07/29/18 0459     Discharge Medications: Please see discharge summary for a list of discharge medications.  Relevant Imaging Results:  Relevant Lab Results:   Additional Information (SSN: [id number])  Eamonn Sermeno, Veronia Beets, LCSW

## 2018-07-29 NOTE — Discharge Instructions (Signed)
°  Instructions after Total Knee Replacement ° ° James P. Hooten, Jr., M.D.    ° Dept. of Orthopaedics & Sports Medicine ° [facility] ° [address] ° [place], [place]  [address] ° Phone: [phone]   Fax: [phone] ° °  °DIET: °• Drink plenty of non-alcoholic fluids. °• Resume your normal diet. Include foods high in fiber. ° °ACTIVITY:  °• You may use crutches or a walker with weight-bearing as tolerated, unless instructed otherwise. °• You may be weaned off of the walker or crutches by your Physical Therapist.  °• Do NOT place pillows under the knee. Anything placed under the knee could limit your ability to straighten the knee.   °• Continue doing gentle exercises. Exercising will reduce the pain and swelling, increase motion, and prevent muscle weakness.   °• Please continue to use the TED compression stockings for 6 weeks. You may remove the stockings at night, but should reapply them in the morning. °• Do not drive or operate any equipment until instructed. ° °WOUND CARE:  °• Continue to use the PolarCare or ice packs periodically to reduce pain and swelling. °• You may bathe or shower after the staples are removed at the first office visit following surgery. ° °MEDICATIONS: °• You may resume your regular medications. °• Please take the pain medication as prescribed on the medication. °• Do not take pain medication on an empty stomach. °• You have been given a prescription for a blood thinner (Lovenox or Coumadin). Please take the medication as instructed. (NOTE: After completing a 2 week course of Lovenox, take one Enteric-coated aspirin once a day. This along with elevation will help reduce the possibility of phlebitis in your operated leg.) °• Do not drive or drink alcoholic beverages when taking pain medications. ° °CALL THE OFFICE FOR: °• Temperature above 101 degrees °• Excessive bleeding or drainage on the dressing. °• Excessive swelling, coldness, or paleness of the toes. °• Persistent  nausea and vomiting. ° °FOLLOW-UP:  °• You should have an appointment to return to the office in 10-14 days after surgery. °• Arrangements have been made for continuation of Physical Therapy (either home therapy or outpatient therapy). °  °

## 2018-07-29 NOTE — Anesthesia Postprocedure Evaluation (Signed)
Anesthesia Post Note  Patient: Michele Cruz  Procedure(s) Performed: COMPUTER ASSISTED TOTAL KNEE ARTHROPLASTY (Right Knee)  Anesthesia Type: Spinal     Last Vitals:  Vitals:   07/28/18 2334 07/29/18 0341  BP: 110/62 (!) 106/56  Pulse: 81 66  Resp: 17 16  Temp: 36.7 C 36.6 C  SpO2: 98% 98%    Last Pain:  Vitals:   07/29/18 0551  TempSrc:   PainSc: 1                  Christepher Melchior,  Regenia Skeeter

## 2018-07-29 NOTE — Care Management Note (Signed)
Case Management Note  Patient Details  Name: Keelin Neville MRN: [id number] Date of Birth: 27-Mar-1956  Subjective/Objective:                   Met with the patient to discuss DC plan Patient has a RW and all equipment at home, no DME needs Plans to do Outpatient PT, already set up appointment I gave the [facility] choice list in case things change and my contact information Action/Plan: [facility] list provided per CMS.gov  Expected Discharge Date:  07/30/18               Expected Discharge Plan:     In-House Referral:     Discharge planning Services  CM Consult  Post Acute Care Choice:    Choice offered to:     DME Arranged:    DME Agency:     HH Arranged:    HH Agency:     Status of Service:  Completed, signed off  If discussed at [employer] of Stay Meetings, dates discussed:    Additional Comments:  Su Hilt, RN 07/29/2018, 10:38 AM

## 2018-07-29 NOTE — Evaluation (Addendum)
Physical Therapy Evaluation Patient Details Name: Michele Cruz MRN: [id number] DOB: 03/28/1956 Today's Date: 07/29/2018   History of Present Illness  Pt is a 63 yo female with degenerative arthrosis of the right knee and is s/p elective right TKA.   Clinical Impression  Pt presents with minor deficits in strength, transfers, mobility, gait, R knee ROM, and activity tolerance but overall performed very well during the session.  Pt was Mod Ind with bed mobility tasks and CGA with transfers with min verbal cues for sequencing.  Pt was steady with amb with a RW with only minimal increase in R knee pain from 2/10 to 3/10.  Pt was able to amb 80 feet with step-through pattern with only mildly antalgic pattern on the RLE.  Pt will benefit from HHPT services upon discharge to safely address above deficits for decreased caregiver assistance and eventual return to PLOF.      Follow Up Recommendations Home health PT    Equipment Recommendations  None recommended by PT    Recommendations for Other Services       Precautions / Restrictions Precautions Precautions: Knee;Fall Required Braces or Orthoses: Other Brace Other Brace: Pt able to perform Ind RLE SLR without extensor lag, no KI required Restrictions Weight Bearing Restrictions: Yes RLE Weight Bearing: Weight bearing as tolerated      Mobility  Bed Mobility Overal bed mobility: Modified Independent             General bed mobility comments: Good speed and effort with bed mobility tasks with use of the bed rail  Transfers Overall transfer level: Needs assistance Equipment used: Rolling walker (2 wheeled) Transfers: Sit to/from Stand Sit to Stand: Min guard         General transfer comment: Good control and stability with transfers with min verbal cues for sequencing.    Ambulation/Gait Ambulation/Gait assistance: Min guard Gait Distance (Feet): 80 Feet Assistive device: Rolling walker (2 wheeled) Gait  Pattern/deviations: Step-through pattern;Decreased stance time - right Gait velocity: Decreased   General Gait Details: Slow cadence with decreased RLE stance time but steady without LOB  Stairs            Wheelchair Mobility    Modified Rankin (Stroke Patients Only)       Balance Overall balance assessment: No apparent balance deficits (not formally assessed)                                           Pertinent Vitals/Pain Pain Assessment: 0-10 Pain Score: 2  Pain Location: R knee Pain Descriptors / Indicators: Aching;Sore Pain Intervention(s): Monitored during session;Premedicated before session    [place] lives in a 1 story home with her spouse with 24/7 assist available, 3 steps to enter without rails.                    Prior Function Level of Independence: Independent         Comments: Pt Ind with amb community distances without an AD, active with hiking/walking, no fall history, Ind with all ADLs     Hand Dominance   Dominant Hand: Right    Extremity/Trunk Assessment   Upper Extremity Assessment Upper Extremity Assessment: Overall WFL for tasks assessed    Lower Extremity Assessment Lower Extremity Assessment: Generalized weakness;RLE deficits/detail RLE Deficits / Details: R hip flex >/= 3/5, Ind  SLR without extensor lag RLE: Unable to fully assess due to pain RLE Sensation: WNL       Communication   Communication: No difficulties  Cognition Arousal/Alertness: Awake/alert Behavior During Therapy: WFL for tasks assessed/performed Overall Cognitive Status: Within Functional Limits for tasks assessed                                        General Comments      Exercises Total Joint Exercises Ankle Circles/Pumps: AROM;Both;10 reps Quad Sets: Strengthening;AROM;Both;10 reps Gluteal Sets: Strengthening;Both;10 reps Straight Leg Raises: AROM;Both;10 reps Long Arc Quad:  AROM;Strengthening;10 reps;15 reps;Both Knee Flexion: AROM;Strengthening;Both;10 reps;15 reps Goniometric ROM: R knee AROM: 3-78 deg Marching in Standing: AROM;Both;10 reps;Standing Other Exercises Other Exercises: Positioning education provided to encourage R knee ext PROM Other Exercises: HEP education and review per handout Other Exercises: 90 deg R turn training to prevent CKC twisting on the R knee   Assessment/Plan    PT Assessment Patient needs continued PT services  PT Problem List Decreased strength;Decreased range of motion;Decreased activity tolerance;Decreased mobility;Decreased knowledge of use of DME       PT Treatment Interventions DME instruction;Gait training;Stair training;Functional mobility training;Therapeutic activities;Therapeutic exercise;Balance training;Patient/family education    PT Goals (Current goals can be found in the Care Plan section)  Acute Rehab PT Goals Patient Stated Goal: To get back to walking and hiking PT Goal Formulation: With patient Time For Goal Achievement: 08/11/18 Potential to Achieve Goals: Good    Frequency BID   Barriers to discharge        Co-evaluation               AM-PAC PT "6 Clicks" Mobility  Outcome Measure Help needed turning from your back to your side while in a flat bed without using bedrails?: None Help needed moving from lying on your back to sitting on the side of a flat bed without using bedrails?: None Help needed moving to and from a bed to a chair (including a wheelchair)?: A Little Help needed standing up from a chair using your arms (e.g., wheelchair or bedside chair)?: A Little Help needed to walk in hospital room?: A Little Help needed climbing 3-5 steps with a railing? : A Little 6 Click Score: 20    End of Session Equipment Utilized During Treatment: Gait belt Activity Tolerance: Patient tolerated treatment well Patient left: in chair;with call bell/phone within reach;with chair alarm  set;with SCD's reapplied;Other (comment)(Polar care donned to R knee) Nurse Communication: Mobility status PT Visit Diagnosis: Muscle weakness (generalized) (M62.81);Other abnormalities of gait and mobility (R26.89);Pain Pain - Right/Left: Right Pain - part of body: Knee    Time: 7989-2119 PT Time Calculation (min) (ACUTE ONLY): 54 min   Charges:   PT Evaluation $PT Eval Low Complexity: 1 Low PT Treatments $Gait Training: 8-22 mins $Therapeutic Exercise: 8-22 mins $Therapeutic Activity: 8-22 mins       D. Scott Edric Fetterman PT, DPT 07/29/18, 11:37 AM

## 2018-07-29 NOTE — Progress Notes (Signed)
  Subjective: 1 Day Post-Op Procedure(s) (LRB): COMPUTER ASSISTED TOTAL KNEE ARTHROPLASTY (Right) Patient reports pain as mild.   Patient seen in rounds with Dr. Marry Guan. Patient is well, and has had no acute complaints or problems Plan is to go Home after hospital stay. Negative for chest pain and shortness of breath Fever: no Gastrointestinal: Negative for nausea and vomiting  Objective: Vital signs in last 24 hours: Temp:  [97.6 F (36.4 C)-98.7 F (37.1 C)] 97.9 F (36.6 C) (02/06 0341) Pulse Rate:  [66-104] 66 (02/06 0341) Resp:  [14-22] 16 (02/06 0341) BP: (104-122)/(56-82) 106/56 (02/06 0341) SpO2:  [95 %-100 %] 98 % (02/06 0341) Weight:  [95 kg] 95 kg (02/05 0630)  Intake/Output from previous day:  Intake/Output Summary (Last 24 hours) at 07/29/2018 0559 Last data filed at 07/29/2018 0550 Gross per 24 hour  Intake 3522.1 ml  Output 3315 ml  Net 207.1 ml    Intake/Output this shift: Total I/O In: 1172.5 [I.V.:833.9; IV Piggyback:338.6] Out: 7416 [Urine:1275; Drains:170]  Labs: No results for input(s): HGB in the last 72 hours. No results for input(s): WBC, RBC, HCT, PLT in the last 72 hours. No results for input(s): NA, K, CL, CO2, BUN, CREATININE, GLUCOSE, CALCIUM in the last 72 hours. No results for input(s): LABPT, INR in the last 72 hours.   EXAM General - Patient is Alert and Oriented Extremity - Neurovascular intact Sensation intact distally Dorsiflexion/Plantar flexion intact Compartment soft Dressing/Incision - clean, dry, no drainage, with the Hemovac intact Motor Function - intact, moving foot and toes well on exam.  Able to straight leg raise independently  Past Medical History:  Diagnosis Date  . Allergic rhinitis   . Anemia, iron deficiency   . Anxiety   . Arthritis   . Asthma    no symptoms last 25 years  . Colon polyps   . Family history of adverse reaction to anesthesia    sister PONV  . Heavy menses   . Hemorrhoids   . Hidradenitis  suppurativa   . History of exercise stress test 06/1999   neg  . History of MRI 2003   neg per patient  . History of MRI 2007   right quad fatty defect  . History of MRI of spine 03/2004   C-5, C5-6 disk protrusion  . Hyperlipidemia    LDL  . Rosacea   . Spondylosis    deg lumbar disc dz  . Wisdom teeth removed   . Zoster     Assessment/Plan: 1 Day Post-Op Procedure(s) (LRB): COMPUTER ASSISTED TOTAL KNEE ARTHROPLASTY (Right) Active Problems:   Total knee replacement status  Estimated body mass index is 33.31 kg/m as calculated from the following:   Height as of this encounter: 5' 6.5" (1.689 m).   Weight as of this encounter: 95 kg. Advance diet Up with therapy D/C IV fluids Discharge home with home health tomorrow. Follow-up [facility] in 2 weeks for staple removal.  DVT Prophylaxis - Lovenox, Foot Pumps and TED hose Weight-Bearing as tolerated to right leg  Reche Dixon, PA-C Orthopaedic Surgery 07/29/2018, 5:59 AM

## 2018-07-30 MED ORDER — TRAMADOL HCL 50 MG PO TABS: 50.0000 mg | ORAL_TABLET | ORAL | 1 refills | Status: DC | PRN

## 2018-07-30 MED ORDER — CELECOXIB 200 MG PO CAPS: 200.0000 mg | ORAL_CAPSULE | Freq: Two times a day (BID) | ORAL | 1 refills | Status: DC

## 2018-07-30 MED ORDER — OXYCODONE HCL 5 MG PO TABS: 5.0000 mg | ORAL_TABLET | ORAL | 0 refills | Status: DC | PRN

## 2018-07-30 MED ORDER — ENOXAPARIN SODIUM 40 MG/0.4ML ~~LOC~~ SOLN: 40.0000 mg | SUBCUTANEOUS | 0 refills | Status: DC

## 2018-07-30 NOTE — Care Management (Addendum)
RNCM contacted [facility] for price on Lovenox but they did not have it on file- [phone].  Helene Kelp with [facility] notified of patient discharge. RNCM spoke with patient and updated on Lovenox.  She mentioned that she "has already arranged for outpatient PT on Monday, Wednesday and Friday at [facility].  She asked that I keep home health in place so that she can be seen over the weekend- assuming [facility] has already received authorization for visits.  Update: Lovenox $5. Patient notified and agrees. No other RNCM needs. Confirmed with Helene Kelp at [facility] that they can see patient this weekend.

## 2018-07-30 NOTE — Progress Notes (Signed)
Discharge instructions and printed instructions given to patient with verbalization of understanding. Patient's husband was at the bedside. IV discontinued without problems. Patient packed up and wheeled out via nursing.

## 2018-07-30 NOTE — Progress Notes (Signed)
Physical Therapy Treatment Patient Details Name: Michele Cruz MRN: [id number] DOB: 1955/09/04 Today's Date: 07/30/2018    History of Present Illness Pt is a 63 yo female with degenerative arthrosis of the right knee and is s/p elective right TKA. Hx L TKA in 02/2018.    PT Comments    Pt able to progress to navigating 4 stairs with B axillary crutches safely with CGA; also able to ambulate around nursing loop with RW with SBA safely.  Overall steady and safe with functional mobility during session using appropriate DME.  R knee flexion ROM to 95 degrees.  Plan for discharge home today.  Pt reporting having questions regarding PT upon hospital discharge (pt reports she has OP PT already set-up for next week); CM notified and reported she would come talk to pt today regarding physical therapy upon hospital discharge.   Follow Up Recommendations  Follow surgeon's recommendation for DC plan and follow-up therapies     Equipment Recommendations  Rolling walker with 5" wheels    Recommendations for Other Services       Precautions / Restrictions Precautions Precautions: Knee;Fall Precaution Booklet Issued: Yes (comment) Required Braces or Orthoses: Knee Immobilizer - Right Knee Immobilizer - Right: Discontinue once straight leg raise with < 10 degree lag Restrictions Weight Bearing Restrictions: Yes RLE Weight Bearing: Weight bearing as tolerated    Mobility  Bed Mobility Overal bed mobility: Modified Independent             General bed mobility comments: Semi-supine to/from/to sit with HOB elevated without any noted difficulties  Transfers Overall transfer level: Modified independent Equipment used: Rolling walker (2 wheeled) Transfers: Sit to/from [facility] Sit to Stand: Modified independent (Device/Increase time) Stand pivot transfers: Modified independent (Device/Increase time)       General transfer comment: steady safe transfers noted with RW  use  Ambulation/Gait Ambulation/Gait assistance: Min guard;Supervision Gait Distance (Feet): (140 feet; 180 feet) Assistive device: Rolling walker (2 wheeled) Gait Pattern/deviations: Decreased stance time - right;Antalgic Gait velocity: mildly decreased   General Gait Details: partial step through gait pattern; good knee flexion during R LE swing phase and good B heelstrike; steady with RW use   Stairs Stairs: Yes Stairs assistance: Min guard Stair Management: With crutches;Step to pattern;Forwards Number of Stairs: 4 General stair comments: initial demo and vc's for stairs navigation; steady with B axillary crutch use   Wheelchair Mobility    Modified Rankin (Stroke Patients Only)       Balance Overall balance assessment: Needs assistance Sitting-balance support: No upper extremity supported;Feet supported Sitting balance-Leahy Scale: Normal Sitting balance - Comments: steady sitting reaching outside BOS   Standing balance support: No upper extremity supported Standing balance-Leahy Scale: Good Standing balance comment: steady standing reaching within BOS                            Cognition Arousal/Alertness: Awake/alert Behavior During Therapy: WFL for tasks assessed/performed Overall Cognitive Status: Within Functional Limits for tasks assessed                                        Exercises Total Joint Exercises Ankle Circles/Pumps: AROM;Strengthening;Both;10 reps;Supine Quad Sets: AROM;Strengthening;Both;10 reps;Supine Short Arc Quad: AROM;Strengthening;Right;10 reps;Supine Heel Slides: AROM;Strengthening;Right;10 reps;Supine Hip ABduction/ADduction: AROM;Strengthening;Right;10 reps;Supine Straight Leg Raises: AROM;Strengthening;Right;10 reps;Supine Goniometric ROM: R knee extension AROM 3 degrees short of  neutral semi-supine in bed; R knee flexion AAROM 95 degrees sitting edge of recliner    General Comments General comments  (skin integrity, edema, etc.): Mild drainage noted R knee dressing.  Pt agreeable to PT session.      Pertinent Vitals/Pain Pain Assessment: 0-10 Pain Score: 1  Pain Location: R knee Pain Descriptors / Indicators: Sore Pain Intervention(s): Limited activity within patient's tolerance;Monitored during session;Premedicated before session;Repositioned;Other (comment)(polar care applied)  Vitals (HR and O2 on room air) stable and WFL throughout treatment session.    Home Living                      Prior Function            PT Goals (current goals can now be found in the care plan section) Acute Rehab PT Goals Patient Stated Goal: To get back to walking and hiking PT Goal Formulation: With patient Time For Goal Achievement: 08/11/18 Potential to Achieve Goals: Good Progress towards PT goals: Progressing toward goals    Frequency    BID      PT Plan Discharge plan needs to be updated    Co-evaluation              AM-PAC PT "6 Clicks" Mobility   Outcome Measure  Help needed turning from your back to your side while in a flat bed without using bedrails?: None Help needed moving from lying on your back to sitting on the side of a flat bed without using bedrails?: None Help needed moving to and from a bed to a chair (including a wheelchair)?: None Help needed standing up from a chair using your arms (e.g., wheelchair or bedside chair)?: None Help needed to walk in hospital room?: A Little Help needed climbing 3-5 steps with a railing? : A Little 6 Click Score: 22    End of Session Equipment Utilized During Treatment: Gait belt Activity Tolerance: Patient tolerated treatment well Patient left: in chair;with call bell/phone within reach;with chair alarm set;with SCD's reapplied;Other (comment)(R heel elevated via towel roll) Nurse Communication: Mobility status;Precautions;Weight bearing status PT Visit Diagnosis: Muscle weakness (generalized) (M62.81);Other  abnormalities of gait and mobility (R26.89);Pain Pain - Right/Left: Right Pain - part of body: Knee     Time: 6812-7517 PT Time Calculation (min) (ACUTE ONLY): 48 min  Charges:  $Gait Training: 8-22 mins $Therapeutic Exercise: 8-22 mins $Therapeutic Activity: 8-22 mins                     Leitha Bleak, PT 07/30/18, 10:12 AM [phone]

## 2018-07-30 NOTE — Discharge Summary (Signed)
Physician Discharge Summary  Subjective: 2 Days Post-Op Procedure(s) (LRB): COMPUTER ASSISTED TOTAL KNEE ARTHROPLASTY (Right) Patient reports pain as mild.   Patient seen in rounds with Dr. Marry Guan. Patient is well, and has had no acute complaints or problems Patient is ready to go home with home health physical therapy.  Physician Discharge Summary  Patient ID: Michele Cruz MRN: [id number] DOB/AGE: Dec 22, 1955 63 y.o.  Admit date: 07/28/2018 Discharge date: 07/30/2018  Admission Diagnoses:  Discharge Diagnoses:  Active Problems:   Total knee replacement status   Discharged Condition: good  Hospital Course: The patient is postop day 2 from a right total knee replacement.  She has done well with physical therapy.  She is ambulated 200 feet and has done stairs.  She has not had a bowel movement but is working on this.  Her vitals have remained stable.  Treatments: surgery:   Right total knee arthroplasty using computer-assisted navigation  SURGEON:  Marciano Sequin. M.D.  ASSISTANT: Melissa Noon, PA-C (present and scrubbed throughout the case, critical for assistance with exposure, retraction, instrumentation, and closure)  ANESTHESIA: spinal  ESTIMATED BLOOD LOSS: 50 mL  FLUIDS REPLACED: 1200 mL of crystalloid  TOURNIQUET TIME: 102 minutes  DRAINS: 2 medium Hemovac drains  SOFT TISSUE RELEASES: Anterior cruciate ligament, posterior cruciate ligament, deep medial collateral ligament, patellofemoral ligament  IMPLANTS UTILIZED: DePuy Attune size 8 posterior stabilized femoral component (cemented), size 6 rotating platform tibial component (cemented), 41 mm medialized dome patella (cemented), and a 5 mm stabilized rotating platform polyethylene insert.  Discharge Exam: Blood pressure (!) 124/59, pulse 78, temperature (!) 97.4 F (36.3 C), temperature source Oral, resp. rate 16, height 5' 6.5" (1.689 m), weight 95 kg, SpO2 97 %.   Disposition: Discharge  disposition: 01-Home or Self Care        Allergies as of 07/30/2018      Reactions   Decadron [dexamethasone] Other (See Comments)   Unsure if related---swelling of throat   Depo-medrol [methylprednisolone Acetate]    Rash after injection, a noted common SE from depo-medrol listed in epocrates and most resources   Lipitor [atorvastatin Calcium] Other (See Comments)   Leg cramps and pain   Meloxicam    Pt felt dizzy potentially from this, but does well on aleve   Simvastatin    Possible transient memory changes, resolved off medicine. Muscle pain      Medication List    STOP taking these medications   naproxen sodium 220 MG tablet Commonly known as:  ALEVE     TAKE these medications   celecoxib 200 MG capsule Commonly known as:  CELEBREX Take 1 capsule (200 mg total) by mouth 2 (two) times daily.   COLLAGEN PO Take 4 capsules by mouth daily. Collagen 2 - 600 mg per capsule   enoxaparin 40 MG/0.4ML injection Commonly known as:  LOVENOX Inject 0.4 mLs (40 mg total) into the skin daily.   Fish Oil 1200 MG Caps Take 1,200 mg by mouth daily.   Fluocinolone Acetonide 0.01 % Oil Place 1 drop into both ears once a week.   loratadine 10 MG tablet Commonly known as:  CLARITIN Take 10 mg by mouth daily.   LUBRICATING EYE DROPS OP Place 1 drop into both eyes 2 (two) times daily as needed (dry eyes).   Magnesium Oxide 250 MG Tabs Take 250 mg by mouth daily.   oxyCODONE 5 MG immediate release tablet Commonly known as:  Oxy IR/ROXICODONE Take 1 tablet (5 mg total) by mouth  every 4 (four) hours as needed for moderate pain (pain score 4-6).   rosuvastatin 5 MG tablet Commonly known as:  CRESTOR Take 1 tablet (5 mg total) by mouth daily. What changed:    when to take this  additional instructions   SOOLANTRA 1 % Crea Generic drug:  Ivermectin Apply 1 application topically daily as needed (rosacea).   traMADol 50 MG tablet Commonly known as:  ULTRAM Take 1 tablet  (50 mg total) by mouth every 4 (four) hours as needed for moderate pain.   Turmeric Curcumin Caps Take 1 capsule by mouth daily. Theracurmin            Durable Medical Equipment  (From admission, onward)         Start     Ordered   07/28/18 1412  DME Walker rolling  Once    Question:  Patient needs a walker to treat with the following condition  Answer:  Total knee replacement status   07/28/18 1411   07/28/18 1412  DME Bedside commode  Once    Question:  Patient needs a bedside commode to treat with the following condition  Answer:  Total knee replacement status   07/28/18 1411         Follow-up Information    Watt Climes, PA On 08/10/2018.   Specialty:  Physician Assistant Why:  at 9:15am Contact information: [place] [address] [phone]        Dereck Leep, MD On 09/07/2018.   Specialty:  Orthopedic Surgery Why:  at 9:15am Contact information: [place] [address] [phone]           Signed: Prescott Parma, Ruth Tully 07/30/2018, 6:43 AM   Objective: Vital signs in last 24 hours: Temp:  [97.4 F (36.3 C)-98.4 F (36.9 C)] 97.4 F (36.3 C) (02/06 2353) Pulse Rate:  [73-78] 78 (02/06 2353) Resp:  [16] 16 (02/06 2353) BP: (111-124)/(57-94) 124/59 (02/06 2353) SpO2:  [97 %-100 %] 97 % (02/06 2353)  Intake/Output from previous day:  Intake/Output Summary (Last 24 hours) at 07/30/2018 0643 Last data filed at 07/30/2018 0501 Gross per 24 hour  Intake 1493.59 ml  Output 280 ml  Net 1213.59 ml    Intake/Output this shift: Total I/O In: -  Out: 110 [Drains:110]  Labs: No results for input(s): HGB in the last 72 hours. No results for input(s): WBC, RBC, HCT, PLT in the last 72 hours. No results for input(s): NA, K, CL, CO2, BUN, CREATININE, GLUCOSE, CALCIUM in the last 72 hours. No results for input(s): LABPT, INR in the last 72 hours.  EXAM: General - Patient is Alert and  Oriented Extremity - Neurovascular intact Intact pulses distally Compartment soft Incision - clean, dry, no drainage, with the Hemovac removed.  The Hemovac tubes appear to be intact. Motor Function -plantarflexion and dorsiflexion are intact.  Able to do a straight leg raise independently.  Assessment/Plan: 2 Days Post-Op Procedure(s) (LRB): COMPUTER ASSISTED TOTAL KNEE ARTHROPLASTY (Right) Procedure(s) (LRB): COMPUTER ASSISTED TOTAL KNEE ARTHROPLASTY (Right) Past Medical History:  Diagnosis Date  . Allergic rhinitis   . Anemia, iron deficiency   . Anxiety   . Arthritis   . Asthma    no symptoms last 25 years  . Colon polyps   . Family history of adverse reaction to anesthesia    sister PONV  . Heavy menses   . Hemorrhoids   . Hidradenitis suppurativa   . History of  exercise stress test 06/1999   neg  . History of MRI 2003   neg per patient  . History of MRI 2007   right quad fatty defect  . History of MRI of spine 03/2004   C-5, C5-6 disk protrusion  . Hyperlipidemia    LDL  . Rosacea   . Spondylosis    deg lumbar disc dz  . Wisdom teeth removed   . Zoster    Active Problems:   Total knee replacement status  Estimated body mass index is 33.31 kg/m as calculated from the following:   Height as of this encounter: 5' 6.5" (1.689 m).   Weight as of this encounter: 95 kg. Advance diet Up with therapy Discharge home with home health Diet - Regular diet Follow up - in 2 weeks Activity - WBAT Disposition - Home Condition Upon Discharge - Good DVT Prophylaxis - Lovenox  Reche Dixon, PA-C Orthopaedic Surgery 07/30/2018, 6:43 AM

## 2018-07-30 NOTE — Progress Notes (Signed)
  Subjective: 2 Days Post-Op Procedure(s) (LRB): COMPUTER ASSISTED TOTAL KNEE ARTHROPLASTY (Right) Patient reports pain as mild.   Patient seen in rounds with Dr. Marry Guan. Patient is well, and has had no acute complaints or problems Plan is to go Home after hospital stay. Negative for chest pain and shortness of breath Fever: no Gastrointestinal: Negative for nausea and vomiting  Objective: Vital signs in last 24 hours: Temp:  [97.4 F (36.3 C)-98.4 F (36.9 C)] 97.4 F (36.3 C) (02/06 2353) Pulse Rate:  [73-78] 78 (02/06 2353) Resp:  [16] 16 (02/06 2353) BP: (111-124)/(57-94) 124/59 (02/06 2353) SpO2:  [97 %-100 %] 97 % (02/06 2353)  Intake/Output from previous day:  Intake/Output Summary (Last 24 hours) at 07/30/2018 0640 Last data filed at 07/30/2018 0501 Gross per 24 hour  Intake 1493.59 ml  Output 280 ml  Net 1213.59 ml    Intake/Output this shift: Total I/O In: -  Out: 110 [Drains:110]  Labs: No results for input(s): HGB in the last 72 hours. No results for input(s): WBC, RBC, HCT, PLT in the last 72 hours. No results for input(s): NA, K, CL, CO2, BUN, CREATININE, GLUCOSE, CALCIUM in the last 72 hours. No results for input(s): LABPT, INR in the last 72 hours.   EXAM General - Patient is Alert and Oriented Extremity - Neurovascular intact Sensation intact distally Dorsiflexion/Plantar flexion intact Compartment soft Dressing/Incision - clean, dry, no drainage, with the Hemovac removed.  The Hemovac tubing appeared to be intact. Motor Function - intact, moving foot and toes well on exam.  Able to straight leg raise independently  Past Medical History:  Diagnosis Date  . Allergic rhinitis   . Anemia, iron deficiency   . Anxiety   . Arthritis   . Asthma    no symptoms last 25 years  . Colon polyps   . Family history of adverse reaction to anesthesia    sister PONV  . Heavy menses   . Hemorrhoids   . Hidradenitis suppurativa   . History of exercise stress  test 06/1999   neg  . History of MRI 2003   neg per patient  . History of MRI 2007   right quad fatty defect  . History of MRI of spine 03/2004   C-5, C5-6 disk protrusion  . Hyperlipidemia    LDL  . Rosacea   . Spondylosis    deg lumbar disc dz  . Wisdom teeth removed   . Zoster     Assessment/Plan: 2 Days Post-Op Procedure(s) (LRB): COMPUTER ASSISTED TOTAL KNEE ARTHROPLASTY (Right) Active Problems:   Total knee replacement status  Estimated body mass index is 33.31 kg/m as calculated from the following:   Height as of this encounter: 5' 6.5" (1.689 m).   Weight as of this encounter: 95 kg. Advance diet Up with therapy D/C IV fluids Discharge home with home health today. Needs to have a bowel movement before discharge. Follow-up [facility] in 2 weeks for staple removal.  DVT Prophylaxis - Lovenox, Foot Pumps and TED hose Weight-Bearing as tolerated to right leg  Reche Dixon, PA-C Orthopaedic Surgery 07/30/2018, 6:40 AM

## 2018-08-27 ENCOUNTER — Encounter: Payer: Self-pay | Admitting: Podiatry

## 2018-08-27 ENCOUNTER — Ambulatory Visit: Payer: BC Managed Care – PPO | Admitting: Podiatry

## 2018-08-27 VITALS — BP 113/66 | HR 86

## 2018-08-27 DIAGNOSIS — L600 Ingrowing nail: Secondary | ICD-10-CM | POA: Diagnosis not present

## 2018-08-27 MED ORDER — DOXYCYCLINE HYCLATE 100 MG PO TABS: 100.0000 mg | ORAL_TABLET | Freq: Two times a day (BID) | ORAL | 0 refills | Status: DC

## 2018-08-27 MED ORDER — GENTAMICIN SULFATE 0.1 % EX CREA: 1.0000 "application " | TOPICAL_CREAM | Freq: Two times a day (BID) | CUTANEOUS | 0 refills | Status: DC

## 2018-08-27 NOTE — Patient Instructions (Signed)

## 2018-08-27 NOTE — Progress Notes (Signed)
   Subjective: Patient presents today for evaluation of pain to the lateral border of the right hallux that began 2 weeks ago. She reports associated bloody purulent drainage. Patient is concerned for possible ingrown nail. Touching the toe increases the pain. She has not done anything for treatment. Patient presents today for further treatment and evaluation.  Past Medical History:  Diagnosis Date  . Allergic rhinitis   . Anemia, iron deficiency   . Anxiety   . Arthritis   . Asthma    no symptoms last 25 years  . Colon polyps   . Family history of adverse reaction to anesthesia    sister PONV  . Heavy menses   . Hemorrhoids   . Hidradenitis suppurativa   . History of exercise stress test 06/1999   neg  . History of MRI 2003   neg per patient  . History of MRI 2007   right quad fatty defect  . History of MRI of spine 03/2004   C-5, C5-6 disk protrusion  . Hyperlipidemia    LDL  . Rosacea   . Spondylosis    deg lumbar disc dz  . Wisdom teeth removed   . Zoster     Objective:  General: Well developed, nourished, in no acute distress, alert and oriented x3   Dermatology: Skin is warm, dry and supple bilateral. Lateral border of the right great toe appears to be erythematous with evidence of an ingrowing nail. Pain on palpation noted to the border of the nail fold. The remaining nails appear unremarkable at this time. There are no open sores, lesions.  Vascular: Dorsalis Pedis artery and Posterior Tibial artery pedal pulses palpable. No lower extremity edema noted.   Neruologic: Grossly intact via light touch bilateral.  Musculoskeletal: Muscular strength within normal limits in all groups bilateral. Normal range of motion noted to all pedal and ankle joints.   Assesement: #1 Paronychia with ingrowing nail lateral border right hallux  #2 Pain in toe #3 Incurvated nail  Plan of Care:  1. Patient evaluated.  2. Discussed treatment alternatives and plan of care.  Explained nail avulsion procedure and post procedure course to patient. 3. Patient opted for permanent partial nail avulsion of the lateral border of the right hallux.  4. Prior to procedure, local anesthesia infiltration utilized using 3 ml of a 50:50 mixture of 2% plain lidocaine and 0.5% plain marcaine in a normal hallux block fashion and a betadine prep performed.  5. Partial permanent nail avulsion with chemical matrixectomy performed using [id number] applications of phenol followed by alcohol flush.  6. Light dressing applied. 7. Prescription for Gentamicin cream provided to patient to use daily with a bandage.  8. Prescription for Doxycycline #20 provided to patient.  9. Return to clinic in 2 weeks.   Edrick Kins, DPM [facility]  Dr. Edrick Kins, [place]                                        [place],  [address]                Office [phone]  Fax [phone]

## 2018-09-10 ENCOUNTER — Other Ambulatory Visit: Payer: Self-pay

## 2018-09-10 ENCOUNTER — Ambulatory Visit: Payer: BC Managed Care – PPO | Admitting: Podiatry

## 2018-09-10 ENCOUNTER — Encounter: Payer: Self-pay | Admitting: Podiatry

## 2018-09-10 DIAGNOSIS — L600 Ingrowing nail: Secondary | ICD-10-CM

## 2018-09-14 NOTE — Progress Notes (Signed)
   Subjective: Patient presents today 2 weeks post permanent nail avulsion procedure of the lateral border of the right hallux that was performed on 08/27/2018. She states she is doing well and improving. She reports some tenderness and redness of the area but denies any drainage. She states the top of the nail hits against her shoe increasing the pain. She has finished the Doxycycline and has been applying Gentamicin as directed. Patient is here for further evaluation and treatment.   Past Medical History:  Diagnosis Date  . Allergic rhinitis   . Anemia, iron deficiency   . Anxiety   . Arthritis   . Asthma    no symptoms last 25 years  . Colon polyps   . Family history of adverse reaction to anesthesia    sister PONV  . Heavy menses   . Hemorrhoids   . Hidradenitis suppurativa   . History of exercise stress test 06/1999   neg  . History of MRI 2003   neg per patient  . History of MRI 2007   right quad fatty defect  . History of MRI of spine 03/2004   C-5, C5-6 disk protrusion  . Hyperlipidemia    LDL  . Rosacea   . Spondylosis    deg lumbar disc dz  . Wisdom teeth removed   . Zoster     Objective: Skin is warm, dry and supple. Nail and respective nail fold appears to be healing appropriately. Open wound to the associated nail fold with a granular wound base and moderate amount of fibrotic tissue. Minimal drainage noted. Mild erythema around the periungual region likely due to phenol chemical matricectomy.  Assessment: #1 postop permanent partial nail avulsion lateral border right hallux  #2 open wound periungual nail fold of respective digit.   Plan of care: #1 patient was evaluated  #2 debridement of open wound was performed to the periungual border of the respective toe using a currette. Antibiotic ointment and Band-Aid was applied. #3 patient is to return to clinic on a PRN basis.   Edrick Kins, DPM [facility]  Dr. Edrick Kins, [place]                                        [place], [place] [address]                Office [phone]  Fax [phone]

## 2018-10-04 ENCOUNTER — Telehealth: Payer: Self-pay

## 2018-10-04 MED ORDER — AMOXICILLIN 500 MG PO CAPS: ORAL_CAPSULE | ORAL | 1 refills | Status: DC

## 2018-10-04 NOTE — Telephone Encounter (Signed)
Pt left v/m; pt had total knee replacement 07/2018 and pt request amoxicillin for upcoming dental appt; last got abx from ortho but pt was advised to request abx from PCP. [facility]. Pt last seen 01/12/18 for annual exam.Please advise.

## 2018-10-04 NOTE — Telephone Encounter (Signed)
I sent it  

## 2019-01-12 ENCOUNTER — Telehealth: Payer: Self-pay

## 2019-01-12 NOTE — Telephone Encounter (Signed)
Left detailed VM w COVID screen and back door lab info   

## 2019-01-13 ENCOUNTER — Telehealth: Payer: Self-pay | Admitting: Family Medicine

## 2019-01-13 DIAGNOSIS — E7800 Pure hypercholesterolemia, unspecified: Secondary | ICD-10-CM

## 2019-01-13 DIAGNOSIS — Z0000 Encounter for general adult medical examination without abnormal findings: Secondary | ICD-10-CM

## 2019-01-13 NOTE — Telephone Encounter (Signed)
-----   Message from Ellamae Sia sent at 01/05/2019 11:10 AM EDT ----- Regarding: Lab orders for Friday, 7.24.20 Patient is scheduled for CPX labs, please order future labs, Thanks , Karna Christmas

## 2019-01-14 ENCOUNTER — Other Ambulatory Visit: Payer: Self-pay

## 2019-01-14 ENCOUNTER — Other Ambulatory Visit (INDEPENDENT_AMBULATORY_CARE_PROVIDER_SITE_OTHER): Payer: BC Managed Care – PPO

## 2019-01-14 DIAGNOSIS — Z0000 Encounter for general adult medical examination without abnormal findings: Secondary | ICD-10-CM

## 2019-01-14 DIAGNOSIS — E7800 Pure hypercholesterolemia, unspecified: Secondary | ICD-10-CM

## 2019-01-14 LAB — COMPREHENSIVE METABOLIC PANEL
ALT: 17 U/L (ref 0–35)
AST: 23 U/L (ref 0–37)
Albumin: 4.4 g/dL (ref 3.5–5.2)
Alkaline Phosphatase: 63 U/L (ref 39–117)
BUN: 24 mg/dL — ABNORMAL HIGH (ref 6–23)
CO2: 29 mEq/L (ref 19–32)
Calcium: 9.6 mg/dL (ref 8.4–10.5)
Chloride: 105 mEq/L (ref 96–112)
Creatinine, Ser: 0.77 mg/dL (ref 0.40–1.20)
GFR: 75.83 mL/min (ref >60.00)
Glucose, Bld: 89 mg/dL (ref 70–99)
Potassium: 4.3 mEq/L (ref 3.5–5.1)
Sodium: 140 mEq/L (ref 135–145)
Total Bilirubin: 0.7 mg/dL (ref 0.2–1.2)
Total Protein: 6.9 g/dL (ref 6.0–8.3)

## 2019-01-14 LAB — CBC WITH DIFFERENTIAL/PLATELET
Basophils Absolute: 0.1 10*3/uL (ref 0.0–0.1)
Basophils Relative: 1.2 % (ref 0.0–3.0)
Eosinophils Absolute: 0.2 10*3/uL (ref 0.0–0.7)
Eosinophils Relative: 3.1 % (ref 0.0–5.0)
HCT: 40.8 % (ref 36.0–46.0)
Hemoglobin: 13.2 g/dL (ref 12.0–15.0)
Lymphocytes Relative: 29.6 % (ref 12.0–46.0)
Lymphs Abs: 1.5 10*3/uL (ref 0.7–4.0)
MCHC: 32.2 g/dL (ref 30.0–36.0)
MCV: 79.7 fl (ref 78.0–100.0)
Monocytes Absolute: 0.5 10*3/uL (ref 0.1–1.0)
Monocytes Relative: 9.4 % (ref 3.0–12.0)
Neutro Abs: 2.9 10*3/uL (ref 1.4–7.7)
Neutrophils Relative %: 56.7 % (ref 43.0–77.0)
Platelets: 202 10*3/uL (ref 150.0–400.0)
RBC: 5.13 Mil/uL — ABNORMAL HIGH (ref 3.87–5.11)
RDW: 18 % — ABNORMAL HIGH (ref 11.5–15.5)
WBC: 5.1 10*3/uL (ref 4.0–10.5)

## 2019-01-14 LAB — LIPID PANEL
Cholesterol: 175 mg/dL (ref 0–200)
HDL: 69.5 mg/dL (ref >39.00)
LDL Cholesterol: 93 mg/dL (ref 0–99)
NonHDL: 105.02
Total CHOL/HDL Ratio: 3
Triglycerides: 59 mg/dL (ref 0.0–149.0)
VLDL: 11.8 mg/dL (ref 0.0–40.0)

## 2019-01-14 LAB — TSH: TSH: 3.24 u[IU]/mL (ref 0.35–4.50)

## 2019-01-19 ENCOUNTER — Encounter: Payer: Self-pay | Admitting: Family Medicine

## 2019-01-19 ENCOUNTER — Ambulatory Visit (INDEPENDENT_AMBULATORY_CARE_PROVIDER_SITE_OTHER): Payer: BC Managed Care – PPO | Admitting: Family Medicine

## 2019-01-19 ENCOUNTER — Other Ambulatory Visit: Payer: Self-pay

## 2019-01-19 VITALS — BP 118/68 | HR 72 | Temp 97.2°F | Ht 67.0 in | Wt 217.1 lb

## 2019-01-19 DIAGNOSIS — Z1231 Encounter for screening mammogram for malignant neoplasm of breast: Secondary | ICD-10-CM | POA: Diagnosis not present

## 2019-01-19 DIAGNOSIS — E7800 Pure hypercholesterolemia, unspecified: Secondary | ICD-10-CM | POA: Diagnosis not present

## 2019-01-19 DIAGNOSIS — Z0000 Encounter for general adult medical examination without abnormal findings: Secondary | ICD-10-CM | POA: Diagnosis not present

## 2019-01-19 DIAGNOSIS — Z1211 Encounter for screening for malignant neoplasm of colon: Secondary | ICD-10-CM | POA: Insufficient documentation

## 2019-01-19 DIAGNOSIS — E669 Obesity, unspecified: Secondary | ICD-10-CM

## 2019-01-19 DIAGNOSIS — M1711 Unilateral primary osteoarthritis, right knee: Secondary | ICD-10-CM

## 2019-01-19 MED ORDER — ROSUVASTATIN CALCIUM 5 MG PO TABS: 2.5000 mg | ORAL_TABLET | Freq: Every day | ORAL | 11 refills | Status: DC

## 2019-01-19 NOTE — Assessment & Plan Note (Signed)
Did well with both knee replacements this year

## 2019-01-19 NOTE — Progress Notes (Signed)
Subjective:    Patient ID: Michele Cruz, female    DOB: 09/02/1955, 63 y.o.   MRN: [id number]  HPI Here for health maintenance exam and to review chronic medical problems    Pt had both knees replaced this year  Rehab went very well  Some residual soreness- is back to almost normal  Can walk up to 5 miles   Taking good care of herself   She is part of an exercise /training class  10 of them /senior class  Indoors  No masks   Wt Readings from Last 3 Encounters:  01/19/19 217 lb 2 oz (98.5 kg)  07/28/18 209 lb 8 oz (95 kg)  07/22/18 209 lb 8 oz (95 kg)  is getting back to exercise - after 2 knee replacements 34.01 kg/m   BP Readings from Last 3 Encounters:  01/19/19 118/68  08/27/18 113/66  07/30/18 119/77    Mammogram 7/18 -she wants to do it locally /[place]  Self breast exam   Flu shot-plans in the fall  Tetanus shot 3/12 Zoster status - interested in shingrix   Colonoscopy 8/13 with 7 y recall  Wants to get that set up    Pap 6/18 -negative No new partners No symptoms    Hyperlipidemia Lab Results  Component Value Date   CHOL 175 01/14/2019   CHOL 152 03/01/2018   CHOL 237 (H) 01/11/2018   Lab Results  Component Value Date   HDL 69.50 01/14/2019   HDL 66.20 03/01/2018   HDL 63.70 01/11/2018   Lab Results  Component Value Date   LDLCALC 93 01/14/2019   LDLCALC 75 03/01/2018   LDLCALC 158 (H) 01/11/2018   Lab Results  Component Value Date   TRIG 59.0 01/14/2019   TRIG 55.0 03/01/2018   TRIG 79.0 01/11/2018   Lab Results  Component Value Date   CHOLHDL 3 01/14/2019   CHOLHDL 2 03/01/2018   CHOLHDL 4 01/11/2018   Lab Results  Component Value Date   LDLDIRECT 152.4 03/27/2011   LDLDIRECT 150.6 03/20/2010   LDLDIRECT 150.4 12/11/2009   crestor and diet  she cut dose in 1/2  Eating healthy    Other labs Results for orders placed or performed in visit on 01/14/19  TSH  Result Value Ref Range   TSH 3.24 0.35 - 4.50 uIU/mL   Lipid panel  Result Value Ref Range   Cholesterol 175 0 - 200 mg/dL   Triglycerides 59.0 0.0 - 149.0 mg/dL   HDL 69.50 >39.00 mg/dL   VLDL 11.8 0.0 - 40.0 mg/dL   LDL Cholesterol 93 0 - 99 mg/dL   Total CHOL/HDL Ratio 3    NonHDL 105.02   CBC with Differential/Platelet  Result Value Ref Range   WBC 5.1 4.0 - 10.5 K/uL   RBC 5.13 (H) 3.87 - 5.11 Mil/uL   Hemoglobin 13.2 12.0 - 15.0 g/dL   HCT 40.8 36.0 - 46.0 %   MCV 79.7 78.0 - 100.0 fl   MCHC 32.2 30.0 - 36.0 g/dL   RDW 18.0 (H) 11.5 - 15.5 %   Platelets 202.0 150.0 - 400.0 K/uL   Neutrophils Relative % 56.7 43.0 - 77.0 %   Lymphocytes Relative 29.6 12.0 - 46.0 %   Monocytes Relative 9.4 3.0 - 12.0 %   Eosinophils Relative 3.1 0.0 - 5.0 %   Basophils Relative 1.2 0.0 - 3.0 %   Neutro Abs 2.9 1.4 - 7.7 K/uL   Lymphs Abs 1.5 0.7 - 4.0 K/uL  Monocytes Absolute 0.5 0.1 - 1.0 K/uL   Eosinophils Absolute 0.2 0.0 - 0.7 K/uL   Basophils Absolute 0.1 0.0 - 0.1 K/uL  Comprehensive metabolic panel  Result Value Ref Range   Sodium 140 135 - 145 mEq/L   Potassium 4.3 3.5 - 5.1 mEq/L   Chloride 105 96 - 112 mEq/L   CO2 29 19 - 32 mEq/L   Glucose, Bld 89 70 - 99 mg/dL   BUN 24 (H) 6 - 23 mg/dL   Creatinine, Ser 0.77 0.40 - 1.20 mg/dL   Total Bilirubin 0.7 0.2 - 1.2 mg/dL   Alkaline Phosphatase 63 39 - 117 U/L   AST 23 0 - 37 U/L   ALT 17 0 - 35 U/L   Total Protein 6.9 6.0 - 8.3 g/dL   Albumin 4.4 3.5 - 5.2 g/dL   Calcium 9.6 8.4 - 10.5 mg/dL   GFR 75.83 >60.00 mL/min     Patient Active Problem List   Diagnosis Date Noted  . Colon cancer screening 01/19/2019  . Total knee replacement status 07/28/2018  . 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  . Internal hemorrhoids 07/21/2017  . Hydradenitis 07/21/2017  . Obesity (BMI 30-39.9) 12/09/2016  . Encounter for routine gynecological examination 01/10/2014  . Primary osteoarthritis of right knee 06/21/2012  . Screening mammogram, encounter for 04/07/2011  . Routine  general medical examination at a health care facility 03/25/2011  . HYPERCHOLESTEROLEMIA 12/11/2009  . COLONIC POLYPS, HX OF 08/03/2009  . HERNIATED CERVICAL DISC 06/20/2009  . Herniated cervical disc 06/20/2009  . BACK PAIN, LUMBAR, WITH RADICULOPATHY 10/13/2008  . Generalized anxiety disorder 05/24/2007  . ALLERGIC RHINITIS 05/24/2007  . ROSACEA 05/24/2007  . CARDIAC MURMUR 05/24/2007   Past Medical History:  Diagnosis Date  . Allergic rhinitis   . Anemia, iron deficiency   . Anxiety   . Arthritis   . Asthma    no symptoms last 25 years  . Colon polyps   . Family history of adverse reaction to anesthesia    sister PONV  . Heavy menses   . Hemorrhoids   . Hidradenitis suppurativa   . History of exercise stress test 06/1999   neg  . History of MRI 2003   neg per patient  . History of MRI 2007   right quad fatty defect  . History of MRI of spine 03/2004   C-5, C5-6 disk protrusion  . Hyperlipidemia    LDL  . Rosacea   . Spondylosis    deg lumbar disc dz  . Wisdom teeth removed   . Zoster    Past Surgical History:  Procedure Laterality Date  . BREAST CYST ASPIRATION  03/2002  . BREAST CYST ASPIRATION  03/2004   left  . CESAREAN SECTION    . COLONOSCOPY    . KNEE ARTHROPLASTY Left 03/15/2018   Procedure: COMPUTER ASSISTED TOTAL KNEE ARTHROPLASTY;  Surgeon: Dereck Leep, MD;  Location: [facility] ORS;  Service: Orthopedics;  Laterality: Left;  . KNEE ARTHROPLASTY Right 07/28/2018   Procedure: COMPUTER ASSISTED TOTAL KNEE ARTHROPLASTY;  Surgeon: Dereck Leep, MD;  Location: [facility] ORS;  Service: Orthopedics;  Laterality: Right;  . KNEE SURGERY     left   Social History   Tobacco Use  . Smoking status: Never Smoker  . Smokeless tobacco: Never Used  Substance Use Topics  . Alcohol use: Yes    Alcohol/week: 2.0 standard drinks    Types: 2 Glasses of wine per week  Comment: 2-3 glasses wine or beer weekly per pt.  . Drug use: No   Family History  Problem  Relation Age of Onset  . Arthritis Mother        OA  . Obesity Mother   . Heart disease Father        CAD  . Alcohol abuse Father   . Diabetes Father   . Hyperlipidemia Sister   . Diabetes Other   . Colon cancer Neg Hx    Allergies  Allergen Reactions  . Decadron [Dexamethasone] Other (See Comments)    Unsure if related---swelling of throat  . Depo-Medrol [Methylprednisolone Acetate]     Rash after injection, a noted common SE from depo-medrol listed in epocrates and most resources  . Lipitor [Atorvastatin Calcium] Other (See Comments)    Leg cramps and pain  . Meloxicam     Pt felt dizzy potentially from this, but does well on aleve  . Simvastatin     Possible transient memory changes, resolved off medicine. Muscle pain   Current Outpatient Medications on File Prior to Visit  Medication Sig Dispense Refill  . Carboxymethylcellul-Glycerin (LUBRICATING EYE DROPS OP) Place 1 drop into both eyes 2 (two) times daily as needed (dry eyes).    . COLLAGEN PO Take 4 capsules by mouth daily. Collagen 2 - 600 mg per capsule    . Fluocinolone Acetonide 0.01 % OIL Place 1 drop into both ears once a week.     . loratadine (CLARITIN) 10 MG tablet Take 10 mg by mouth daily.    . Magnesium Oxide 250 MG TABS Take 250 mg by mouth daily.     . Misc Natural Products (TURMERIC CURCUMIN) CAPS Take 1 capsule by mouth daily. Theracurmin    . Omega-3 Fatty Acids (FISH OIL) 1200 MG CAPS Take 1,200 mg by mouth daily.     Marland Kitchen amoxicillin (AMOXIL) 500 MG capsule Take 4 pills by mouth an hour before dental work (Patient not taking: Reported on 01/19/2019) 4 capsule 1   No current facility-administered medications on file prior to visit.     Review of Systems  Constitutional: Negative for activity change, appetite change, fatigue, fever and unexpected weight change.  HENT: Negative for congestion, ear pain, rhinorrhea, sinus pressure and sore throat.   Eyes: Negative for pain, redness and visual disturbance.   Respiratory: Negative for cough, shortness of breath and wheezing.   Cardiovascular: Negative for chest pain and palpitations.  Gastrointestinal: Negative for abdominal pain, blood in stool, constipation and diarrhea.  Endocrine: Negative for polydipsia and polyuria.  Genitourinary: Negative for dysuria, frequency and urgency.  Musculoskeletal: Positive for arthralgias. Negative for back pain and myalgias.       Knee pain is improve s/p bilat knee replacements  Skin: Negative for pallor and rash.  Allergic/Immunologic: Negative for environmental allergies.  Neurological: Negative for dizziness, syncope and headaches.  Hematological: Negative for adenopathy. Does not bruise/bleed easily.  Psychiatric/Behavioral: Negative for decreased concentration and dysphoric mood. The patient is not nervous/anxious.        Objective:   Physical Exam Constitutional:      General: She is not in acute distress.    Appearance: Normal appearance. She is well-developed. She is obese. She is not ill-appearing.  HENT:     Head: Normocephalic and atraumatic.     Right Ear: Tympanic membrane, ear canal and external ear normal.     Left Ear: Tympanic membrane, ear canal and external ear normal.  Nose: Nose normal.     Mouth/Throat:     Mouth: Mucous membranes are moist.     Pharynx: Oropharynx is clear. No posterior oropharyngeal erythema.  Eyes:     General: No scleral icterus.    Conjunctiva/sclera: Conjunctivae normal.     Pupils: Pupils are equal, round, and reactive to light.  Neck:     Musculoskeletal: Normal range of motion and neck supple. No neck rigidity or muscular tenderness.     Thyroid: No thyromegaly.     Vascular: No carotid bruit or JVD.  Cardiovascular:     Rate and Rhythm: Normal rate and regular rhythm.     Pulses: Normal pulses.     Heart sounds: Murmur present. No gallop.   Pulmonary:     Effort: Pulmonary effort is normal. No respiratory distress.     Breath sounds: Normal  breath sounds. No wheezing or rales.     Comments: Good air exch Chest:     Chest wall: No tenderness.  Abdominal:     General: Bowel sounds are normal. There is no distension or abdominal bruit.     Palpations: Abdomen is soft. There is no mass.     Tenderness: There is no abdominal tenderness.     Hernia: No hernia is present.  Genitourinary:    Comments: Breast exam: No mass, nodules, thickening, tenderness, bulging, retraction, inflamation, nipple discharge or skin changes noted.  No axillary or clavicular LA.     Musculoskeletal: Normal range of motion.        General: No tenderness.     Right lower leg: No edema.     Left lower leg: No edema.  Lymphadenopathy:     Cervical: No cervical adenopathy.  Skin:    General: Skin is warm and dry.     Coloration: Skin is not pale.     Findings: No erythema or rash.     Comments: Solar lentigines diffusely   Neurological:     Mental Status: She is alert. Mental status is at baseline.     Cranial Nerves: No cranial nerve deficit.     Motor: No weakness or abnormal muscle tone.     Coordination: Coordination normal.     Gait: Gait normal.     Deep Tendon Reflexes: Reflexes are normal and symmetric.  Psychiatric:        Mood and Affect: Mood is not anxious or depressed.        Cognition and Memory: Memory normal.     Comments: Pleasant            Assessment & Plan:   Problem List Items Addressed This Visit      Musculoskeletal and Integument   Primary osteoarthritis of right knee    Did well with both knee replacements this year         Other   HYPERCHOLESTEROLEMIA    Controlled with crestor and diet  LDL in 90s -up a bit with worse diet for a while Disc goals for lipids and reasons to control them Rev last labs with pt Rev low sat fat diet in detail       Relevant Medications   rosuvastatin (CRESTOR) 5 MG tablet   Routine general medical examination at a health care facility - Primary    Reviewed health habits  including diet and exercise and skin cancer prevention Reviewed appropriate screening tests for age  Also reviewed health mt list, fam hx and immunization status , as well as social  and family history   See HPI Labs reviewed  Referral done for colonoscopy Also for mammogram-pt will schedule her own appt Strongly enc flu shot in the fall  Also enc work on wt loss with diet and exercise  Pt plans to look into coverage for the shingrix vaccine and get it if covered      Screening mammogram, encounter for    Nl exam Enc self exams Ref done for mammogram screening Pt will make her own appt      Relevant Orders   MM 3D SCREEN BREAST BILATERAL   Obesity (BMI 30-39.9)    Discussed how this problem influences overall health and the risks it imposes  Reviewed plan for weight loss with lower calorie diet (via better food choices and also portion control or program like weight watchers) and exercise building up to or more than 30 minutes 5 days per week including some aerobic activity         Colon cancer screening    Due for colonoscopy for  7 y recall  H/o adenomatous polyps  Ref done       Relevant Orders   Ambulatory referral to Gastroenterology

## 2019-01-19 NOTE — Patient Instructions (Addendum)
Think about some ways to have your exercise group outside   Call the [facility] breast center to set up a mammogram I did the referral   Don't forget a flu shot this fall   Check with your insurance- if the shingrix vaccine is covered then call us and we will put you on the waiting list   The office will call you to set up a colonoscopy

## 2019-01-19 NOTE — Assessment & Plan Note (Signed)
Reviewed health habits including diet and exercise and skin cancer prevention Reviewed appropriate screening tests for age  Also reviewed health mt list, fam hx and immunization status , as well as social and family history   See HPI Labs reviewed  Referral done for colonoscopy Also for mammogram-pt will schedule her own appt Strongly enc flu shot in the fall  Also enc work on wt loss with diet and exercise  Pt plans to look into coverage for the shingrix vaccine and get it if covered

## 2019-01-19 NOTE — Assessment & Plan Note (Signed)
Discussed how this problem influences overall health and the risks it imposes  Reviewed plan for weight loss with lower calorie diet (via better food choices and also portion control or program like weight watchers) and exercise building up to or more than 30 minutes 5 days per week including some aerobic activity    

## 2019-01-19 NOTE — Assessment & Plan Note (Addendum)
Due for colonoscopy for  7 y recall  H/o adenomatous polyps  Ref done

## 2019-01-19 NOTE — Assessment & Plan Note (Signed)
Nl exam Enc self exams Ref done for mammogram screening Pt will make her own appt

## 2019-01-19 NOTE — Assessment & Plan Note (Signed)
Controlled with crestor and diet  LDL in 90s -up a bit with worse diet for a while Disc goals for lipids and reasons to control them Rev last labs with pt Rev low sat fat diet in detail

## 2019-01-21 ENCOUNTER — Ambulatory Visit
Admission: RE | Admit: 2019-01-21 | Discharge: 2019-01-21 | Disposition: A | Discharge status: Home / Self Care | Payer: BC Managed Care – PPO | Source: Ambulatory Visit | Attending: Family Medicine | Admitting: Family Medicine

## 2019-01-21 ENCOUNTER — Encounter: Payer: Self-pay | Admitting: Internal Medicine

## 2019-01-21 DIAGNOSIS — Z1231 Encounter for screening mammogram for malignant neoplasm of breast: Secondary | ICD-10-CM | POA: Diagnosis not present

## 2019-02-15 ENCOUNTER — Telehealth: Payer: Self-pay

## 2019-02-15 MED ORDER — HYDROCORTISONE ACETATE 25 MG RE SUPP: 25.0000 mg | Freq: Every day | RECTAL | 3 refills | Status: DC

## 2019-02-15 NOTE — Telephone Encounter (Signed)
Pt left v/m; pt has internal hemorroids and request refill on anusol supp. Pt last seen annual 01/19/19. Last refilled anusal supp on 07/21/2017 # 7 x 1.Please advise. [facility].

## 2019-02-16 ENCOUNTER — Ambulatory Visit (AMBULATORY_SURGERY_CENTER): Payer: Self-pay | Admitting: *Deleted

## 2019-02-16 ENCOUNTER — Other Ambulatory Visit: Payer: Self-pay

## 2019-02-16 VITALS — Temp 96.9°F | Ht 67.0 in | Wt 218.0 lb

## 2019-02-16 DIAGNOSIS — Z860100 Personal history of colon polyps, unspecified: Secondary | ICD-10-CM

## 2019-02-16 NOTE — Progress Notes (Signed)
Patient denies any allergies to eggs or soy. Patient denies any problems with anesthesia/sedation. Patient denies any oxygen use at home. Patient denies taking any diet/weight loss medications or blood thinners. EMMI education assisgned to patient on colonoscopy, this was explained and instructions given to patient.Pt is aware that care partner will wait in the car during procedure; if they feel like they will be too hot to wait in the car; they may wait in the lobby.  We want them to wear a mask (we do not have any that we can provide them), practice social distancing, and we will check their temperatures when they get here.  I did remind patient that their care partner needs to stay in the parking lot the entire time. Pt will wear mask into building. 

## 2019-02-21 ENCOUNTER — Encounter: Payer: Self-pay | Admitting: Internal Medicine

## 2019-03-01 ENCOUNTER — Telehealth: Payer: Self-pay

## 2019-03-01 NOTE — Telephone Encounter (Signed)
Covid-19 screening questions   Do you now or have you had a fever in the last 14 days?  Do you have any respiratory symptoms of shortness of breath or cough now or in the last 14 days?  Do you have any family members or close contacts with diagnosed or suspected Covid-19 in the past 14 days?  Have you been tested for Covid-19 and found to be positive?       

## 2019-03-01 NOTE — Telephone Encounter (Signed)
Pt returned phone call states No to all questions.

## 2019-03-02 ENCOUNTER — Other Ambulatory Visit: Payer: Self-pay

## 2019-03-02 ENCOUNTER — Ambulatory Visit (AMBULATORY_SURGERY_CENTER): Payer: BC Managed Care – PPO | Admitting: Internal Medicine

## 2019-03-02 ENCOUNTER — Encounter: Payer: Self-pay | Admitting: Internal Medicine

## 2019-03-02 VITALS — BP 113/68 | HR 69 | Temp 98.6°F | Resp 11 | Ht 67.0 in | Wt 215.0 lb

## 2019-03-02 DIAGNOSIS — Z860100 Personal history of colon polyps, unspecified: Secondary | ICD-10-CM

## 2019-03-02 DIAGNOSIS — D123 Benign neoplasm of transverse colon: Secondary | ICD-10-CM

## 2019-03-02 MED ORDER — SODIUM CHLORIDE 0.9 % IV SOLN: 500.0000 mL | Freq: Once | INTRAVENOUS | Status: DC

## 2019-03-02 NOTE — Progress Notes (Signed)
Pt's states no medical or surgical changes since previsit or office visit.  Temp Michele Cruz VS C. [place]

## 2019-03-02 NOTE — Progress Notes (Signed)
Called to room to assist during endoscopic procedure.  Patient ID and intended procedure confirmed with present staff. Received instructions for my participation in the procedure from the performing physician.  

## 2019-03-02 NOTE — Patient Instructions (Addendum)
I found and removed 2 tiny polyps.  Other findings were hemorrhoids (swollen - likely from prep) and diverticulosis (common and not usually a problem)  If you have hemorrhoid problems (swelling, itching, bleeding) I am able to treat those with an in-office procedure. If you like, please call my office at [phone] to schedule an appointment and I can evaluate you further.  I will let you know pathology results and when to have another routine colonoscopy by mail and/or My Chart.  I appreciate the opportunity to care for you. Gatha Mayer, MD, FACG    YOU HAD AN ENDOSCOPIC PROCEDURE TODAY AT [facility]:   Refer to the procedure report that was given to you for any specific questions about what was found during the examination.  If the procedure report does not answer your questions, please call your gastroenterologist to clarify.  If you requested that your care partner not be given the details of your procedure findings, then the procedure report has been included in a sealed envelope for you to review at your convenience later.  YOU SHOULD EXPECT: Some feelings of bloating in the abdomen. Passage of more gas than usual.  Walking can help get rid of the air that was put into your GI tract during the procedure and reduce the bloating. If you had a lower endoscopy (such as a colonoscopy or flexible sigmoidoscopy) you may notice spotting of blood in your stool or on the toilet paper. If you underwent a bowel prep for your procedure, you may not have a normal bowel movement for a few days.  Please Note:  You might notice some irritation and congestion in your nose or some drainage.  This is from the oxygen used during your procedure.  There is no need for concern and it should clear up in a day or so.  SYMPTOMS TO REPORT IMMEDIATELY:   Following lower endoscopy (colonoscopy or flexible sigmoidoscopy):  Excessive amounts of blood in the stool  Significant tenderness or  worsening of abdominal pains  Swelling of the abdomen that is new, acute  Fever of 100F or higher   For urgent or emergent issues, a gastroenterologist can be reached at any hour by calling [phone].   DIET:  We do recommend a small meal at first, but then you may proceed to your regular diet.  Drink plenty of fluids but you should avoid alcoholic beverages for 24 hours.  ACTIVITY:  You should plan to take it easy for the rest of today and you should NOT DRIVE or use heavy machinery until tomorrow (because of the sedation medicines used during the test).    FOLLOW UP: Our staff will call the number listed on your records 48-72 hours following your procedure to check on you and address any questions or concerns that you may have regarding the information given to you following your procedure. If we do not reach you, we will leave a message.  We will attempt to reach you two times.  During this call, we will ask if you have developed any symptoms of COVID 19. If you develop any symptoms (ie: fever, flu-like symptoms, shortness of breath, cough etc.) before then, please call [phone].  If you test positive for Covid 19 in the 2 weeks post procedure, please call and report this information to [place].    If any biopsies were taken you will be contacted by phone or by letter within the next 1-3 weeks.  Please call us at [phone] if you have not heard about the biopsies in 3 weeks.    SIGNATURES/CONFIDENTIALITY: You and/or your care partner have signed paperwork which will be entered into your electronic medical record.  These signatures attest to the fact that that the information above on your After Visit Summary has been reviewed and is understood.  Full responsibility of the confidentiality of this discharge information lies with you and/or your care-partner.    Handouts were given to you on polyps, diverticulosis, hemorrhoids, and Hyder non-surgical hemorrhoid  treatment. You may resume your current medications today. Await biopsy results. Please call if any questions or concerns.

## 2019-03-02 NOTE — Progress Notes (Signed)
No problems noted in the recovery room. maw 

## 2019-03-02 NOTE — Progress Notes (Signed)
Report to PACU, RN, vss, BBS= Clear.  

## 2019-03-02 NOTE — Op Note (Signed)
[facility] Patient Name: Michele Cruz Procedure Date: 03/02/2019 8:50 AM MRN: [id number] Endoscopist: Gatha Mayer , MD Age: 63 Referring MD:  Date of Birth: [birth year] Gender: Female Account #: [id number] Procedure:                Colonoscopy Indications:              Surveillance: Personal history of adenomatous                            polyps on last colonoscopy > 5 years ago Medicines:                Propofol per Anesthesia, Monitored Anesthesia Care Procedure:                Pre-Anesthesia Assessment:                           - Prior to the procedure, a History and Physical                            was performed, and patient medications and                            allergies were reviewed. The patient's tolerance of                            previous anesthesia was also reviewed. The risks                            and benefits of the procedure and the sedation                            options and risks were discussed with the patient.                            All questions were answered, and informed consent                            was obtained. Prior Anticoagulants: The patient has                            taken no previous anticoagulant or antiplatelet                            agents. ASA Grade Assessment: II - A patient with                            mild systemic disease. After reviewing the risks                            and benefits, the patient was deemed in                            satisfactory condition to undergo the procedure.  After obtaining informed consent, the colonoscope                            was passed under direct vision. Throughout the                            procedure, the patient's blood pressure, pulse, and                            oxygen saturations were monitored continuously. The                            Colonoscope was introduced through the anus and   advanced to the the cecum, identified by                            appendiceal orifice and ileocecal valve. The                            colonoscopy was performed without difficulty. The                            patient tolerated the procedure well. The quality                            of the bowel preparation was excellent. The                            ileocecal valve, appendiceal orifice, and rectum                            were photographed. The bowel preparation used was                            Miralax via split dose instruction. Scope In: 9:00:44 AM Scope Out: 9:15:29 AM Scope Withdrawal Time: 0 hours 10 minutes 26 seconds  Total Procedure Duration: 0 hours 14 minutes 45 seconds  Findings:                 The perianal and digital rectal examinations were                            normal.                           Two sessile polyps were found in the transverse                            colon. The polyps were 1 to 2 mm in size. These                            polyps were removed with a cold biopsy forceps.                            Resection and retrieval  were complete. Verification                            of patient identification for the specimen was                            done. Estimated blood loss was minimal.                           Multiple diverticula were found in the sigmoid                            colon.                           External and internal hemorrhoids were found.                           The exam was otherwise without abnormality on                            direct and retroflexion views. Complications:            No immediate complications. Estimated Blood Loss:     Estimated blood loss was minimal. Impression:               - Two 1 to 2 mm polyps in the transverse colon,                            removed with a cold biopsy forceps. Resected and                            retrieved.                           - Diverticulosis in  the sigmoid colon.                           - External and internal hemorrhoids.                           - The examination was otherwise normal on direct                            and retroflexion views.                           - Personal history of colonic polyp 8 mm adenoma                            2008, no polyps 2013. Recommendation:           - Patient has a contact number available for                            emergencies. The signs and symptoms of potential  delayed complications were discussed with the                            patient. Return to normal activities tomorrow.                            Written discharge instructions were provided to the                            patient.                           - Resume previous diet.                           - Continue present medications.                           - Repeat colonoscopy is recommended. The                            colonoscopy date will be determined after pathology                            results from today's exam become available for                            review. Gatha Mayer, MD 03/02/2019 9:22:43 AM This report has been signed electronically.

## 2019-03-04 ENCOUNTER — Telehealth: Payer: Self-pay

## 2019-03-04 NOTE — Telephone Encounter (Signed)
  Follow up Call-  Call back number 03/02/2019  Post procedure Call Back phone  # [phone]  Permission to leave phone message Yes  Some recent data might be hidden     Patient questions:  Do you have a fever, pain , or abdominal swelling? No. Pain Score  0 *  Have you tolerated food without any problems? Yes.    Have you been able to return to your normal activities? Yes.    Do you have any questions about your discharge instructions: Diet   No. Medications  No. Follow up visit  No.  Do you have questions or concerns about your Care? No.  Actions: * If pain score is 4 or above: No action needed, pain <4.  1. Have you developed a fever since your procedure? no  2.   Have you had an respiratory symptoms (SOB or cough) since your procedure? no  3.   Have you tested positive for COVID 19 since your procedure no  4.   Have you had any family members/close contacts diagnosed with the COVID 19 since your procedure?  no   If yes to any of these questions please route to Joylene John, RN and Alphonsa Gin, Therapist, sports.

## 2019-03-10 ENCOUNTER — Encounter: Payer: Self-pay | Admitting: Internal Medicine

## 2019-03-10 NOTE — Progress Notes (Signed)
2 1-2 mm adenomas recall 2027 My Chart letter

## 2019-08-13 ENCOUNTER — Other Ambulatory Visit: Payer: Self-pay | Admitting: Family Medicine

## 2019-08-15 NOTE — Telephone Encounter (Signed)
Last filled on 10/04/18 #4 caps with 1 refill, CPE was on 01/19/19, please advise

## 2019-09-19 ENCOUNTER — Ambulatory Visit: Payer: BC Managed Care – PPO | Attending: Internal Medicine

## 2019-09-19 DIAGNOSIS — Z23 Encounter for immunization: Secondary | ICD-10-CM

## 2019-09-19 NOTE — Progress Notes (Signed)
   Covid-19 Vaccination Clinic  Name:  Michele Cruz    MRN: [id number] DOB: 01/21/1956  09/19/2019  [name] was observed post Covid-19 immunization for 15 minutes without incident. She was provided with Vaccine Information Sheet and instruction to access the V-Safe system.   Ms. Arch was instructed to call 911 with any severe reactions post vaccine: Marland Kitchen Difficulty breathing  . Swelling of face and throat  . A fast heartbeat  . A bad rash all over body  . Dizziness and weakness   Immunizations Administered    Name Date Dose VIS Date Route   Pfizer COVID-19 Vaccine 09/19/2019  3:06 PM 0.3 mL 06/03/2019 Intramuscular   Manufacturer: [place]   Lot: [id number]   [place]: [id number]

## 2019-10-12 ENCOUNTER — Ambulatory Visit: Payer: BC Managed Care – PPO | Attending: Internal Medicine

## 2019-10-12 DIAGNOSIS — Z23 Encounter for immunization: Secondary | ICD-10-CM

## 2019-10-12 NOTE — Progress Notes (Signed)
   Covid-19 Vaccination Clinic  Name:  Michele Cruz    MRN: [id number] DOB: 11/17/1955  10/12/2019  Michele Cruz was observed post Covid-19 immunization for 30 minutes based on pre-vaccination screening without incident. She was provided with Vaccine Information Sheet and instruction to access the V-Safe system.   Michele Cruz was instructed to call 911 with any severe reactions post vaccine: Marland Kitchen Difficulty breathing  . Swelling of face and throat  . A fast heartbeat  . A bad rash all over body  . Dizziness and weakness   Immunizations Administered    Name Date Dose VIS Date Route   Pfizer COVID-19 Vaccine 10/12/2019 11:42 AM 0.3 mL 08/17/2018 Intramuscular   Manufacturer: [place]   Lot: [id number]   [place]: [id number]

## 2020-01-19 ENCOUNTER — Telehealth: Payer: Self-pay | Admitting: Family Medicine

## 2020-01-19 DIAGNOSIS — E7800 Pure hypercholesterolemia, unspecified: Secondary | ICD-10-CM

## 2020-01-19 DIAGNOSIS — Z0000 Encounter for general adult medical examination without abnormal findings: Secondary | ICD-10-CM

## 2020-01-19 NOTE — Telephone Encounter (Signed)
-----   Message from Cloyd Stagers, RT sent at 01/03/2020 11:26 AM EDT ----- Regarding: Lab Orders for Friday 7.30.2021 Please place lab orders for Friday 7.30.2021, office visit for physical on Tuesday 8.3.2021 Thank you, Dyke Maes RT(R)

## 2020-01-20 ENCOUNTER — Other Ambulatory Visit (INDEPENDENT_AMBULATORY_CARE_PROVIDER_SITE_OTHER): Payer: BC Managed Care – PPO

## 2020-01-20 ENCOUNTER — Other Ambulatory Visit: Payer: Self-pay

## 2020-01-20 DIAGNOSIS — E7800 Pure hypercholesterolemia, unspecified: Secondary | ICD-10-CM

## 2020-01-20 DIAGNOSIS — Z0000 Encounter for general adult medical examination without abnormal findings: Secondary | ICD-10-CM

## 2020-01-20 LAB — CBC WITH DIFFERENTIAL/PLATELET
Basophils Absolute: 0.1 K/uL (ref 0.0–0.1)
Basophils Relative: 1.1 % (ref 0.0–3.0)
Eosinophils Absolute: 0.1 K/uL (ref 0.0–0.7)
Eosinophils Relative: 2.5 % (ref 0.0–5.0)
HCT: 40.7 % (ref 36.0–46.0)
Hemoglobin: 13.7 g/dL (ref 12.0–15.0)
Lymphocytes Relative: 31.7 % (ref 12.0–46.0)
Lymphs Abs: 1.7 K/uL (ref 0.7–4.0)
MCHC: 33.7 g/dL (ref 30.0–36.0)
MCV: 87.2 fl (ref 78.0–100.0)
Monocytes Absolute: 0.4 K/uL (ref 0.1–1.0)
Monocytes Relative: 7.2 % (ref 3.0–12.0)
Neutro Abs: 3 K/uL (ref 1.4–7.7)
Neutrophils Relative %: 57.5 % (ref 43.0–77.0)
Platelets: 198 K/uL (ref 150.0–400.0)
RBC: 4.67 Mil/uL (ref 3.87–5.11)
RDW: 14.7 % (ref 11.5–15.5)
WBC: 5.2 K/uL (ref 4.0–10.5)

## 2020-01-20 LAB — COMPREHENSIVE METABOLIC PANEL
ALT: 16 U/L (ref 0–35)
AST: 21 U/L (ref 0–37)
Albumin: 4.3 g/dL (ref 3.5–5.2)
Alkaline Phosphatase: 62 U/L (ref 39–117)
BUN: 18 mg/dL (ref 6–23)
CO2: 31 mEq/L (ref 19–32)
Calcium: 9.6 mg/dL (ref 8.4–10.5)
Chloride: 104 mEq/L (ref 96–112)
Creatinine, Ser: 0.87 mg/dL (ref 0.40–1.20)
GFR: 65.65 mL/min (ref >60.00)
Glucose, Bld: 99 mg/dL (ref 70–99)
Potassium: 4.5 mEq/L (ref 3.5–5.1)
Sodium: 140 mEq/L (ref 135–145)
Total Bilirubin: 0.8 mg/dL (ref 0.2–1.2)
Total Protein: 6.5 g/dL (ref 6.0–8.3)

## 2020-01-20 LAB — LIPID PANEL
Cholesterol: 185 mg/dL (ref 0–200)
HDL: 69.4 mg/dL
LDL Cholesterol: 98 mg/dL (ref 0–99)
NonHDL: 115.67
Total CHOL/HDL Ratio: 3
Triglycerides: 89 mg/dL (ref 0.0–149.0)
VLDL: 17.8 mg/dL (ref 0.0–40.0)

## 2020-01-20 LAB — TSH: TSH: 3.25 u[IU]/mL (ref 0.35–4.50)

## 2020-01-24 ENCOUNTER — Encounter: Payer: Self-pay | Admitting: Family Medicine

## 2020-01-24 ENCOUNTER — Ambulatory Visit (INDEPENDENT_AMBULATORY_CARE_PROVIDER_SITE_OTHER): Payer: BC Managed Care – PPO | Admitting: Family Medicine

## 2020-01-24 ENCOUNTER — Other Ambulatory Visit: Payer: Self-pay

## 2020-01-24 VITALS — BP 110/56 | HR 83 | Temp 97.0°F | Ht 66.5 in | Wt 221.6 lb

## 2020-01-24 DIAGNOSIS — Z23 Encounter for immunization: Secondary | ICD-10-CM

## 2020-01-24 DIAGNOSIS — Z0000 Encounter for general adult medical examination without abnormal findings: Secondary | ICD-10-CM

## 2020-01-24 DIAGNOSIS — E669 Obesity, unspecified: Secondary | ICD-10-CM

## 2020-01-24 DIAGNOSIS — F411 Generalized anxiety disorder: Secondary | ICD-10-CM

## 2020-01-24 DIAGNOSIS — E7800 Pure hypercholesterolemia, unspecified: Secondary | ICD-10-CM

## 2020-01-24 MED ORDER — ROSUVASTATIN CALCIUM 5 MG PO TABS: 2.5000 mg | ORAL_TABLET | Freq: Every day | ORAL | 11 refills | Status: DC

## 2020-01-24 NOTE — Assessment & Plan Note (Signed)
Reviewed health habits including diet and exercise and skin cancer prevention Reviewed appropriate screening tests for age  Also reviewed health mt list, fam hx and immunization status , as well as social and family history   See HPI Labs reviewed  Given phone number to schedule her mammogram Update Td immunization today  covid immunized  Has also had shingrix  Pap neg 6/18- will wait a year to repeat this (no past or current gyn problems) Enc healthy diet and exercise

## 2020-01-24 NOTE — Assessment & Plan Note (Signed)
Discussed how this problem influences overall health and the risks it imposes  Reviewed plan for weight loss with lower calorie diet (via better food choices and also portion control or program like weight watchers) and exercise building up to or more than 30 minutes 5 days per week including some aerobic activity    

## 2020-01-24 NOTE — Assessment & Plan Note (Signed)
Dong better/now retired with less stress

## 2020-01-24 NOTE — Progress Notes (Signed)
Subjective:    Patient ID: Michele Cruz, female    DOB: 02-07-1956, 64 y.o.   MRN: [id number]  This visit occurred during the SARS-CoV-2 public health emergency.  Safety protocols were in place, including screening questions prior to the visit, additional usage of staff PPE, and extensive cleaning of exam room while observing appropriate contact time as indicated for disinfecting solutions.    HPI Here for health maintenance exam and to review chronic medical problems   Wt Readings from Last 3 Encounters:  01/24/20 221 lb 9 oz (100.5 kg)  03/02/19 215 lb (97.5 kg)  02/16/19 218 lb (98.9 kg)   35.23 kg/m  Has been feeling good   Had cataract surgery  Narrow angle (special case)  Vision is much better   Pap 6/18 -negative  Menopausal  no gyn problems  No new partners  Ok to wait a year for pap   Mammogram 7/20 Self breast exam -no lumps or changes   Td 10/03- will update /not around babies   Colonoscopy 9/20 -7 y recall   covid immunized Flu shot -gets in the fall  Zoster status - had her shingrix   BP Readings from Last 3 Encounters:  01/24/20 (!) 110/56  03/02/19 113/68  01/19/19 118/68   Pulse Readings from Last 3 Encounters:  01/24/20 83  03/02/19 69  01/19/19 72    Hyperlipidemia Lab Results  Component Value Date   CHOL 185 01/20/2020   CHOL 175 01/14/2019   CHOL 152 03/01/2018   Lab Results  Component Value Date   HDL 69.40 01/20/2020   HDL 69.50 01/14/2019   HDL 66.20 03/01/2018   Lab Results  Component Value Date   LDLCALC 98 01/20/2020   LDLCALC 93 01/14/2019   LDLCALC 75 03/01/2018   Lab Results  Component Value Date   TRIG 89.0 01/20/2020   TRIG 59.0 01/14/2019   TRIG 55.0 03/01/2018   Lab Results  Component Value Date   CHOLHDL 3 01/20/2020   CHOLHDL 3 01/14/2019   CHOLHDL 2 03/01/2018   Lab Results  Component Value Date   LDLDIRECT 152.4 03/27/2011   LDLDIRECT 150.6 03/20/2010   LDLDIRECT 150.4 12/11/2009  crestor  and diet Tolerates well    Patient Active Problem List   Diagnosis Date Noted  . Colon cancer screening 01/19/2019  . Total knee replacement status 07/28/2018  . 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  . Internal hemorrhoids 07/21/2017  . Hydradenitis 07/21/2017  . Obesity (BMI 30-39.9) 12/09/2016  . Encounter for routine gynecological examination 01/10/2014  . Primary osteoarthritis of right knee 06/21/2012  . Routine general medical examination at a health care facility 03/25/2011  . HYPERCHOLESTEROLEMIA 12/11/2009  . COLONIC POLYPS, HX OF 08/03/2009  . HERNIATED CERVICAL DISC 06/20/2009  . Herniated cervical disc 06/20/2009  . BACK PAIN, LUMBAR, WITH RADICULOPATHY 10/13/2008  . Generalized anxiety disorder 05/24/2007  . ALLERGIC RHINITIS 05/24/2007  . ROSACEA 05/24/2007  . CARDIAC MURMUR 05/24/2007   Past Medical History:  Diagnosis Date  . Allergic rhinitis   . Anemia, iron deficiency   . Anxiety   . Arthritis   . Asthma    no symptoms last 25 years  . Colon polyps   . Family history of adverse reaction to anesthesia    sister PONV  . Heart murmur   . Heavy menses   . Hemorrhoids   . Hidradenitis suppurativa   . History of exercise stress test 06/1999   neg  . History of  MRI 2003   neg per patient  . History of MRI 2007   right quad fatty defect  . History of MRI of spine 03/2004   C-5, C5-6 disk protrusion  . Hyperlipidemia    LDL  . Rosacea   . Spondylosis    deg lumbar disc dz  . Wisdom teeth removed   . Zoster    Past Surgical History:  Procedure Laterality Date  . BREAST CYST ASPIRATION  03/2002  . BREAST CYST ASPIRATION  03/2004   left  . CESAREAN SECTION    . COLONOSCOPY  01/26/2012  . KNEE ARTHROPLASTY Left 03/15/2018   Procedure: COMPUTER ASSISTED TOTAL KNEE ARTHROPLASTY;  Surgeon: Dereck Leep, MD;  Location: [facility] ORS;  Service: Orthopedics;  Laterality: Left;  . KNEE ARTHROPLASTY Right 07/28/2018   Procedure: COMPUTER  ASSISTED TOTAL KNEE ARTHROPLASTY;  Surgeon: Dereck Leep, MD;  Location: [facility] ORS;  Service: Orthopedics;  Laterality: Right;  . KNEE SURGERY     left   Social History   Tobacco Use  . Smoking status: Never Smoker  . Smokeless tobacco: Never Used  Vaping Use  . Vaping Use: Never used  Substance Use Topics  . Alcohol use: Yes    Alcohol/week: 4.0 standard drinks    Types: 2 Glasses of wine, 2 Cans of beer per week    Comment: 2-4 glasses wine or beer weekly per pt.  . Drug use: No   Family History  Problem Relation Age of Onset  . Arthritis Mother        OA  . Obesity Mother   . Diverticulitis Mother   . Colon polyps Mother   . Heart disease Father        CAD  . Alcohol abuse Father   . Diabetes Father   . Hyperlipidemia Sister   . Diverticulitis Sister   . Diabetes Other   . Diverticulitis Sister   . Colon cancer Neg Hx   . Breast cancer Neg Hx   . Esophageal cancer Neg Hx   . Rectal cancer Neg Hx   . Stomach cancer Neg Hx    Allergies  Allergen Reactions  . Decadron [Dexamethasone] Other (See Comments)    Unsure if related---swelling of throat  . Depo-Medrol [Methylprednisolone Acetate]     Rash after injection, a noted common SE from depo-medrol listed in epocrates and most resources  . Lipitor [Atorvastatin Calcium] Other (See Comments)    Leg cramps and pain  . Meloxicam     Pt felt dizzy potentially from this, but does well on aleve  . Simvastatin     Possible transient memory changes, resolved off medicine. Muscle pain   Current Outpatient Medications on File Prior to Visit  Medication Sig Dispense Refill  . amoxicillin (AMOXIL) 500 MG capsule TAKE 4 CAPSULES BY MOUTH AN HOUR BEFORE DENTAL WORK 4 capsule 3  . COLLAGEN PO Take by mouth daily. BioTRUST Multi-collagen, 10 g bioactive daily    . Fluocinolone Acetonide 0.01 % OIL Place 1 drop into both ears as needed.     . hydrocortisone (ANUSOL-HC) 25 MG suppository Place 1 suppository (25 mg total)  rectally at bedtime. (Patient taking differently: Place 25 mg rectally as needed. ) 7 suppository 3  . loratadine (CLARITIN) 10 MG tablet Take 10 mg by mouth daily.    . Magnesium Oxide 250 MG TABS Take 250 mg by mouth daily.     . Misc Natural Products (TURMERIC CURCUMIN) CAPS Take 1 capsule by  mouth daily. Theracurmin    . Omega-3 Fatty Acids (FISH OIL) 1200 MG CAPS Take 1,200 mg by mouth daily.      No current facility-administered medications on file prior to visit.      Review of Systems  Constitutional: Negative for activity change, appetite change, fatigue, fever and unexpected weight change.  HENT: Negative for congestion, ear pain, rhinorrhea, sinus pressure and sore throat.   Eyes: Negative for pain, redness and visual disturbance.  Respiratory: Negative for cough, shortness of breath and wheezing.   Cardiovascular: Negative for chest pain and palpitations.  Gastrointestinal: Negative for abdominal pain, blood in stool, constipation and diarrhea.  Endocrine: Negative for polydipsia and polyuria.  Genitourinary: Negative for dysuria, frequency and urgency.  Musculoskeletal: Negative for arthralgias, back pain and myalgias.  Skin: Negative for pallor and rash.  Allergic/Immunologic: Negative for environmental allergies.  Neurological: Negative for dizziness, syncope and headaches.  Hematological: Negative for adenopathy. Does not bruise/bleed easily.  Psychiatric/Behavioral: Negative for decreased concentration and dysphoric mood. The patient is not nervous/anxious.        Objective:   Physical Exam Constitutional:      General: She is not in acute distress.    Appearance: Normal appearance. She is well-developed. She is obese. She is not ill-appearing or diaphoretic.  HENT:     Head: Normocephalic and atraumatic.     Right Ear: Tympanic membrane, ear canal and external ear normal.     Left Ear: Tympanic membrane, ear canal and external ear normal.     Nose: Nose normal.  No congestion.     Mouth/Throat:     Mouth: Mucous membranes are moist.     Pharynx: Oropharynx is clear. No posterior oropharyngeal erythema.  Eyes:     General: No scleral icterus.    Extraocular Movements: Extraocular movements intact.     Conjunctiva/sclera: Conjunctivae normal.     Pupils: Pupils are equal, round, and reactive to light.  Neck:     Thyroid: No thyromegaly.     Vascular: No carotid bruit or JVD.  Cardiovascular:     Rate and Rhythm: Normal rate and regular rhythm.     Pulses: Normal pulses.     Heart sounds: Normal heart sounds. No gallop.   Pulmonary:     Effort: Pulmonary effort is normal. No respiratory distress.     Breath sounds: Normal breath sounds. No wheezing.     Comments: Good air exch Chest:     Chest wall: No tenderness.  Abdominal:     General: Bowel sounds are normal. There is no distension or abdominal bruit.     Palpations: Abdomen is soft. There is no mass.     Tenderness: There is no abdominal tenderness.     Hernia: No hernia is present.  Genitourinary:    Comments: Breast exam: No mass, nodules, thickening, tenderness, bulging, retraction, inflamation, nipple discharge or skin changes noted.  No axillary or clavicular LA.     Musculoskeletal:        General: No tenderness. Normal range of motion.     Cervical back: Normal range of motion and neck supple. No rigidity. No muscular tenderness.     Right lower leg: No edema.     Left lower leg: No edema.     Comments: No kyphosis   Lymphadenopathy:     Cervical: No cervical adenopathy.  Skin:    General: Skin is warm and dry.     Coloration: Skin is not pale.  Findings: No erythema or rash.  Neurological:     Mental Status: She is alert. Mental status is at baseline.     Cranial Nerves: No cranial nerve deficit.     Motor: No abnormal muscle tone.     Coordination: Coordination normal.     Gait: Gait normal.     Deep Tendon Reflexes: Reflexes are normal and symmetric. Reflexes  normal.  Psychiatric:        Mood and Affect: Mood normal.        Cognition and Memory: Cognition and memory normal.           Assessment & Plan:   Problem List Items Addressed This Visit      Other   HYPERCHOLESTEROLEMIA    Disc goals for lipids and reasons to control them Rev last labs with pt Rev low sat fat diet in detail Well controlled with crestor and diet       Relevant Medications   rosuvastatin (CRESTOR) 5 MG tablet   Generalized anxiety disorder    Dong better/now retired with less stress      Routine general medical examination at a health care facility - Primary    Reviewed health habits including diet and exercise and skin cancer prevention Reviewed appropriate screening tests for age  Also reviewed health mt list, fam hx and immunization status , as well as social and family history   See HPI Labs reviewed  Given phone number to schedule her mammogram Update Td immunization today  covid immunized  Has also had shingrix  Pap neg 6/18- will wait a year to repeat this (no past or current gyn problems) Enc healthy diet and exercise          Obesity (BMI 30-39.9)    Discussed how this problem influences overall health and the risks it imposes  Reviewed plan for weight loss with lower calorie diet (via better food choices and also portion control or program like weight watchers) and exercise building up to or more than 30 minutes 5 days per week including some aerobic activity          Other Visit Diagnoses    Need for Td vaccine       Relevant Orders   Td vaccine greater than or equal to 7yo preservative free IM (Completed)

## 2020-01-24 NOTE — Patient Instructions (Addendum)
Don't forget to schedule your mammogram at [facility] (I highlighted the phone number)   Td update today   Continue exercising and eating healthy

## 2020-01-24 NOTE — Assessment & Plan Note (Signed)
Disc goals for lipids and reasons to control them Rev last labs with pt Rev low sat fat diet in detail Well controlled with crestor and diet   

## 2020-03-02 ENCOUNTER — Other Ambulatory Visit: Payer: Self-pay | Admitting: Family Medicine

## 2020-03-02 DIAGNOSIS — Z1231 Encounter for screening mammogram for malignant neoplasm of breast: Secondary | ICD-10-CM

## 2020-03-16 ENCOUNTER — Other Ambulatory Visit: Payer: Self-pay

## 2020-03-16 ENCOUNTER — Ambulatory Visit
Admission: RE | Admit: 2020-03-16 | Discharge: 2020-03-16 | Disposition: A | Discharge status: Home / Self Care | Payer: BC Managed Care – PPO | Source: Ambulatory Visit | Attending: Family Medicine | Admitting: Family Medicine

## 2020-03-16 DIAGNOSIS — Z1231 Encounter for screening mammogram for malignant neoplasm of breast: Secondary | ICD-10-CM | POA: Insufficient documentation

## 2020-07-24 DIAGNOSIS — U071 COVID-19: Secondary | ICD-10-CM

## 2020-07-24 HISTORY — DX: COVID-19: U07.1

## 2020-08-02 IMAGING — DX DG KNEE 1-2V PORT*L*
2 series · 2 of 2 positions shown · non-contrast
Comparison: Radiographs January 21, 2017.

CLINICAL DATA: Status post left knee replacement.

EXAM:
PORTABLE LEFT KNEE - 1-2 VIEW

[knee ap]
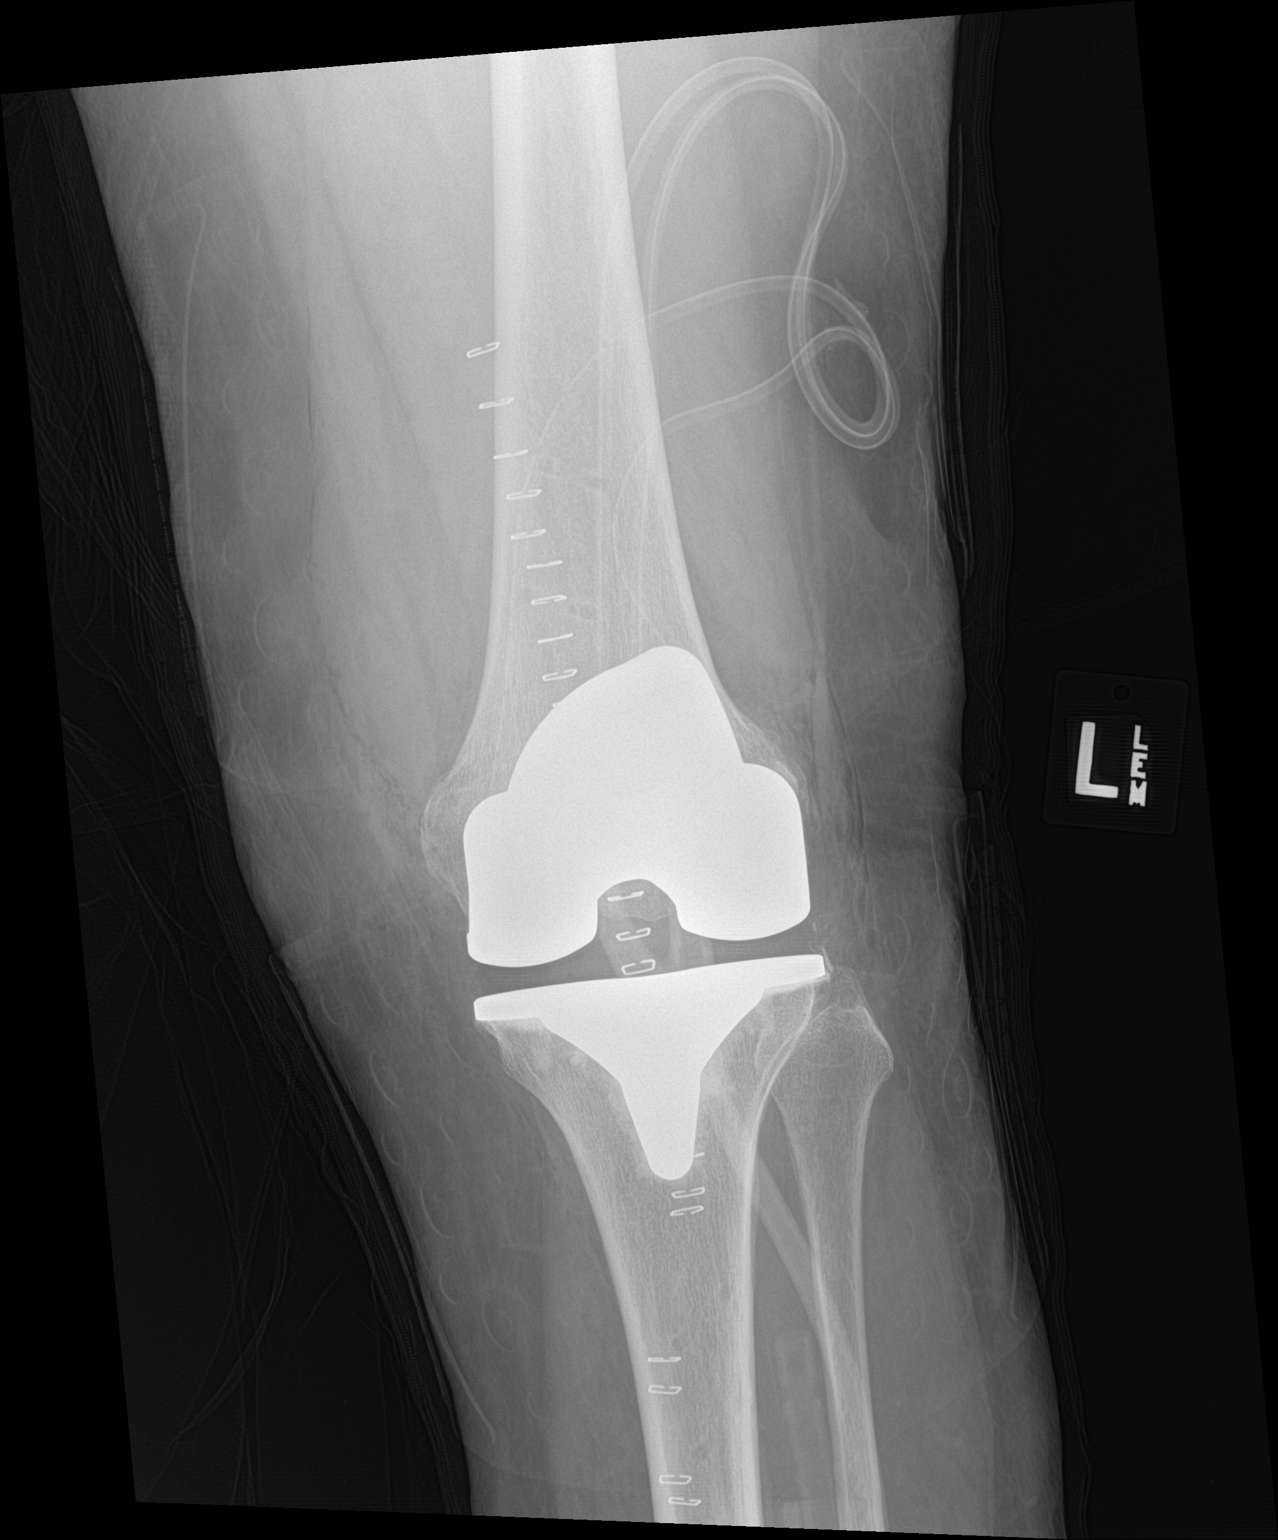

[knee lat]
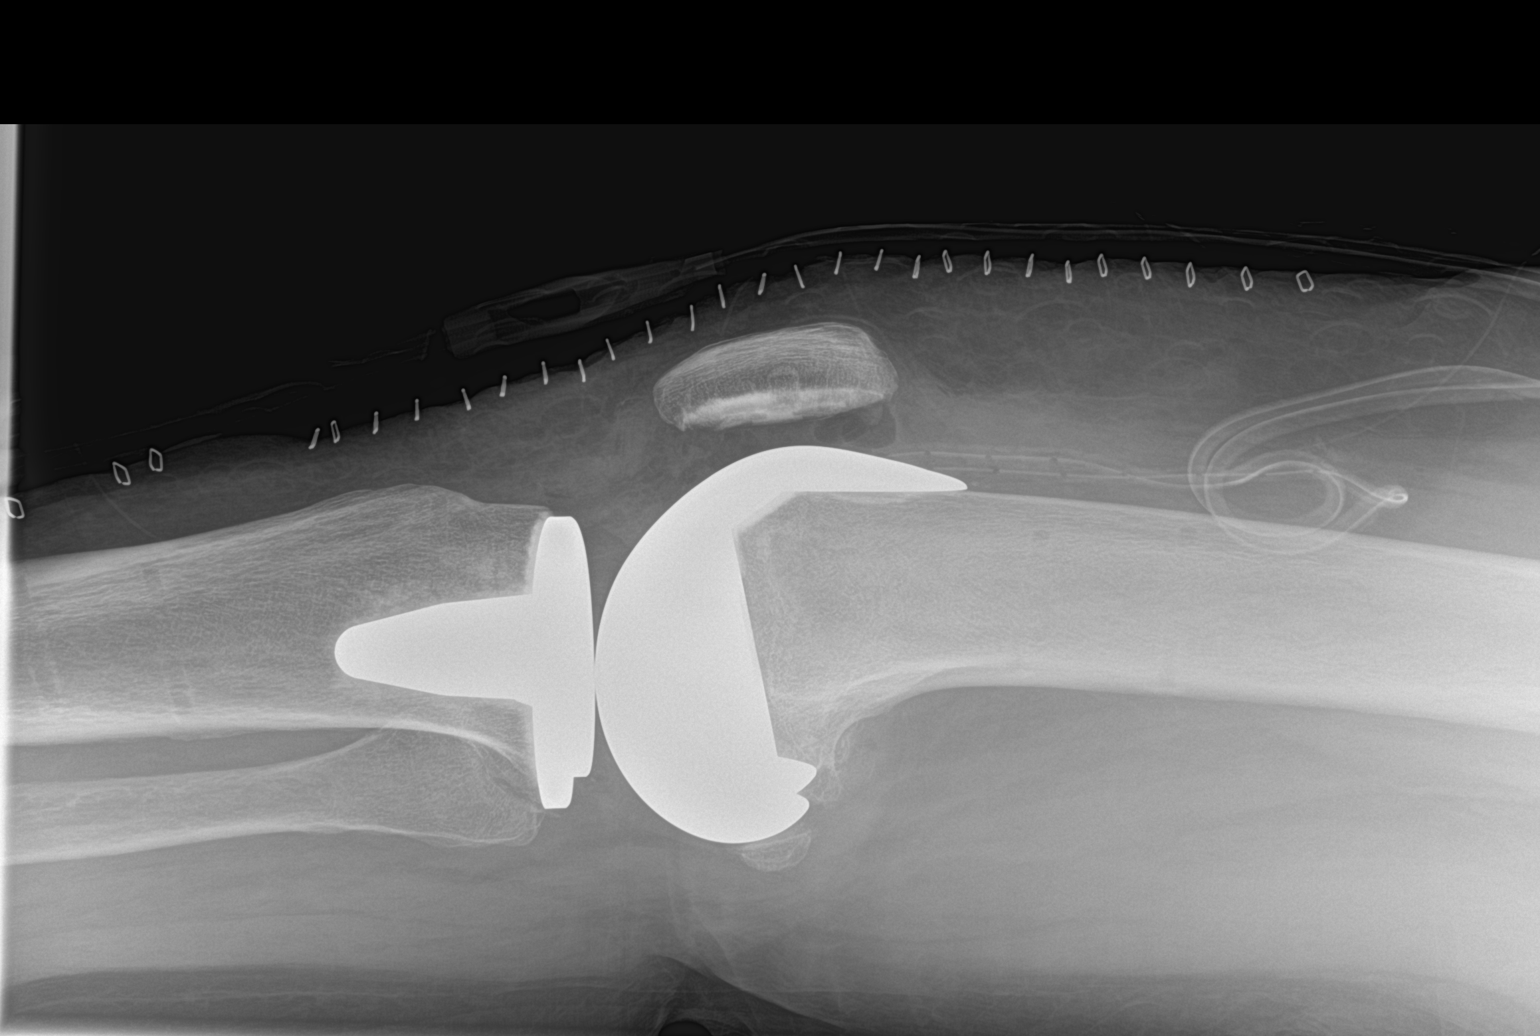

[2 of 2 positions shown; findings below may reference images not displayed]

FINDINGS: The femoral and tibial components appear to be well situated.
Surgical drain is seen in the soft tissues anterior to the distal
femur. No fracture or dislocation is noted.
IMPRESSION: Status post left total knee arthroplasty.

## 2020-08-03 ENCOUNTER — Telehealth: Payer: Self-pay

## 2020-08-03 NOTE — Telephone Encounter (Signed)
Pt went to [facility] for Rapid Covid test and was negative but still has a bad cough. Waiting on PCR test. Asked what to do. I told her we had no availability today. Gave her the info on the [facility] in the area so she could go to one and be seen.

## 2020-08-04 ENCOUNTER — Other Ambulatory Visit: Payer: Self-pay | Admitting: Physician Assistant

## 2020-08-04 ENCOUNTER — Ambulatory Visit
Admission: RE | Admit: 2020-08-04 | Discharge: 2020-08-04 | Disposition: A | Discharge status: Home / Self Care | Payer: BC Managed Care – PPO | Source: Ambulatory Visit | Attending: Physician Assistant | Admitting: Physician Assistant

## 2020-08-04 ENCOUNTER — Other Ambulatory Visit: Payer: Self-pay

## 2020-08-04 VITALS — BP 162/97 | HR 95 | Temp 98.7°F | Resp 18

## 2020-08-04 DIAGNOSIS — U071 COVID-19: Secondary | ICD-10-CM

## 2020-08-04 MED ORDER — ALBUTEROL SULFATE HFA 108 (90 BASE) MCG/ACT IN AERS: 1.0000 | INHALATION_SPRAY | Freq: Four times a day (QID) | RESPIRATORY_TRACT | 0 refills | Status: DC | PRN

## 2020-08-04 MED ORDER — MOLNUPIRAVIR EUA 200MG CAPSULE: 4.0000 | ORAL_CAPSULE | Freq: Two times a day (BID) | ORAL | 0 refills | Status: AC

## 2020-08-04 MED ORDER — PSEUDOEPH-BROMPHEN-DM 30-2-10 MG/5ML PO SYRP: 5.0000 mL | ORAL_SOLUTION | Freq: Four times a day (QID) | ORAL | 0 refills | Status: DC | PRN

## 2020-08-04 MED FILL — MOLNUPIRAVIR 200 MG CAPS: 200 | 5 days supply | Qty: 40 | Fill #0

## 2020-08-04 MED FILL — ALBUTEROL SULFATE HFA 108 (: 108 (90 BAS | 25 days supply | Qty: 18 | Fill #0

## 2020-08-04 MED FILL — BROMPHENIR-PSEUDOEPHED-DM S: 30-2-10 | 7 days supply | Qty: 118 | Fill #0

## 2020-08-04 NOTE — Discharge Instructions (Addendum)
Recommend Flonase  Take Ibuprofen or Tylenol as needed If you experience shortness of breath return for evaluation Self isolate 5 days from symptom onset and then wear a mask around others for 5 days.  Take medications as prescribed

## 2020-08-04 NOTE — ED Triage Notes (Signed)
Pt here for cough and congestion worse at night; pt just found out she is covid positive; pt sts some tightness in chest

## 2020-08-26 ENCOUNTER — Other Ambulatory Visit: Payer: Self-pay | Admitting: Family Medicine

## 2020-08-27 NOTE — Telephone Encounter (Signed)
Last filled on 08/15/19 (Rx expired), CPE scheduled for 01/23/21

## 2021-01-11 ENCOUNTER — Encounter (INDEPENDENT_AMBULATORY_CARE_PROVIDER_SITE_OTHER): Payer: Self-pay

## 2021-01-15 ENCOUNTER — Telehealth: Payer: Self-pay | Admitting: Family Medicine

## 2021-01-15 DIAGNOSIS — Z0000 Encounter for general adult medical examination without abnormal findings: Secondary | ICD-10-CM

## 2021-01-15 DIAGNOSIS — E7800 Pure hypercholesterolemia, unspecified: Secondary | ICD-10-CM

## 2021-01-15 NOTE — Telephone Encounter (Signed)
-----   Message from Ellamae Sia sent at 12/31/2020  9:52 AM EDT ----- Regarding: Lab orders for Wednesday, 7.27.22 Patient is scheduled for CPX labs, please order future labs, Thanks , Karna Christmas

## 2021-01-16 ENCOUNTER — Other Ambulatory Visit (INDEPENDENT_AMBULATORY_CARE_PROVIDER_SITE_OTHER): Payer: BC Managed Care – PPO

## 2021-01-16 ENCOUNTER — Other Ambulatory Visit: Payer: Self-pay

## 2021-01-16 DIAGNOSIS — E7800 Pure hypercholesterolemia, unspecified: Secondary | ICD-10-CM | POA: Diagnosis not present

## 2021-01-16 DIAGNOSIS — Z0000 Encounter for general adult medical examination without abnormal findings: Secondary | ICD-10-CM

## 2021-01-16 LAB — CBC WITH DIFFERENTIAL/PLATELET
Basophils Absolute: 0 10*3/uL (ref 0.0–0.1)
Basophils Relative: 0.9 % (ref 0.0–3.0)
Eosinophils Absolute: 0.1 10*3/uL (ref 0.0–0.7)
Eosinophils Relative: 2.6 % (ref 0.0–5.0)
HCT: 36.4 % (ref 36.0–46.0)
Hemoglobin: 11.9 g/dL — ABNORMAL LOW (ref 12.0–15.0)
Lymphocytes Relative: 34.6 % (ref 12.0–46.0)
Lymphs Abs: 1.8 10*3/uL (ref 0.7–4.0)
MCHC: 32.7 g/dL (ref 30.0–36.0)
MCV: 81.1 fl (ref 78.0–100.0)
Monocytes Absolute: 0.4 10*3/uL (ref 0.1–1.0)
Monocytes Relative: 8 % (ref 3.0–12.0)
Neutro Abs: 2.8 10*3/uL (ref 1.4–7.7)
Neutrophils Relative %: 53.9 % (ref 43.0–77.0)
Platelets: 218 10*3/uL (ref 150.0–400.0)
RBC: 4.49 Mil/uL (ref 3.87–5.11)
RDW: 16.4 % — ABNORMAL HIGH (ref 11.5–15.5)
WBC: 5.2 10*3/uL (ref 4.0–10.5)

## 2021-01-16 LAB — LIPID PANEL
Cholesterol: 180 mg/dL (ref 0–200)
HDL: 63.4 mg/dL (ref >39.00)
LDL Cholesterol: 100 mg/dL — ABNORMAL HIGH (ref 0–99)
NonHDL: 116.36
Total CHOL/HDL Ratio: 3
Triglycerides: 83 mg/dL (ref 0.0–149.0)
VLDL: 16.6 mg/dL (ref 0.0–40.0)

## 2021-01-16 LAB — COMPREHENSIVE METABOLIC PANEL
ALT: 17 U/L (ref 0–35)
AST: 22 U/L (ref 0–37)
Albumin: 4.2 g/dL (ref 3.5–5.2)
Alkaline Phosphatase: 59 U/L (ref 39–117)
BUN: 16 mg/dL (ref 6–23)
CO2: 28 mEq/L (ref 19–32)
Calcium: 9.2 mg/dL (ref 8.4–10.5)
Chloride: 105 mEq/L (ref 96–112)
Creatinine, Ser: 0.78 mg/dL (ref 0.40–1.20)
GFR: 80.21 mL/min (ref >60.00)
Glucose, Bld: 95 mg/dL (ref 70–99)
Potassium: 4.5 mEq/L (ref 3.5–5.1)
Sodium: 139 mEq/L (ref 135–145)
Total Bilirubin: 0.7 mg/dL (ref 0.2–1.2)
Total Protein: 6.4 g/dL (ref 6.0–8.3)

## 2021-01-16 LAB — TSH: TSH: 5.14 u[IU]/mL (ref 0.35–5.50)

## 2021-01-23 ENCOUNTER — Other Ambulatory Visit: Payer: Self-pay

## 2021-01-23 ENCOUNTER — Ambulatory Visit (INDEPENDENT_AMBULATORY_CARE_PROVIDER_SITE_OTHER): Payer: BC Managed Care – PPO | Admitting: Family Medicine

## 2021-01-23 ENCOUNTER — Other Ambulatory Visit (HOSPITAL_COMMUNITY)
Admission: RE | Admit: 2021-01-23 | Discharge: 2021-01-23 | Disposition: A | Discharge status: Home / Self Care | Payer: BC Managed Care – PPO | Source: Ambulatory Visit | Attending: Family Medicine | Admitting: Family Medicine

## 2021-01-23 ENCOUNTER — Encounter: Payer: Self-pay | Admitting: Family Medicine

## 2021-01-23 VITALS — BP 128/76 | HR 82 | Temp 97.7°F | Ht 67.0 in | Wt 220.0 lb

## 2021-01-23 DIAGNOSIS — Z0000 Encounter for general adult medical examination without abnormal findings: Secondary | ICD-10-CM

## 2021-01-23 DIAGNOSIS — Z01419 Encounter for gynecological examination (general) (routine) without abnormal findings: Secondary | ICD-10-CM

## 2021-01-23 DIAGNOSIS — E7800 Pure hypercholesterolemia, unspecified: Secondary | ICD-10-CM

## 2021-01-23 DIAGNOSIS — E669 Obesity, unspecified: Secondary | ICD-10-CM | POA: Diagnosis not present

## 2021-01-23 DIAGNOSIS — Z1211 Encounter for screening for malignant neoplasm of colon: Secondary | ICD-10-CM

## 2021-01-23 MED ORDER — ROSUVASTATIN CALCIUM 5 MG PO TABS
2.5000 mg | ORAL_TABLET | Freq: Every day | ORAL | 11 refills | Status: DC
Start: 2021-01-23 — End: 2022-01-24

## 2021-01-23 NOTE — Assessment & Plan Note (Signed)
Nl exam with no gyn complaints Pap obtained  Mammogram due in sept-pt will schedule  Enc self breast exams

## 2021-01-23 NOTE — Patient Instructions (Addendum)
Stay on vitamin D all year   For cholesterol Avoid red meat/ fried foods/ egg yolks/ fatty breakfast meats/ butter, cheese and high fat dairy/ and shellfish  For general health and healthy weight  Try to get most of your carbohydrates from produce (with the exception of white potatoes)  Eat less bread/pasta/rice/snack foods/cereals/sweets and other items from the middle of the grocery store (processed carbs)  Pap today

## 2021-01-23 NOTE — Assessment & Plan Note (Signed)
Colonoscopy 9/20 with 7 y recall

## 2021-01-23 NOTE — Assessment & Plan Note (Signed)
Disc goals for lipids and reasons to control them Rev last labs with pt Rev low sat fat diet in detail LDL stable at 100  Diet is fair  Plan to continue crestor 2.5 mg daily which she tolerates

## 2021-01-23 NOTE — Progress Notes (Signed)
Subjective:    Patient ID: Michele Cruz, female    DOB: [birth year], 65 y.o.   MRN: [id number]  This visit occurred during the SARS-CoV-2 public health emergency.  Safety protocols were in place, including screening questions prior to the visit, additional usage of staff PPE, and extensive cleaning of exam room while observing appropriate contact time as indicated for disinfecting solutions.   HPI Here for health maintenance exam and to review chronic medical problems    Wt Readings from Last 3 Encounters:  01/23/21 220 lb (99.8 kg)  01/24/20 221 lb 9 oz (100.5 kg)  03/02/19 215 lb (97.5 kg)   34.46 kg/m Still goes to the gym and walking  Taking care of herself   Martin Majestic out [place] twice -[place] and bus tour   Pap 6/18-normal-will do today  No gyn problems at all   Flu shot- gets in the fall Covid vaccinated with booster (had covid in February)  8/21 Td Had shingrix vaccine    Mammogram 9/21 Self breast exam = no lumps   Ca and D intake - took vit D for months/ stopped for the summer  Eats a lot of dairy    Colonoscopy 9/20 with 7 y recall  BP Readings from Last 3 Encounters:  01/23/21 128/76  08/04/20 (!) 162/97  01/24/20 (!) 110/56   Pulse Readings from Last 3 Encounters:  01/23/21 82  08/04/20 95  01/24/20 83    Mood - since retirement mood is much better   Hyperlipidemia Lab Results  Component Value Date   CHOL 180 01/16/2021   CHOL 185 01/20/2020   CHOL 175 01/14/2019   Lab Results  Component Value Date   HDL 63.40 01/16/2021   HDL 69.40 01/20/2020   HDL 69.50 01/14/2019   Lab Results  Component Value Date   LDLCALC 100 (H) 01/16/2021   LDLCALC 98 01/20/2020   LDLCALC 93 01/14/2019   Lab Results  Component Value Date   TRIG 83.0 01/16/2021   TRIG 89.0 01/20/2020   TRIG 59.0 01/14/2019   Lab Results  Component Value Date   CHOLHDL 3 01/16/2021   CHOLHDL 3 01/20/2020   CHOLHDL 3 01/14/2019   Lab Results  Component Value Date    LDLDIRECT 152.4 03/27/2011   LDLDIRECT 150.6 03/20/2010   LDLDIRECT 150.4 12/11/2009   Taking crestor 2.5 mg daily -tolerates well  Eats fair- too many calories perhaps  Watches fats (harder when traveling)    Other labs Results for orders placed or performed in visit on 01/16/21  TSH  Result Value Ref Range   TSH 5.14 0.35 - 5.50 uIU/mL  Lipid panel  Result Value Ref Range   Cholesterol 180 0 - 200 mg/dL   Triglycerides 83.0 0.0 - 149.0 mg/dL   HDL 63.40 >39.00 mg/dL   VLDL 16.6 0.0 - 40.0 mg/dL   LDL Cholesterol 100 (H) 0 - 99 mg/dL   Total CHOL/HDL Ratio 3    NonHDL 116.36   Comprehensive metabolic panel  Result Value Ref Range   Sodium 139 135 - 145 mEq/L   Potassium 4.5 3.5 - 5.1 mEq/L   Chloride 105 96 - 112 mEq/L   CO2 28 19 - 32 mEq/L   Glucose, Bld 95 70 - 99 mg/dL   BUN 16 6 - 23 mg/dL   Creatinine, Ser 0.78 0.40 - 1.20 mg/dL   Total Bilirubin 0.7 0.2 - 1.2 mg/dL   Alkaline Phosphatase 59 39 - 117 U/L   AST 22 0 -  37 U/L   ALT 17 0 - 35 U/L   Total Protein 6.4 6.0 - 8.3 g/dL   Albumin 4.2 3.5 - 5.2 g/dL   GFR 80.21 >60.00 mL/min   Calcium 9.2 8.4 - 10.5 mg/dL  CBC with Differential/Platelet  Result Value Ref Range   WBC 5.2 4.0 - 10.5 K/uL   RBC 4.49 3.87 - 5.11 Mil/uL   Hemoglobin 11.9 (L) 12.0 - 15.0 g/dL   HCT 36.4 36.0 - 46.0 %   MCV 81.1 78.0 - 100.0 fl   MCHC 32.7 30.0 - 36.0 g/dL   RDW 16.4 (H) 11.5 - 15.5 %   Platelets 218.0 150.0 - 400.0 K/uL   Neutrophils Relative % 53.9 43.0 - 77.0 %   Lymphocytes Relative 34.6 12.0 - 46.0 %   Monocytes Relative 8.0 3.0 - 12.0 %   Eosinophils Relative 2.6 0.0 - 5.0 %   Basophils Relative 0.9 0.0 - 3.0 %   Neutro Abs 2.8 1.4 - 7.7 K/uL   Lymphs Abs 1.8 0.7 - 4.0 K/uL   Monocytes Absolute 0.4 0.1 - 1.0 K/uL   Eosinophils Absolute 0.1 0.0 - 0.7 K/uL   Basophils Absolute 0.0 0.0 - 0.1 K/uL   Hb is lower than usual Donated blood prior   Patient Active Problem List   Diagnosis Date Noted   Colon  cancer screening 01/19/2019   Total knee replacement status 07/28/2018   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   Internal hemorrhoids 07/21/2017   Hydradenitis 07/21/2017   Obesity (BMI 30-39.9) 12/09/2016   Encounter for routine gynecological examination 01/10/2014   Primary osteoarthritis of right knee 06/21/2012   Routine general medical examination at a health care facility 03/25/2011   HYPERCHOLESTEROLEMIA 12/11/2009   COLONIC POLYPS, HX OF 08/03/2009   HERNIATED CERVICAL DISC 06/20/2009   Herniated cervical disc 06/20/2009   BACK PAIN, LUMBAR, WITH RADICULOPATHY 10/13/2008   Generalized anxiety disorder 05/24/2007   ALLERGIC RHINITIS 05/24/2007   ROSACEA 05/24/2007   CARDIAC MURMUR 05/24/2007   Past Medical History:  Diagnosis Date   Allergic rhinitis    Anemia, iron deficiency    Anxiety    Arthritis    Asthma    no symptoms last 25 years   Colon polyps    Family history of adverse reaction to anesthesia    sister PONV   Heart murmur    Heavy menses    Hemorrhoids    Hidradenitis suppurativa    History of exercise stress test 06/1999   neg   History of MRI 2003   neg per patient   History of MRI 2007   right quad fatty defect   History of MRI of spine 03/2004   C-5, C5-6 disk protrusion   Hyperlipidemia    LDL   Rosacea    Spondylosis    deg lumbar disc dz   Wisdom teeth removed    Zoster    Past Surgical History:  Procedure Laterality Date   BREAST CYST ASPIRATION  03/2002   BREAST CYST ASPIRATION  03/2004   left   CESAREAN SECTION     COLONOSCOPY  01/26/2012   KNEE ARTHROPLASTY Left 03/15/2018   Procedure: COMPUTER ASSISTED TOTAL KNEE ARTHROPLASTY;  Surgeon: Dereck Leep, MD;  Location: [facility];  Service: Orthopedics;  Laterality: Left;   KNEE ARTHROPLASTY Right 07/28/2018   Procedure: COMPUTER ASSISTED TOTAL KNEE ARTHROPLASTY;  Surgeon: Dereck Leep, MD;  Location: [facility];  Service: Orthopedics;  Laterality: Right;   KNEE  SURGERY     left   Social History   Tobacco Use   Smoking status: Never   Smokeless tobacco: Never  Vaping Use   Vaping Use: Never used  Substance Use Topics   Alcohol use: Yes    Alcohol/week: 4.0 standard drinks    Types: 2 Glasses of wine, 2 Cans of beer per week    Comment: 2-4 glasses wine or beer weekly per pt.   Drug use: No   Family History  Problem Relation Age of Onset   Arthritis Mother        OA   Obesity Mother    Diverticulitis Mother    Colon polyps Mother    Heart disease Father        CAD   Alcohol abuse Father    Diabetes Father    Hyperlipidemia Sister    Diverticulitis Sister    Diabetes Other    Diverticulitis Sister    Colon cancer Neg Hx    Breast cancer Neg Hx    Esophageal cancer Neg Hx    Rectal cancer Neg Hx    Stomach cancer Neg Hx    Allergies  Allergen Reactions   Decadron [Dexamethasone] Other (See Comments)    Unsure if related---swelling of throat   Depo-Medrol [Methylprednisolone Acetate]     Rash after injection, a noted common SE from depo-medrol listed in epocrates and most resources   Lipitor [Atorvastatin Calcium] Other (See Comments)    Leg cramps and pain   Meloxicam     Pt felt dizzy potentially from this, but does well on aleve   Simvastatin     Possible transient memory changes, resolved off medicine. Muscle pain   Current Outpatient Medications on File Prior to Visit  Medication Sig Dispense Refill   amoxicillin (AMOXIL) 500 MG capsule TAKE 4 CAPSULES BY MOUTH AN HOUR BEFORE DENTAL WORK 4 capsule 3   COLLAGEN PO Take by mouth daily. BioTRUST Multi-collagen, 10 g bioactive daily     Fluocinolone Acetonide 0.01 % OIL Place 1 drop into both ears as needed.      hydrocortisone (ANUSOL-HC) 25 MG suppository Place 1 suppository (25 mg total) rectally at bedtime. (Patient taking differently: Place 25 mg rectally as needed.) 7 suppository 3   loratadine (CLARITIN) 10 MG tablet Take 10 mg by mouth daily.     Magnesium Oxide  250 MG TABS Take 250 mg by mouth daily.      Misc Natural Products (TURMERIC CURCUMIN) CAPS Take 1 capsule by mouth daily. Theracurmin     Omega-3 Fatty Acids (FISH OIL) 1200 MG CAPS Take 1,200 mg by mouth daily.      No current facility-administered medications on file prior to visit.    Review of Systems  Constitutional:  Negative for activity change, appetite change, fatigue, fever and unexpected weight change.  HENT:  Negative for congestion, ear pain, rhinorrhea, sinus pressure and sore throat.   Eyes:  Negative for pain, redness and visual disturbance.  Respiratory:  Negative for cough, shortness of breath and wheezing.   Cardiovascular:  Negative for chest pain and palpitations.  Gastrointestinal:  Negative for abdominal pain, blood in stool, constipation and diarrhea.  Endocrine: Negative for polydipsia and polyuria.  Genitourinary:  Negative for dysuria, frequency and urgency.  Musculoskeletal:  Negative for arthralgias, back pain and myalgias.  Skin:  Negative for pallor and rash.  Allergic/Immunologic: Negative for environmental allergies.  Neurological:  Negative for dizziness, syncope and headaches.  Hematological:  Negative  for adenopathy. Does not bruise/bleed easily.  Psychiatric/Behavioral:  Negative for decreased concentration and dysphoric mood. The patient is not nervous/anxious.       Objective:   Physical Exam Constitutional:      General: She is not in acute distress.    Appearance: Normal appearance. She is well-developed. She is obese. She is not ill-appearing or diaphoretic.  HENT:     Head: Normocephalic and atraumatic.     Right Ear: Tympanic membrane, ear canal and external ear normal.     Left Ear: Tympanic membrane, ear canal and external ear normal.     Nose: Nose normal. No congestion.     Mouth/Throat:     Mouth: Mucous membranes are moist.     Pharynx: Oropharynx is clear. No posterior oropharyngeal erythema.  Eyes:     General: No scleral  icterus.    Extraocular Movements: Extraocular movements intact.     Conjunctiva/sclera: Conjunctivae normal.     Pupils: Pupils are equal, round, and reactive to light.  Neck:     Thyroid: No thyromegaly.     Vascular: No carotid bruit or JVD.  Cardiovascular:     Rate and Rhythm: Normal rate and regular rhythm.     Pulses: Normal pulses.     Heart sounds: Normal heart sounds.    No gallop.  Pulmonary:     Effort: Pulmonary effort is normal. No respiratory distress.     Breath sounds: Normal breath sounds. No wheezing.     Comments: Good air exch Chest:     Chest wall: No tenderness.  Abdominal:     General: Bowel sounds are normal. There is no distension or abdominal bruit.     Palpations: Abdomen is soft. There is no mass.     Tenderness: There is no abdominal tenderness.     Hernia: No hernia is present.  Genitourinary:    Comments: Breast exam: No mass, nodules, thickening, tenderness, bulging, retraction, inflamation, nipple discharge or skin changes noted.  No axillary or clavicular LA.     Musculoskeletal:        General: No tenderness. Normal range of motion.     Cervical back: Normal range of motion and neck supple. No rigidity. No muscular tenderness.     Right lower leg: No edema.     Left lower leg: No edema.     Comments: No kyphosis  Lymphadenopathy:     Cervical: No cervical adenopathy.  Skin:    General: Skin is warm and dry.     Coloration: Skin is not pale.     Findings: No erythema or rash.     Comments: Solar lentigines diffusely Few sks  Neurological:     Mental Status: She is alert. Mental status is at baseline.     Cranial Nerves: No cranial nerve deficit.     Motor: No abnormal muscle tone.     Coordination: Coordination normal.     Gait: Gait normal.     Deep Tendon Reflexes: Reflexes are normal and symmetric. Reflexes normal.  Psychiatric:        Mood and Affect: Mood normal.        Cognition and Memory: Cognition and memory normal.           Assessment & Plan:   Problem List Items Addressed This Visit       Other   HYPERCHOLESTEROLEMIA    Disc goals for lipids and reasons to control them Rev last labs with pt Rev low  sat fat diet in detail LDL stable at 100  Diet is fair  Plan to continue crestor 2.5 mg daily which she tolerates       Relevant Medications   rosuvastatin (CRESTOR) 5 MG tablet   Routine general medical examination at a health care facility - Primary    Reviewed health habits including diet and exercise and skin cancer prevention Reviewed appropriate screening tests for age  Also reviewed health mt list, fam hx and immunization status , as well as social and family history   See HPI Labs reviewed  Pap and gyn exam today  Mammogram due next month Colonoscopy utd  utd imms, plans covid shot in fhe fall  Taking ca and D        Encounter for routine gynecological examination    Nl exam with no gyn complaints Pap obtained  Mammogram due in sept-pt will schedule  Enc self breast exams       Relevant Orders   Cytology - PAP([place])   Obesity (BMI 30-39.9)    Discussed how this problem influences overall health and the risks it imposes  Reviewed plan for weight loss with lower calorie diet (via better food choices and also portion control or program like weight watchers) and exercise building up to or more than 30 minutes 5 days per week including some aerobic activity          Colon cancer screening    Colonoscopy 9/20 with 7 y recall

## 2021-01-23 NOTE — Assessment & Plan Note (Signed)
Reviewed health habits including diet and exercise and skin cancer prevention Reviewed appropriate screening tests for age  Also reviewed health mt list, fam hx and immunization status , as well as social and family history   See HPI Labs reviewed  Pap and gyn exam today  Mammogram due next month Colonoscopy utd  utd imms, plans covid shot in fhe fall  Taking ca and D

## 2021-01-23 NOTE — Assessment & Plan Note (Signed)
Discussed how this problem influences overall health and the risks it imposes  Reviewed plan for weight loss with lower calorie diet (via better food choices and also portion control or program like weight watchers) and exercise building up to or more than 30 minutes 5 days per week including some aerobic activity    

## 2021-01-27 LAB — CYTOLOGY - PAP
Comment: NEGATIVE
Diagnosis: NEGATIVE
High risk HPV: NEGATIVE

## 2021-05-29 ENCOUNTER — Other Ambulatory Visit: Payer: Self-pay | Admitting: Family Medicine

## 2021-05-29 DIAGNOSIS — Z1231 Encounter for screening mammogram for malignant neoplasm of breast: Secondary | ICD-10-CM

## 2021-06-10 IMAGING — MG DIGITAL SCREENING BILATERAL MAMMOGRAM WITH TOMO AND CAD
8 series · 8 of 24 positions shown · non-contrast
Comparison: Previous exam(s).

CLINICAL DATA: Screening.

EXAM:
DIGITAL SCREENING BILATERAL MAMMOGRAM WITH TOMO AND CAD

[L MLO synth-2D]
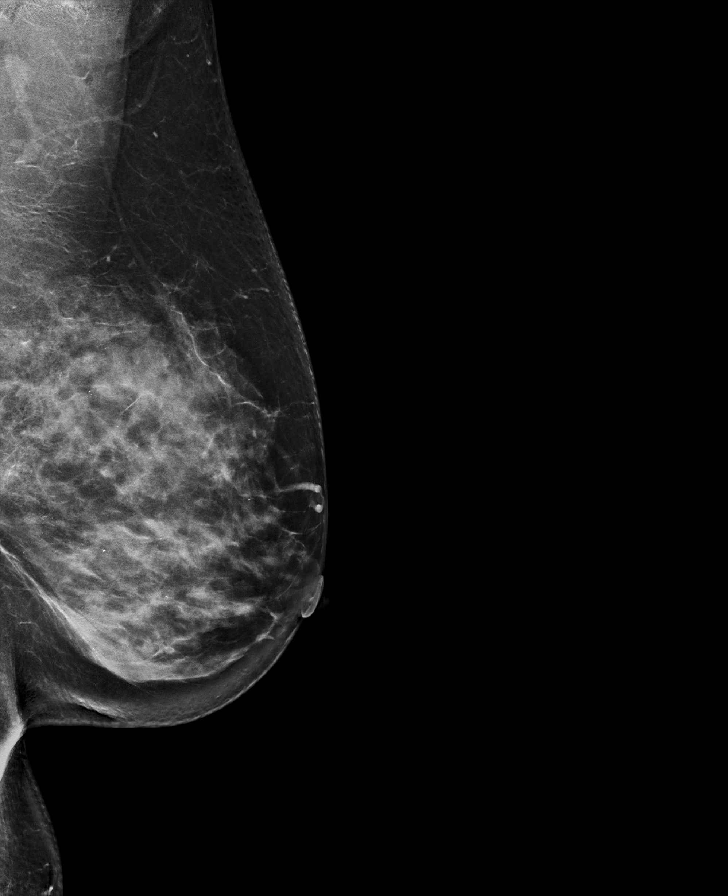

[R MLO synth-2D]
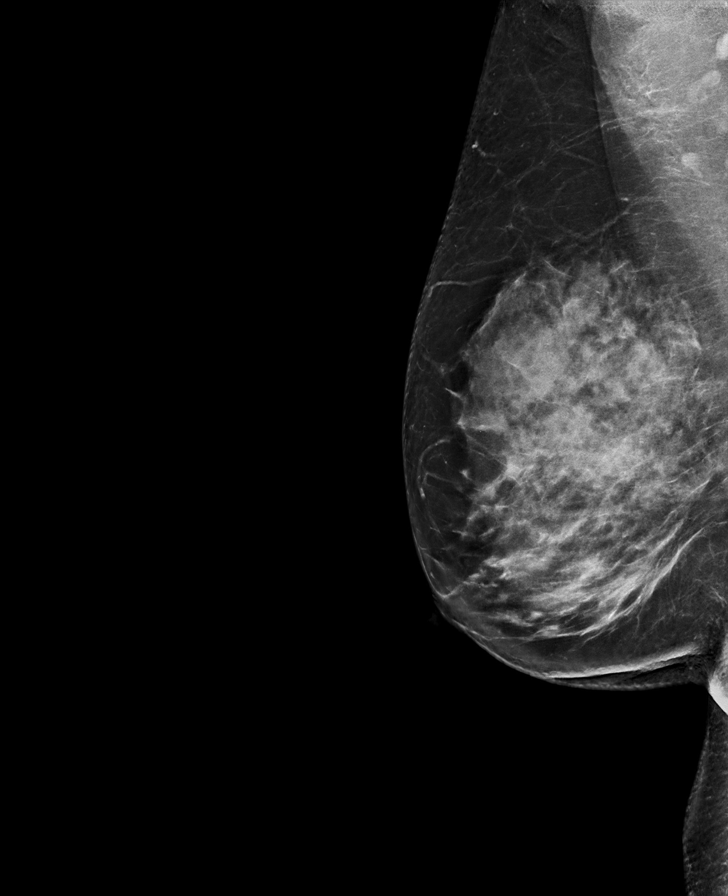

[R CC synth-2D]
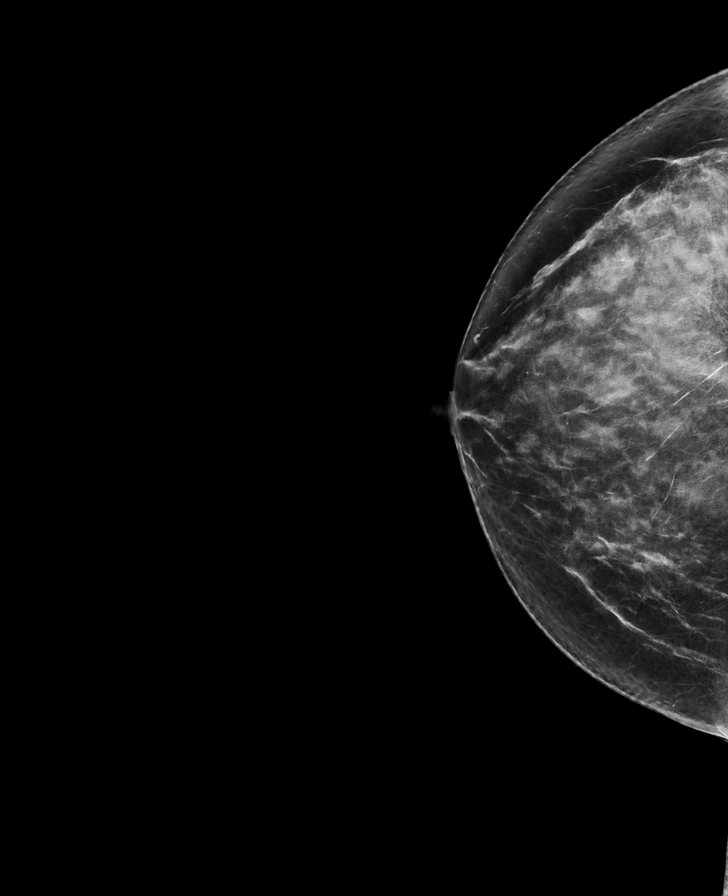

[L CC synth-2D]
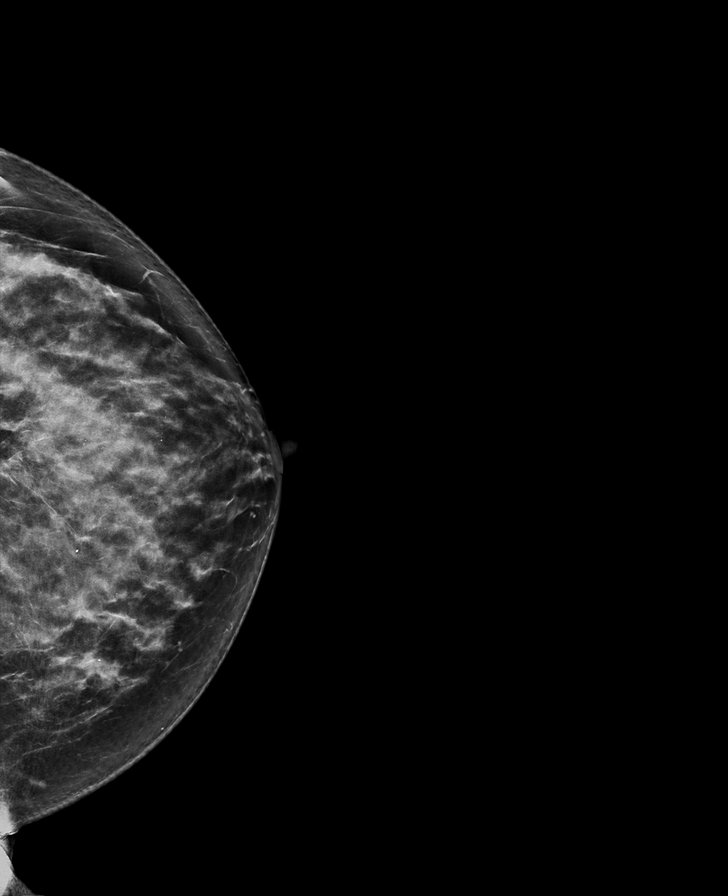

[R CC tomo · tomo slice 39/78.0]
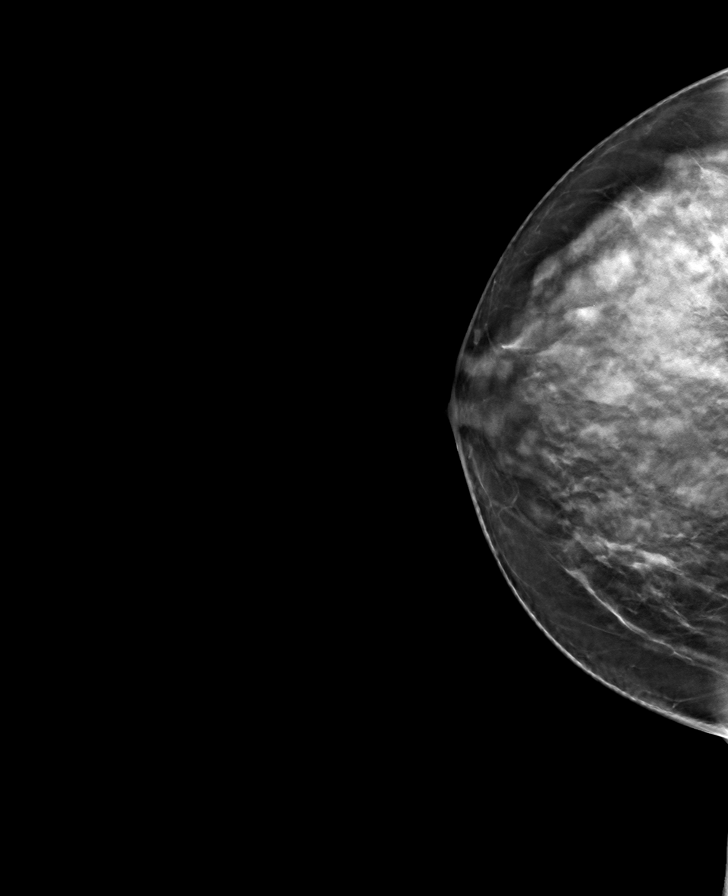

[R MLO tomo · tomo slice 46/91.0]
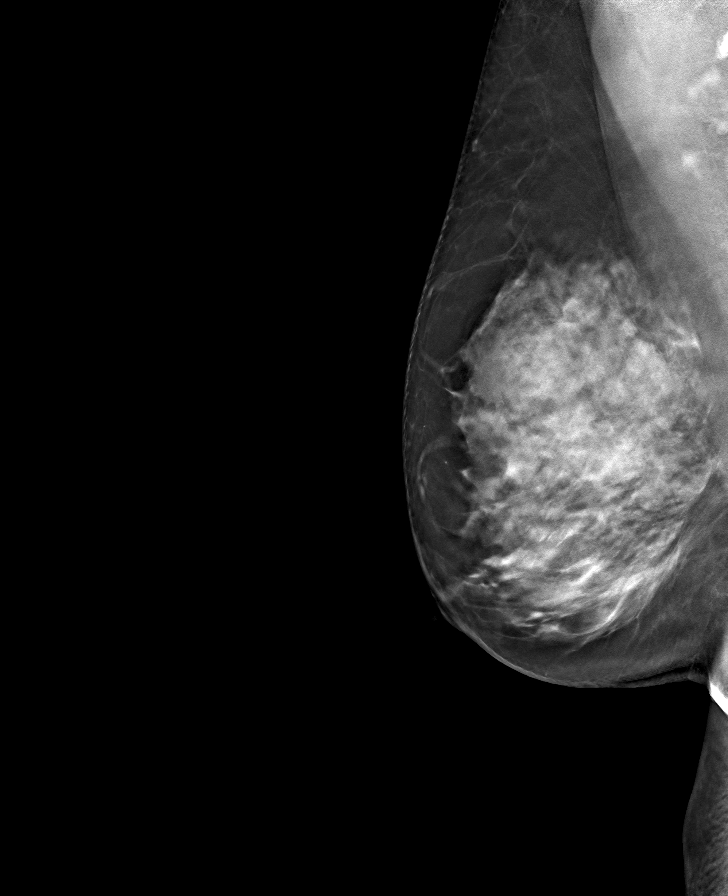

[L CC tomo · tomo slice 41/81.0]
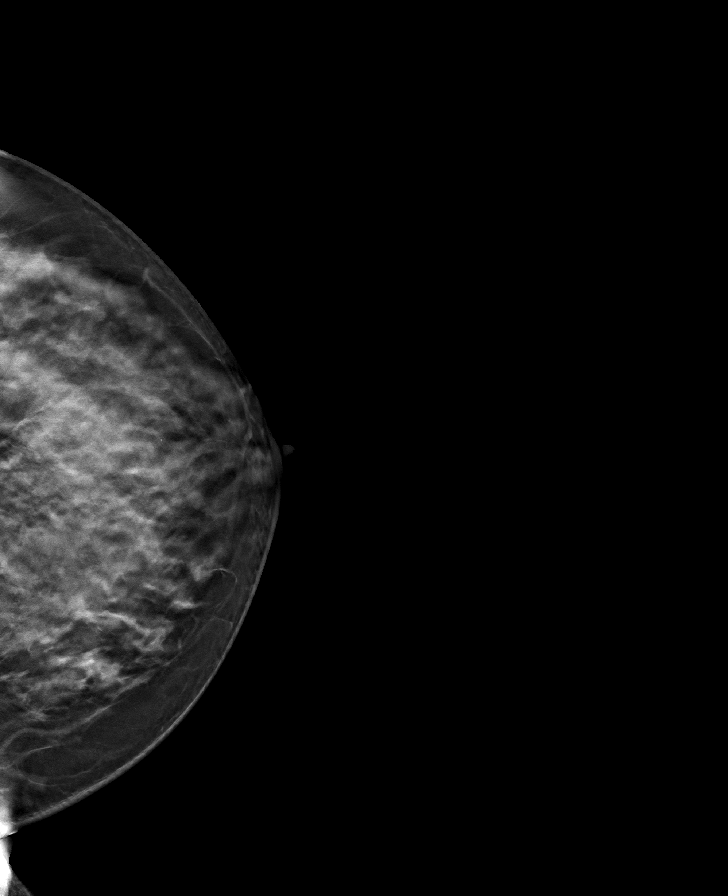

[L MLO tomo · tomo slice 45/90.0]
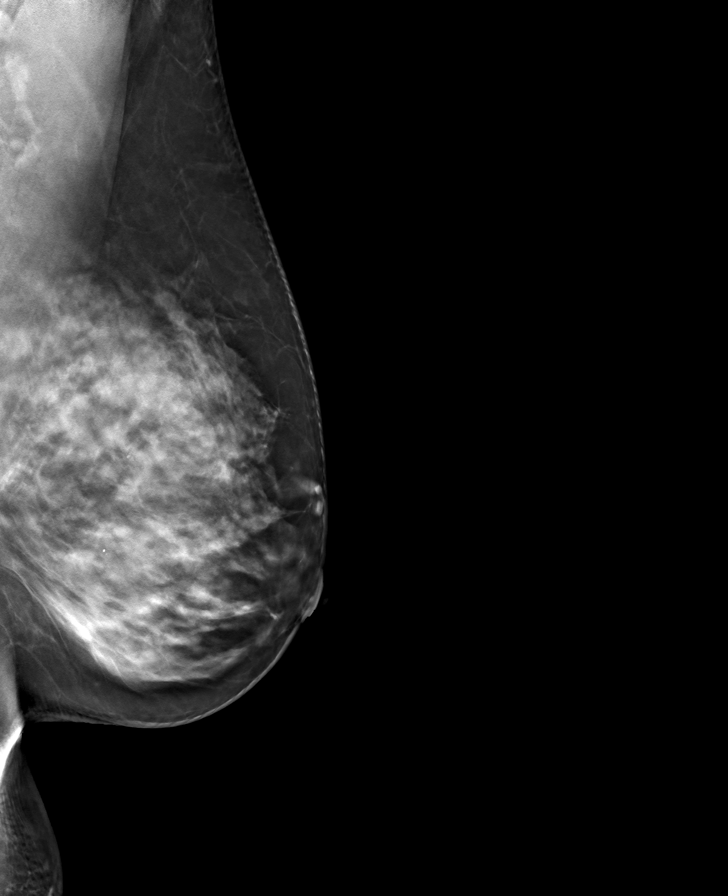

[8 of 24 positions shown; findings below may reference images not displayed]

ACR Breast Density Category c: The breast tissue is heterogeneously
dense, which may obscure small masses.
FINDINGS: There are no findings suspicious for malignancy. Images were
processed with CAD.
IMPRESSION: No mammographic evidence of malignancy. A result letter of this
screening mammogram will be mailed directly to the patient.

RECOMMENDATION:
Screening mammogram in one year. (Code:FT-U-LHB)

BI-RADS CATEGORY  1: Negative.

## 2021-08-13 ENCOUNTER — Ambulatory Visit: Payer: Medicare PPO | Admitting: Internal Medicine

## 2021-08-13 ENCOUNTER — Encounter: Payer: Self-pay | Admitting: Internal Medicine

## 2021-08-13 VITALS — BP 106/70 | HR 70 | Ht 67.0 in | Wt 227.0 lb

## 2021-08-13 DIAGNOSIS — K644 Residual hemorrhoidal skin tags: Secondary | ICD-10-CM

## 2021-08-13 DIAGNOSIS — K648 Other hemorrhoids: Secondary | ICD-10-CM | POA: Diagnosis not present

## 2021-08-13 NOTE — Progress Notes (Signed)
Michele Cruz 66 y.o. [birth year] [id number]  Assessment & Plan:   Encounter Diagnosis  Name Primary?   Internal and external bleeding hemorrhoids Yes    We reviewed her situation.  She is interested in hemorrhoidal ligation so we will plan on having her come back for hemorrhoidal ligation of the internal hemorrhoids to try to improve her quality of life.   Subjective:   Chief Complaint: Hemorrhoids  HPI 66 year old white woman here with a complaints of swelling and intermittent rectal bleeding from hemorrhoids.  Every few months she will have blood dripping into her underwear.  She can have pressure there and 2 weeks ago she passed a few clots.  Bowel habits are regular without change.  She does work out and Scientist, physiological., denies any bronchitis with coughing etc. or sneezing etc.  This has been a relatively chronic problem and they were seen at colonoscopy in 2020.  Colonoscopy September 2020 Two 1 to 2 mm polyps in the transverse colon, removed with a cold biopsy forceps. Resected and retrieved. - Diverticulosis in the sigmoid colon. - External and internal hemorrhoids. - The examination was otherwise normal on direct and retroflexion views. - Personal history of colonic polyp 8 mm adenoma 2008, no polyps 2013.   Allergies  Allergen Reactions   Decadron [Dexamethasone] Other (See Comments)    Unsure if related---swelling of throat   Depo-Medrol [Methylprednisolone Acetate]     Rash after injection, a noted common SE from depo-medrol listed in epocrates and most resources   Lipitor [Atorvastatin Calcium] Other (See Comments)    Leg cramps and pain   Meloxicam     Pt felt dizzy potentially from this, but does well on aleve   Simvastatin     Possible transient memory changes, resolved off medicine. Muscle pain   Current Meds  Medication Sig   amoxicillin (AMOXIL) 500 MG capsule TAKE 4 CAPSULES BY MOUTH AN HOUR BEFORE DENTAL WORK   COLLAGEN PO Take by mouth daily.  BioTRUST Multi-collagen, 10 g bioactive daily   Fluocinolone Acetonide 0.01 % OIL Place 1 drop into both ears as needed.    hydrocortisone (ANUSOL-HC) 25 MG suppository Place 1 suppository (25 mg total) rectally at bedtime. (Patient taking differently: Place 25 mg rectally as needed.)   loratadine (CLARITIN) 10 MG tablet Take 10 mg by mouth daily.   Magnesium Oxide 250 MG TABS Take 250 mg by mouth daily.    Misc Natural Products (TURMERIC CURCUMIN) CAPS Take 1 capsule by mouth daily. Theracurmin   Omega-3 Fatty Acids (FISH OIL) 1200 MG CAPS Take 1,200 mg by mouth daily.    rosuvastatin (CRESTOR) 5 MG tablet Take 0.5 tablets (2.5 mg total) by mouth at bedtime. Take 1/2 tablet   Past Medical History:  Diagnosis Date   Allergic rhinitis    Anemia, iron deficiency    Anxiety    Arthritis    Asthma    no symptoms last 25 years   Colon polyps    Family history of adverse reaction to anesthesia    sister PONV   Heart murmur    Heavy menses    Hemorrhoids    Hidradenitis suppurativa    History of exercise stress test 06/1999   neg   History of MRI 2003   neg per patient   History of MRI 2007   right quad fatty defect   History of MRI of spine 03/2004   C-5, C5-6 disk protrusion   Hyperlipidemia    LDL  Rosacea    Spondylosis    deg lumbar disc dz   Wisdom teeth removed    Zoster    Past Surgical History:  Procedure Laterality Date   BREAST CYST ASPIRATION  03/2002   BREAST CYST ASPIRATION  03/2004   left   CATARACT EXTRACTION     CESAREAN SECTION     COLONOSCOPY  01/26/2012   KNEE ARTHROPLASTY Left 03/15/2018   Procedure: COMPUTER ASSISTED TOTAL KNEE ARTHROPLASTY;  Surgeon: Dereck Leep, MD;  Location: [facility];  Service: Orthopedics;  Laterality: Left;   KNEE ARTHROPLASTY Right 07/28/2018   Procedure: COMPUTER ASSISTED TOTAL KNEE ARTHROPLASTY;  Surgeon: Dereck Leep, MD;  Location: [facility];  Service: Orthopedics;  Laterality: Right;   KNEE SURGERY     left    Social History   Social History Narrative   One daughter   Daily caffeine; use 1-2 daily   family history includes Alcohol abuse in her father; Arthritis in her mother; Colon polyps in her mother; Diabetes in her father and another family member; Diverticulitis in her mother, sister, and sister; Heart disease in her father; Hyperlipidemia in her sister; Obesity in her mother.   Review of Systems   Objective:   Physical Exam BP 106/70    Pulse 70    Ht 5\' 7"  (1.702 m)    Wt 227 lb (103 kg)    SpO2 98%    BMI 35.55 kg/m   Rovanda Beatty CMA present    DRE - palpable hemorrhoids in canal, nontender brown stool and no mass  Anoscopy Gr 2 internal hemorrhoids w/ stigmata of bleeding

## 2021-08-13 NOTE — Patient Instructions (Signed)
You have been scheduled for your hemorrhoid banding on Friday 08/16/21 at 2:50pm  Please see hemorrhoid handout.   If you are age 65 or older, your body mass index should be between 23-30. Your Body mass index is 35.55 kg/m. If this is out of the aforementioned range listed, please consider follow up with your Primary Care Provider.  If you are age 33 or younger, your body mass index should be between 19-25. Your Body mass index is 35.55 kg/m. If this is out of the aformentioned range listed, please consider follow up with your Primary Care Provider.   ________________________________________________________  The [facility] providers would like to encourage you to use [facility] to communicate with providers for non-urgent requests or questions.  Due to long hold times on the telephone, sending your provider a message by [facility] may be a faster and more efficient way to get a response.  Please allow 48 business hours for a response.  Please remember that this is for non-urgent requests.  _______________________________________________________  Thank you for choosing me and [facility].  Dr.Carl Carlean Purl

## 2021-08-16 ENCOUNTER — Encounter: Payer: Self-pay | Admitting: Internal Medicine

## 2021-08-16 ENCOUNTER — Ambulatory Visit: Payer: Medicare PPO | Admitting: Internal Medicine

## 2021-08-16 VITALS — BP 110/70 | HR 82 | Ht 67.0 in | Wt 227.0 lb

## 2021-08-16 DIAGNOSIS — K644 Residual hemorrhoidal skin tags: Secondary | ICD-10-CM

## 2021-08-16 DIAGNOSIS — K648 Other hemorrhoids: Secondary | ICD-10-CM | POA: Diagnosis not present

## 2021-08-16 NOTE — Progress Notes (Signed)
° ° °  HEMORRHOID LIGATION:  Indication rectal bleeding, previous endoscopy earlier this week revealed internal and external hemorrhoids with grade 2 internal hemorrhoids with most prominent right posterior and left lateral.  Patient desires banding.  Colonoscopy September 2020 with hemorrhoids diverticulosis and diminutive adenomas.  Cardell Peach, CMA present  Digital exam reveals her external hemorrhoid/tags.  No mass.  PROCEDURE NOTE: The patient presents with symptomatic grade 2 hemorrhoids, requesting rubber band ligation of his/her hemorrhoidal disease.  All risks, benefits and alternative forms of therapy were described and informed consent was obtained.   The anorectum was pre-medicated with 0.125% nitroglycerin and 5% lidocaine The decision was made to band the right posterior and left lateral internal hemorrhoid, and the [place] was used to perform band ligation without complication.  Digital anorectal examination was then performed to assure proper positioning of the band, and to adjust the banded tissue as required.  The patient was discharged home without pain or other issues.  Dietary and behavioral recommendations were given and along with follow-up instructions.     The following adjunctive treatments were recommended:  Benefiber 1 tablespoon daily  The patient will return in 2 months for  follow-up and possible additional banding as required.  She may cancel ahead of time if asymptomatic. No complications were encountered and the patient tolerated the procedure well.

## 2021-08-16 NOTE — Patient Instructions (Addendum)
If you are age 65 or older, your body mass index should be between 23-30. Your Body mass index is 35.55 kg/m. If this is out of the aforementioned range listed, please consider follow up with your Primary Care Provider.  If you are age 16 or younger, your body mass index should be between 19-25. Your Body mass index is 35.55 kg/m. If this is out of the aformentioned range listed, please consider follow up with your Primary Care Provider.   ________________________________________________________  The [facility] providers would like to encourage you to use [facility] to communicate with providers for non-urgent requests or questions.  Due to long hold times on the telephone, sending your provider a message by [facility] may be a faster and more efficient way to get a response.  Please allow 48 business hours for a response.  Please remember that this is for non-urgent requests.  _______________________________________________________  Follow up in 8 weeks, if doing well you can cancel follow up.   Please start 1 tablespoon of Benefiber daily in water or juice.   It was a pleasure to see you today!  Thank you for trusting me with your gastrointestinal care!    HEMORRHOID BANDING PROCEDURE    FOLLOW-UP CARE   The procedure you have had should have been relatively painless since the banding of the area involved does not have nerve endings and there is no pain sensation.  The rubber band cuts off the blood supply to the hemorrhoid and the band may fall off as soon as 48 hours after the banding (the band may occasionally be seen in the toilet bowl following a bowel movement). You may notice a temporary feeling of fullness in the rectum which should respond adequately to plain Tylenol or Motrin.  Following the banding, avoid strenuous exercise that evening and resume full activity the next day.  A sitz bath (soaking in a warm tub) or bidet is soothing, and can be useful for cleansing the area after  bowel movements.     To avoid constipation, take two tablespoons of natural wheat bran, natural oat bran, flax, Benefiber or any over the counter fiber supplement and increase your water intake to 7-8 glasses daily.    Unless you have been prescribed anorectal medication, do not put anything inside your rectum for two weeks: No suppositories, enemas, fingers, etc.  Occasionally, you may have more bleeding than usual after the banding procedure.  This is often from the untreated hemorrhoids rather than the treated one.  Dont be concerned if there is a tablespoon or so of blood.  If there is more blood than this, lie flat with your bottom higher than your head and apply an ice pack to the area. If the bleeding does not stop within a half an hour or if you feel faint, call our office at [phone] or go to the emergency room.  Problems are not common; however, if there is a substantial amount of bleeding, severe pain, chills, fever or difficulty passing urine (very rare) or other problems, you should call us at [phone] or report to the nearest emergency room.  Do not stay seated continuously for more than 2-3 hours for a day or two after the procedure.  Tighten your buttock muscles 10-15 times every two hours and take 10-15 deep breaths every 1-2 hours.  Do not spend more than a few minutes on the toilet if you cannot empty your bowel; instead re-visit the toilet at a later time.

## 2021-08-16 NOTE — Assessment & Plan Note (Signed)
Grade 2 internal RP and LL columns banded return in 2 months for reassessment

## 2021-08-21 HISTORY — PX: HEMORRHOID BANDING: SHX5850

## 2021-10-17 ENCOUNTER — Ambulatory Visit: Payer: Medicare PPO | Admitting: Internal Medicine

## 2022-01-16 ENCOUNTER — Telehealth: Payer: Self-pay | Admitting: Family Medicine

## 2022-01-16 DIAGNOSIS — Z0000 Encounter for general adult medical examination without abnormal findings: Secondary | ICD-10-CM

## 2022-01-16 DIAGNOSIS — E7800 Pure hypercholesterolemia, unspecified: Secondary | ICD-10-CM

## 2022-01-16 NOTE — Telephone Encounter (Signed)
-----   Message from Ellamae Sia sent at 01/06/2022 12:06 PM EDT ----- Regarding: Lab orders for Friday, 7.28.23 Patient is scheduled for CPX labs, please order future labs, Thanks , Karna Christmas

## 2022-01-17 ENCOUNTER — Other Ambulatory Visit (INDEPENDENT_AMBULATORY_CARE_PROVIDER_SITE_OTHER): Payer: Medicare PPO

## 2022-01-17 DIAGNOSIS — E7800 Pure hypercholesterolemia, unspecified: Secondary | ICD-10-CM

## 2022-01-17 DIAGNOSIS — Z0000 Encounter for general adult medical examination without abnormal findings: Secondary | ICD-10-CM

## 2022-01-17 LAB — LIPID PANEL
Cholesterol: 198 mg/dL (ref 0–200)
HDL: 59.6 mg/dL (ref >39.00)
LDL Cholesterol: 126 mg/dL — ABNORMAL HIGH (ref 0–99)
NonHDL: 138.54
Total CHOL/HDL Ratio: 3
Triglycerides: 65 mg/dL (ref 0.0–149.0)
VLDL: 13 mg/dL (ref 0.0–40.0)

## 2022-01-17 LAB — COMPREHENSIVE METABOLIC PANEL
ALT: 14 U/L (ref 0–35)
AST: 18 U/L (ref 0–37)
Albumin: 4.2 g/dL (ref 3.5–5.2)
Alkaline Phosphatase: 71 U/L (ref 39–117)
BUN: 17 mg/dL (ref 6–23)
CO2: 28 mEq/L (ref 19–32)
Calcium: 9.3 mg/dL (ref 8.4–10.5)
Chloride: 105 mEq/L (ref 96–112)
Creatinine, Ser: 0.82 mg/dL (ref 0.40–1.20)
GFR: 75.01 mL/min (ref >60.00)
Glucose, Bld: 95 mg/dL (ref 70–99)
Potassium: 4.6 mEq/L (ref 3.5–5.1)
Sodium: 140 mEq/L (ref 135–145)
Total Bilirubin: 0.7 mg/dL (ref 0.2–1.2)
Total Protein: 6.4 g/dL (ref 6.0–8.3)

## 2022-01-17 LAB — CBC WITH DIFFERENTIAL/PLATELET
Basophils Absolute: 0 10*3/uL (ref 0.0–0.1)
Basophils Relative: 0.8 % (ref 0.0–3.0)
Eosinophils Absolute: 0.2 10*3/uL (ref 0.0–0.7)
Eosinophils Relative: 2.5 % (ref 0.0–5.0)
HCT: 42.4 % (ref 36.0–46.0)
Hemoglobin: 14.3 g/dL (ref 12.0–15.0)
Lymphocytes Relative: 30.9 % (ref 12.0–46.0)
Lymphs Abs: 1.9 10*3/uL (ref 0.7–4.0)
MCHC: 33.7 g/dL (ref 30.0–36.0)
MCV: 85 fl (ref 78.0–100.0)
Monocytes Absolute: 0.5 10*3/uL (ref 0.1–1.0)
Monocytes Relative: 7.6 % (ref 3.0–12.0)
Neutro Abs: 3.6 10*3/uL (ref 1.4–7.7)
Neutrophils Relative %: 58.2 % (ref 43.0–77.0)
Platelets: 199 10*3/uL (ref 150.0–400.0)
RBC: 4.99 Mil/uL (ref 3.87–5.11)
RDW: 15.5 % (ref 11.5–15.5)
WBC: 6.2 10*3/uL (ref 4.0–10.5)

## 2022-01-17 LAB — TSH: TSH: 4.13 u[IU]/mL (ref 0.35–5.50)

## 2022-01-24 ENCOUNTER — Encounter: Payer: Self-pay | Admitting: Family Medicine

## 2022-01-24 ENCOUNTER — Ambulatory Visit (INDEPENDENT_AMBULATORY_CARE_PROVIDER_SITE_OTHER): Payer: Medicare PPO | Admitting: Family Medicine

## 2022-01-24 VITALS — BP 126/80 | HR 73 | Ht 66.54 in | Wt 218.6 lb

## 2022-01-24 DIAGNOSIS — Z0000 Encounter for general adult medical examination without abnormal findings: Secondary | ICD-10-CM

## 2022-01-24 DIAGNOSIS — Z860100 Personal history of colon polyps, unspecified: Secondary | ICD-10-CM

## 2022-01-24 DIAGNOSIS — E669 Obesity, unspecified: Secondary | ICD-10-CM | POA: Diagnosis not present

## 2022-01-24 DIAGNOSIS — E7800 Pure hypercholesterolemia, unspecified: Secondary | ICD-10-CM | POA: Diagnosis not present

## 2022-01-24 DIAGNOSIS — Z1231 Encounter for screening mammogram for malignant neoplasm of breast: Secondary | ICD-10-CM | POA: Insufficient documentation

## 2022-01-24 DIAGNOSIS — E2839 Other primary ovarian failure: Secondary | ICD-10-CM

## 2022-01-24 NOTE — Progress Notes (Signed)
Subjective:    Patient ID: Michele Cruz, female    DOB: Dec 11, 1955, 66 y.o.   MRN: [id number]  HPI Pt presents for a welcome to medicare visit   I have personally reviewed the Medicare Annual Wellness questionnaire and have noted 1. The patient's medical and social history 2. Their use of alcohol, tobacco or illicit drugs 3. Their current medications and supplements 4. The patient's functional ability including ADL's, fall risks, home safety risks and hearing or visual             impairment. 5. Diet and physical activities 6. Evidence for depression or mood disorders  The patients weight, height, BMI have been recorded in the chart and visual acuity is per eye clinic.  I have made referrals, counseling and provided education to the patient based review of the above and I have provided the pt with a written personalized care plan for preventive services. Reviewed and updated provider list, see scanned forms.  See scanned forms.  Routine anticipatory guidance given to patient.  See health maintenance. Colon cancer screening colonoscopy 02/2019 with 7 y recall  Breast cancer screening   mammogram 02/2020 Pap and gyn exam utd 01/2021 Self breast exam: no lumps  Flu vaccine fall  Tetanus vaccine 01/2020 Td Pneumovax pna 23 in feb 2023 Zoster vaccine utd with shigrix  Covid immunized  Dexa- had  Falls: none  Fractures:none  Supplements: vit D  also high calcium foods  Exercise : regular/ weights    Advance directive: does have it up to date  Cognitive function addressed- see scanned forms- and if abnormal then additional documentation follows.  No memory difficulties /stays sharp   Has a divot in her scalp up top  Not painful  No h/o trauma that she knows of / cannot rule out   Doing well  Occ word searching   PMH and SH reviewed  Started some mild macular degeneration   Meds, vitals, and allergies reviewed.   ROS: See HPI.  Otherwise negative.    Weight : Wt  Readings from Last 3 Encounters:  01/24/22 218 lb 9.6 oz (99.2 kg)  08/16/21 227 lb (103 kg)  08/13/21 227 lb (103 kg)   34.72 kg/m  Doing ok overall  Going to [place] next month  Very excited  Taking care of herself   Weight is down : recently family changed diet for her husband  Is more aware  3-4 d per week goes to a gym / does HITT and wt training   Hearing/vision: Hearing Screening   '500Hz'$  '1000Hz'$  '2000Hz'$  '4000Hz'$   Right ear '25 25 25 25  '$ Left ear '25 25 25 25   '$ Vision Screening   Right eye Left eye Both eyes  Without correction '20/20 20/20 20/20 '$  With correction        PHQ:    01/23/2021   10:34 AM 01/24/2020    4:06 PM 01/19/2019    9:11 AM 01/12/2018    3:26 PM 12/09/2016   10:26 AM  Depression screen PHQ 2/9  Decreased Interest '1 1 1 '$ 0 0  Down, Depressed, Hopeless 1 1 0 0 0  PHQ - 2 Score '2 2 1 '$ 0 0  Altered sleeping 1 0 1  1  Tired, decreased energy '2 1 1  1  '$ Change in appetite '1 1 1  '$ 0  Feeling bad or failure about yourself  1 0 1  1  Trouble concentrating 1 0 0  0  Moving slowly or  fidgety/restless 0 0 0  0  Suicidal thoughts 0 0 0  0  PHQ-9 Score '8 4 5  3  '$ Difficult doing work/chores Somewhat difficult        ADLs:  no help needed   Functionality: Excellent   Care team : Tywan Siever-[facility]   BP Readings from Last 3 Encounters:  01/24/22 126/80  08/16/21 110/70  08/13/21 106/70   Pulse Readings from Last 3 Encounters:  01/24/22 73  08/16/21 82  08/13/21 70     Hyperlipidemia Lab Results  Component Value Date   CHOL 198 01/17/2022   CHOL 180 01/16/2021   CHOL 185 01/20/2020   Lab Results  Component Value Date   HDL 59.60 01/17/2022   HDL 63.40 01/16/2021   HDL 69.40 01/20/2020   Lab Results  Component Value Date   LDLCALC 126 (H) 01/17/2022   LDLCALC 100 (H) 01/16/2021   LDLCALC 98 01/20/2020   Lab Results  Component Value Date   TRIG 65.0 01/17/2022   TRIG 83.0 01/16/2021   TRIG 89.0 01/20/2020   Lab  Results  Component Value Date   CHOLHDL 3 01/17/2022   CHOLHDL 3 01/16/2021   CHOLHDL 3 01/20/2020   Lab Results  Component Value Date   LDLDIRECT 152.4 03/27/2011   LDLDIRECT 150.6 03/20/2010   LDLDIRECT 150.4 12/11/2009   Was taking crestor 2.5 mg daily  Thought it was affecting her muscles  Not worth it  Had that problem with lipitor also  No vasc dz in family   Is trying to eat better  No fried foods Occ red meat   The 10-year ASCVD risk score (Arnett DK, et al., 2019) is: 5.2%   Values used to calculate the score:     Age: 15 years     Sex: Female     Is Non-Hispanic African American: No     Diabetic: No     Tobacco smoker: No     Systolic Blood Pressure: 629 mmHg     Is BP treated: No     HDL Cholesterol: 59.6 mg/dL     Total Cholesterol: 198 mg/dL    Other labs  Lab Results  Component Value Date   CREATININE 0.82 01/17/2022   BUN 17 01/17/2022   NA 140 01/17/2022   K 4.6 01/17/2022   CL 105 01/17/2022   CO2 28 01/17/2022   Glucose 95 Lab Results  Component Value Date   WBC 6.2 01/17/2022   HGB 14.3 01/17/2022   HCT 42.4 01/17/2022   MCV 85.0 01/17/2022   PLT 199.0 01/17/2022   Lab Results  Component Value Date   TSH 4.13 01/17/2022   Patient Active Problem List   Diagnosis Date Noted   Encounter for screening mammogram for breast cancer 01/24/2022   Estrogen deficiency 01/24/2022   Internal and external bleeding hemorrhoids 08/16/2021   Total knee replacement status 07/28/2018   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   Internal hemorrhoids 07/21/2017   Hydradenitis 07/21/2017   Obesity (BMI 30-39.9) 12/09/2016   Encounter for routine gynecological examination 01/10/2014   Routine general medical examination at a health care facility 03/25/2011   HYPERCHOLESTEROLEMIA 12/11/2009   COLONIC POLYPS, HX OF 08/03/2009   HERNIATED CERVICAL DISC 06/20/2009   Herniated cervical disc 06/20/2009   BACK PAIN, LUMBAR, WITH RADICULOPATHY  10/13/2008   Generalized anxiety disorder 05/24/2007   ALLERGIC RHINITIS 05/24/2007   ROSACEA 05/24/2007   CARDIAC MURMUR 05/24/2007   Past Medical History:  Diagnosis Date   Allergic rhinitis    Anemia, iron deficiency    Anxiety    Arthritis    Asthma    no symptoms last 25 years   Colon polyps    COVID-19 07/2020   Family history of adverse reaction to anesthesia    sister PONV   Heart murmur    Heavy menses    Hemorrhoids    Hidradenitis suppurativa    History of exercise stress test 06/1999   neg   History of MRI 2003   neg per patient   History of MRI 2007   right quad fatty defect   History of MRI of spine 03/2004   C-5, C5-6 disk protrusion   Hyperlipidemia    LDL   Rosacea    Spondylosis    deg lumbar disc dz   Wisdom teeth removed    Zoster    Past Surgical History:  Procedure Laterality Date   BREAST CYST ASPIRATION  03/2002   BREAST CYST ASPIRATION  03/2004   left   CATARACT EXTRACTION     CESAREAN SECTION     COLONOSCOPY  01/26/2012   HEMORRHOID BANDING  08/2021   KNEE ARTHROPLASTY Left 03/15/2018   Procedure: COMPUTER ASSISTED TOTAL KNEE ARTHROPLASTY;  Surgeon: Dereck Leep, MD;  Location: [facility] OR[facility];  Service: Orthopedics;  Laterality: Left;   KNEE ARTHROPLASTY Right 07/28/2018   Procedure: COMPUTER ASSISTED TOTAL KNEE ARTHROPLASTY;  Surgeon: Dereck Leep, MD;  Location: [facility];  Service: Orthopedics;  Laterality: Right;   KNEE SURGERY     left   Social History   Tobacco Use   Smoking status: Never   Smokeless tobacco: Never  Vaping Use   Vaping Use: Never used  Substance Use Topics   Alcohol use: Yes    Alcohol/week: 4.0 standard drinks of alcohol    Types: 2 Glasses of wine, 2 Cans of beer per week    Comment: 2-4 glasses wine or beer weekly per pt.   Drug use: No   Family History  Problem Relation Age of Onset   Arthritis Mother        OA   Obesity Mother    Diverticulitis Mother    Colon polyps Mother    Heart disease  Father        CAD   Alcohol abuse Father    Diabetes Father    Hyperlipidemia Sister    Diverticulitis Sister    Diabetes Other    Diverticulitis Sister    Colon cancer Neg Hx    Breast cancer Neg Hx    Esophageal cancer Neg Hx    Rectal cancer Neg Hx    Stomach cancer Neg Hx    Allergies  Allergen Reactions   Decadron [Dexamethasone] Other (See Comments)    Unsure if related---swelling of throat   Crestor [Rosuvastatin]     Muscle pain    Depo-Medrol [Methylprednisolone Acetate]     Rash after injection, a noted common SE from depo-medrol listed in epocrates and most resources   Lipitor [Atorvastatin Calcium] Other (See Comments)    Leg cramps and pain   Meloxicam     Pt felt dizzy potentially from this, but does well on aleve   Simvastatin     Possible transient memory changes, resolved off medicine. Muscle pain   Current Outpatient Medications on File Prior to Visit  Medication Sig Dispense Refill   COLLAGEN PO Take by mouth daily. BioTRUST Multi-collagen, 10 g bioactive daily  Fluocinolone Acetonide 0.01 % OIL Place 1 drop into both ears as needed.      hydrocortisone (ANUSOL-HC) 25 MG suppository Place 1 suppository (25 mg total) rectally at bedtime. (Patient taking differently: Place 25 mg rectally as needed.) 7 suppository 3   loratadine (CLARITIN) 10 MG tablet Take 10 mg by mouth daily.     Magnesium Oxide 250 MG TABS Take 250 mg by mouth daily.      Misc Natural Products (TURMERIC CURCUMIN) CAPS Take 1 capsule by mouth daily. Theracurmin     Omega-3 Fatty Acids (FISH OIL) 1200 MG CAPS Take 1,200 mg by mouth daily.      No current facility-administered medications on file prior to visit.      Review of Systems  Constitutional:  Negative for activity change, appetite change, fatigue, fever and unexpected weight change.  HENT:  Negative for congestion, ear pain, rhinorrhea, sinus pressure and sore throat.   Eyes:  Negative for pain, redness and visual  disturbance.  Respiratory:  Negative for cough, shortness of breath and wheezing.   Cardiovascular:  Negative for chest pain and palpitations.  Gastrointestinal:  Negative for abdominal pain, blood in stool, constipation and diarrhea.  Endocrine: Negative for polydipsia and polyuria.  Genitourinary:  Negative for dysuria, frequency and urgency.  Musculoskeletal:  Positive for arthralgias. Negative for back pain and myalgias.  Skin:  Negative for pallor and rash.  Allergic/Immunologic: Negative for environmental allergies.  Neurological:  Negative for dizziness, syncope and headaches.  Hematological:  Negative for adenopathy. Does not bruise/bleed easily.  Psychiatric/Behavioral:  Negative for decreased concentration and dysphoric mood. The patient is not nervous/anxious.        Objective:   Physical Exam Constitutional:      General: She is not in acute distress.    Appearance: Normal appearance. She is well-developed. She is obese. She is not ill-appearing or diaphoretic.  HENT:     Head: Normocephalic and atraumatic.     Right Ear: Tympanic membrane, ear canal and external ear normal.     Left Ear: Tympanic membrane, ear canal and external ear normal.     Nose: Nose normal. No congestion.     Mouth/Throat:     Mouth: Mucous membranes are moist.     Pharynx: Oropharynx is clear. No posterior oropharyngeal erythema.  Eyes:     General: No scleral icterus.    Extraocular Movements: Extraocular movements intact.     Conjunctiva/sclera: Conjunctivae normal.     Pupils: Pupils are equal, round, and reactive to light.  Neck:     Thyroid: No thyromegaly.     Vascular: No carotid bruit or JVD.  Cardiovascular:     Rate and Rhythm: Normal rate and regular rhythm.     Pulses: Normal pulses.     Heart sounds: Normal heart sounds.     No gallop.  Pulmonary:     Effort: Pulmonary effort is normal. No respiratory distress.     Breath sounds: Normal breath sounds. No wheezing.      Comments: Good air exch Chest:     Chest wall: No tenderness.  Abdominal:     General: Bowel sounds are normal. There is no distension or abdominal bruit.     Palpations: Abdomen is soft. There is no mass.     Tenderness: There is no abdominal tenderness.     Hernia: No hernia is present.  Genitourinary:    Comments: Breast exam: No mass, nodules, thickening, tenderness, bulging, retraction, inflamation, nipple discharge  or skin changes noted.  No axillary or clavicular LA.     Musculoskeletal:        General: No tenderness. Normal range of motion.     Cervical back: Normal range of motion and neck supple. No rigidity. No muscular tenderness.     Right lower leg: No edema.     Left lower leg: No edema.     Comments: No kyphosis   Lymphadenopathy:     Cervical: No cervical adenopathy.  Skin:    General: Skin is warm and dry.     Coloration: Skin is not pale.     Findings: No erythema or rash.     Comments: Solar lentigines diffusely fair  Neurological:     Mental Status: She is alert. Mental status is at baseline.     Cranial Nerves: No cranial nerve deficit.     Motor: No abnormal muscle tone.     Coordination: Coordination normal.     Gait: Gait normal.     Deep Tendon Reflexes: Reflexes are normal and symmetric. Reflexes normal.  Psychiatric:        Mood and Affect: Mood normal.        Cognition and Memory: Cognition and memory normal.           Assessment & Plan:   Problem List Items Addressed This Visit       Other   COLONIC POLYPS, HX OF   Encounter for screening mammogram for breast cancer    Mammogram ordered for sept Pt will call to schedule       Relevant Orders   MM 3D SCREEN BREAST BILATERAL   Estrogen deficiency   Relevant Orders   DG Bone Density   HYPERCHOLESTEROLEMIA    Disc goals for lipids and reasons to control them Rev last labs with pt Rev low sat fat diet in detail Well controlled with crestor 2.5 mg daily but pt had to stop due to  muscle pain (aldo intol of lipitor) LDL of 126  Will continue to follow  May consider zetia in the future       Obesity (BMI 30-39.9)    Discussed how this problem influences overall health and the risks it imposes  Reviewed plan for weight loss with lower calorie diet (via better food choices and also portion control or program like weight watchers) and exercise building up to or more than 30 minutes 5 days per week including some aerobic activity         Routine general medical examination at a health care facility - Primary    Reviewed health habits including diet and exercise and skin cancer prevention Reviewed appropriate screening tests for age  Also reviewed health mt list, fam hx and immunization status , as well as social and family history   See HPI Labs reviewed  Colonoscopy is utd Mammogram ordered  dexa ordered (no falls or fractures)  Taking vit D and exercising  Advance directive is utd No cognitive concerns  Hearing and vision screen reviewed/normal PHQ score reviewed / counseling is an option  No help needed with ADLs , good functionality

## 2022-01-24 NOTE — Patient Instructions (Addendum)
Keep up the exercise !  Take care of herself   If the scalp change becomes more prominent , if you feel a lump or anything else changes let us know   Please schedule your mammogram and dexa at [facility]  Please call the location of your choice from the menu below to schedule your Mammogram and/or Bone Density appointment.    [facility]                      Phone:  [phone], [place] [address]                                                             Services: Traditional and 3D Mammogram, [facility]                 Phone: [phone] [address], [place] [address]    Service: Bone Density ONLY   *this site does NOT perform mammograms  [facility]                        Phone:  [phone] [address], [place] [address]                                            Services:  3D Mammogram and [place] at [facility]   Phone:  [phone]   [place], [place] [address]                                            Services: 3D Mammogram and [place]  [place] at [facility])  Phone:  [phone]   [address] Room  [place], [place] [address]                                              Services:  3D Mammogram and Bone Density

## 2022-01-26 NOTE — Assessment & Plan Note (Signed)
Disc goals for lipids and reasons to control them Rev last labs with pt Rev low sat fat diet in detail Well controlled with crestor 2.5 mg daily but pt had to stop due to muscle pain (aldo intol of lipitor) LDL of 126  Will continue to follow  May consider zetia in the future

## 2022-01-26 NOTE — Assessment & Plan Note (Addendum)
Reviewed health habits including diet and exercise and skin cancer prevention Reviewed appropriate screening tests for age  Also reviewed health mt list, fam hx and immunization status , as well as social and family history   See HPI Labs reviewed  Colonoscopy is utd Mammogram ordered  dexa ordered (no falls or fractures)  Taking vit D and exercising  Advance directive is utd No cognitive concerns  Hearing and vision screen reviewed/normal PHQ score reviewed / counseling is an option  No help needed with ADLs , good functionality

## 2022-01-26 NOTE — Assessment & Plan Note (Signed)
Discussed how this problem influences overall health and the risks it imposes  Reviewed plan for weight loss with lower calorie diet (via better food choices and also portion control or program like weight watchers) and exercise building up to or more than 30 minutes 5 days per week including some aerobic activity    

## 2022-01-26 NOTE — Assessment & Plan Note (Signed)
Mammogram ordered for sept Pt will call to schedule

## 2022-02-07 ENCOUNTER — Ambulatory Visit
Admission: RE | Admit: 2022-02-07 | Discharge: 2022-02-07 | Disposition: A | Discharge status: Home / Self Care | Payer: Medicare PPO | Source: Ambulatory Visit | Attending: Family Medicine | Admitting: Family Medicine

## 2022-02-07 DIAGNOSIS — Z1231 Encounter for screening mammogram for malignant neoplasm of breast: Secondary | ICD-10-CM

## 2022-02-11 ENCOUNTER — Other Ambulatory Visit: Payer: Self-pay | Admitting: Family Medicine

## 2022-02-11 DIAGNOSIS — R928 Other abnormal and inconclusive findings on diagnostic imaging of breast: Secondary | ICD-10-CM

## 2022-02-20 ENCOUNTER — Other Ambulatory Visit: Payer: Self-pay | Admitting: Family Medicine

## 2022-02-20 ENCOUNTER — Ambulatory Visit
Admission: RE | Admit: 2022-02-20 | Discharge: 2022-02-20 | Disposition: A | Discharge status: Home / Self Care | Payer: Medicare PPO | Source: Ambulatory Visit | Attending: Family Medicine | Admitting: Family Medicine

## 2022-02-20 DIAGNOSIS — R921 Mammographic calcification found on diagnostic imaging of breast: Secondary | ICD-10-CM

## 2022-02-20 DIAGNOSIS — R928 Other abnormal and inconclusive findings on diagnostic imaging of breast: Secondary | ICD-10-CM

## 2022-03-10 ENCOUNTER — Ambulatory Visit
Admission: RE | Admit: 2022-03-10 | Discharge: 2022-03-10 | Disposition: A | Discharge status: Home / Self Care | Payer: Medicare PPO | Source: Ambulatory Visit | Attending: Emergency Medicine | Admitting: Emergency Medicine

## 2022-03-10 ENCOUNTER — Inpatient Hospital Stay: Admission: RE | Admit: 2022-03-10 | Payer: Medicare PPO | Source: Ambulatory Visit

## 2022-03-10 VITALS — BP 137/77 | HR 89 | Temp 98.6°F | Resp 18 | Ht 67.0 in | Wt 215.0 lb

## 2022-03-10 DIAGNOSIS — U071 COVID-19: Secondary | ICD-10-CM | POA: Diagnosis not present

## 2022-03-10 DIAGNOSIS — R051 Acute cough: Secondary | ICD-10-CM | POA: Diagnosis not present

## 2022-03-10 MED ORDER — PROMETHAZINE-DM 6.25-15 MG/5ML PO SYRP: 5.0000 mL | ORAL_SOLUTION | Freq: Four times a day (QID) | ORAL | 0 refills | Status: DC | PRN

## 2022-03-10 NOTE — ED Triage Notes (Signed)
Patient to Urgent Care with complaints of fevers (no fever today), loss of smell/ taste, sneezing, loss of appetite, fatigue, strong but dry cough that started on Saturday.   Also reports that her ears feel plugged and she feels a constant humming sound.   Reports positive home covid test on 9/16.

## 2022-03-10 NOTE — Discharge Instructions (Addendum)
Take the promethazine DM as directed for cough.  Do not drive, operate machinery, drink alcohol, or perform dangerous activities while taking this medication as it may cause drowsiness.  Follow up with your primary care provider if your symptoms are not improving.    

## 2022-03-10 NOTE — ED Provider Notes (Signed)
Michele Cruz    CSN: [id number] Arrival date & time: 03/10/22  0901      History   Chief Complaint Chief Complaint  Patient presents with   Cough    Positive in-home Covid test Saturday, September 16th. Symptoms started Saturday, early morning, returning from a cruise.Fever, loss of smell and taste, coughing, ears plugged with constant humming sound, sneezing, low energy, loss of appetite. - Entered by patient    HPI Michele Cruz is a 66 y.o. female.  Patient presents with fatigue, fever, sneezing, cough, loss of taste and smell, loss of appetite since 03/08/2022.  Positive COVID test at home on 03/08/2022.  T-max 100 on day 1 of symptoms; none since.  No rash, sore throat, chest pain, shortness of breath, vomiting, diarrhea, or other symptoms.  Her cough is nonproductive, worse at night, keeping her from sleeping well.  No OTC medications today.  Her medical history includes iron deficiency anemia, allergic rhinitis, anxiety, asthma, heart murmur, hyperlipidemia.  The history is provided by the patient and medical records.    Past Medical History:  Diagnosis Date   Allergic rhinitis    Anemia, iron deficiency    Anxiety    Arthritis    Asthma    no symptoms last 25 years   Colon polyps    COVID-19 07/2020   Family history of adverse reaction to anesthesia    sister PONV   Heart murmur    Heavy menses    Hemorrhoids    Hidradenitis suppurativa    History of exercise stress test 06/1999   neg   History of MRI 2003   neg per patient   History of MRI 2007   right quad fatty defect   History of MRI of spine 03/2004   C-5, C5-6 disk protrusion   Hyperlipidemia    LDL   Rosacea    Spondylosis    deg lumbar disc dz   Wisdom teeth removed    Zoster     Patient Active Problem List   Diagnosis Date Noted   Encounter for screening mammogram for breast cancer 01/24/2022   Estrogen deficiency 01/24/2022   Internal and external bleeding hemorrhoids 08/16/2021    Total knee replacement status 07/28/2018   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   Internal hemorrhoids 07/21/2017   Hydradenitis 07/21/2017   Obesity (BMI 30-39.9) 12/09/2016   Encounter for routine gynecological examination 01/10/2014   Routine general medical examination at a health care facility 03/25/2011   HYPERCHOLESTEROLEMIA 12/11/2009   COLONIC POLYPS, HX OF 08/03/2009   HERNIATED CERVICAL DISC 06/20/2009   Herniated cervical disc 06/20/2009   BACK PAIN, LUMBAR, WITH RADICULOPATHY 10/13/2008   Generalized anxiety disorder 05/24/2007   ALLERGIC RHINITIS 05/24/2007   ROSACEA 05/24/2007   CARDIAC MURMUR 05/24/2007    Past Surgical History:  Procedure Laterality Date   BREAST CYST ASPIRATION  03/2002   BREAST CYST ASPIRATION  03/2004   left   CATARACT EXTRACTION     CESAREAN SECTION     COLONOSCOPY  01/26/2012   HEMORRHOID BANDING  08/2021   KNEE ARTHROPLASTY Left 03/15/2018   Procedure: COMPUTER ASSISTED TOTAL KNEE ARTHROPLASTY;  Surgeon: Dereck Leep, MD;  Location: [facility];  Service: Orthopedics;  Laterality: Left;   KNEE ARTHROPLASTY Right 07/28/2018   Procedure: COMPUTER ASSISTED TOTAL KNEE ARTHROPLASTY;  Surgeon: Dereck Leep, MD;  Location: [facility] OR[facility];  Service: Orthopedics;  Laterality: Right;   KNEE SURGERY     left  OB History   No obstetric history on file.      Home Medications    Prior to Admission medications   Medication Sig Start Date End Date Taking? Authorizing Provider  promethazine-dextromethorphan (PROMETHAZINE-DM) 6.25-15 MG/5ML syrup Take 5 mLs by mouth 4 (four) times daily as needed for cough. 03/10/22  Yes Sharion Balloon, NP  COLLAGEN PO Take by mouth daily. BioTRUST Multi-collagen, 10 g bioactive daily    [provider]  Fluocinolone Acetonide 0.01 % OIL Place 1 drop into both ears as needed.     [provider]  hydrocortisone (ANUSOL-HC) 25 MG suppository Place 1 suppository (25 mg total) rectally at  bedtime. Patient taking differently: Place 25 mg rectally as needed. 02/15/19   Tower, Wynelle Fanny, MD  loratadine (CLARITIN) 10 MG tablet Take 10 mg by mouth daily.    [provider]  Magnesium Oxide 250 MG TABS Take 250 mg by mouth daily.     [provider]  Misc Natural Products (TURMERIC CURCUMIN) CAPS Take 1 capsule by mouth daily. Theracurmin    [provider]  Omega-3 Fatty Acids (FISH OIL) 1200 MG CAPS Take 1,200 mg by mouth daily.     [provider]    Family History Family History  Problem Relation Age of Onset   Arthritis Mother        OA   Obesity Mother    Diverticulitis Mother    Colon polyps Mother    Heart disease Father        CAD   Alcohol abuse Father    Diabetes Father    Hyperlipidemia Sister    Diverticulitis Sister    Diabetes Other    Diverticulitis Sister    Colon cancer Neg Hx    Breast cancer Neg Hx    Esophageal cancer Neg Hx    Rectal cancer Neg Hx    Stomach cancer Neg Hx     Social History Social History   Tobacco Use   Smoking status: Never   Smokeless tobacco: Never  Vaping Use   Vaping Use: Never used  Substance Use Topics   Alcohol use: Yes    Alcohol/week: 4.0 standard drinks of alcohol    Types: 2 Glasses of wine, 2 Cans of beer per week    Comment: 2-4 glasses wine or beer weekly per pt.   Drug use: No     Allergies   Decadron [dexamethasone], Crestor [rosuvastatin], Depo-medrol [methylprednisolone acetate], Lipitor [atorvastatin calcium], Meloxicam, and Simvastatin   Review of Systems Review of Systems  Constitutional:  Positive for fatigue and fever. Negative for chills.  HENT:  Negative for ear discharge, ear pain and sore throat.   Respiratory:  Positive for cough. Negative for shortness of breath.   Cardiovascular:  Negative for chest pain and palpitations.  Gastrointestinal:  Negative for diarrhea and vomiting.  Skin:  Negative for color change and rash.  All other systems  reviewed and are negative.    Physical Exam Triage Vital Signs ED Triage Vitals  Enc Vitals Group     BP 03/10/22 0909 137/77     Pulse Rate 03/10/22 0909 89     Resp 03/10/22 0909 18     Temp 03/10/22 0909 98.6 F (37 C)     Temp src --      SpO2 03/10/22 0909 97 %     Weight 03/10/22 0912 215 lb (97.5 kg)     Height 03/10/22 0912 '5\' 7"'$  (1.702 m)  Head Circumference --      Peak Flow --      Pain Score --      Pain Loc --      Pain Edu? --      Excl. in [place]? --    No data found.  Updated Vital Signs BP 137/77   Pulse 89   Temp 98.6 F (37 C)   Resp 18   Ht '5\' 7"'$  (1.702 m)   Wt 215 lb (97.5 kg)   SpO2 97%   BMI 33.67 kg/m   Visual Acuity Right Eye Distance:   Left Eye Distance:   Bilateral Distance:    Right Eye Near:   Left Eye Near:    Bilateral Near:     Physical Exam Vitals and nursing note reviewed.  Constitutional:      General: She is not in acute distress.    Appearance: Normal appearance. She is well-developed. She is not ill-appearing.  HENT:     Head: Atraumatic.     Right Ear: Tympanic membrane normal.     Left Ear: Tympanic membrane normal.     Nose: Rhinorrhea present.     Mouth/Throat:     Mouth: Mucous membranes are moist.     Pharynx: Oropharynx is clear.  Cardiovascular:     Rate and Rhythm: Normal rate and regular rhythm.     Heart sounds: Normal heart sounds.  Pulmonary:     Effort: Pulmonary effort is normal. No respiratory distress.     Breath sounds: Normal breath sounds.  Musculoskeletal:     Cervical back: Neck supple.  Skin:    General: Skin is warm and dry.  Neurological:     Mental Status: She is alert.  Psychiatric:        Mood and Affect: Mood normal.        Behavior: Behavior normal.      UC Treatments / Results  Labs (all labs ordered are listed, but only abnormal results are displayed) Labs Reviewed - No data to display  EKG   Radiology No results found.  Procedures Procedures (including  critical care time)  Medications Ordered in UC Medications - No data to display  Initial Impression / Assessment and Plan / UC Course  I have reviewed the triage vital signs and the nursing notes.  Pertinent labs & imaging results that were available during my care of the patient were reviewed by me and considered in my medical decision making (see chart for details).   COVID-19, cough.  Symptom onset and tested positive at home for COVID on 03/08/2022.  Discussed treatment options including Paxlovid and molnupiravir.  Patient declines antiviral treatment today.  She states she just wants to have cough medication that will allow her to sleep.  Treating with Promethazine DM.  Precautions for drowsiness with this medication discussed.  Education provided on cough and COVID-related quarantine.  Instructed patient to follow-up with her PCP.  She agrees to plan of care.  Final Clinical Impressions(s) / UC Diagnoses   Final diagnoses:  COVID-19  Acute cough     Discharge Instructions      Take the promethazine DM as directed for cough.  Do not drive, operate machinery, drink alcohol, or perform dangerous activities while taking this medication as it may cause drowsiness.    Follow up with your primary care provider if your symptoms are not improving.        ED Prescriptions     Medication Sig Dispense Auth.  Provider   promethazine-dextromethorphan (PROMETHAZINE-DM) 6.25-15 MG/5ML syrup Take 5 mLs by mouth 4 (four) times daily as needed for cough. 118 mL Sharion Balloon, NP      PDMP not reviewed this encounter.   Sharion Balloon, NP 03/10/22 [phone]

## 2022-03-11 ENCOUNTER — Encounter: Payer: Self-pay | Admitting: Family Medicine

## 2022-03-28 ENCOUNTER — Ambulatory Visit
Admission: RE | Admit: 2022-03-28 | Discharge: 2022-03-28 | Disposition: A | Discharge status: Home / Self Care | Payer: Medicare PPO | Source: Ambulatory Visit | Attending: Family Medicine | Admitting: Family Medicine

## 2022-03-28 DIAGNOSIS — R921 Mammographic calcification found on diagnostic imaging of breast: Secondary | ICD-10-CM

## 2022-03-28 HISTORY — PX: BREAST BIOPSY: SHX20

## 2022-03-31 ENCOUNTER — Encounter: Payer: Self-pay | Admitting: *Deleted

## 2022-03-31 ENCOUNTER — Encounter: Payer: Self-pay | Admitting: Family Medicine

## 2022-03-31 NOTE — Telephone Encounter (Signed)
Appt scheduled with PCP

## 2022-03-31 NOTE — Progress Notes (Signed)
Received referral for newly diagnosed breast cancer from [facility].  Navigation initiated.  A list of surgeons and medical oncologists was sent to Michele Cruz via [employer].  She wants to discuss her options further with her PCP Dr. Glori Bickers.

## 2022-04-01 ENCOUNTER — Ambulatory Visit: Payer: Medicare PPO | Admitting: Family Medicine

## 2022-04-01 ENCOUNTER — Encounter: Payer: Self-pay | Admitting: Family Medicine

## 2022-04-01 ENCOUNTER — Encounter: Payer: Self-pay | Admitting: *Deleted

## 2022-04-01 VITALS — BP 148/94 | HR 78 | Temp 97.3°F | Ht 66.5 in | Wt 220.0 lb

## 2022-04-01 DIAGNOSIS — D0510 Intraductal carcinoma in situ of unspecified breast: Secondary | ICD-10-CM | POA: Insufficient documentation

## 2022-04-01 DIAGNOSIS — E669 Obesity, unspecified: Secondary | ICD-10-CM | POA: Diagnosis not present

## 2022-04-01 DIAGNOSIS — D0511 Intraductal carcinoma in situ of right breast: Secondary | ICD-10-CM

## 2022-04-01 NOTE — Assessment & Plan Note (Signed)
Now with new dx with breast cancer  Disc imp of self care incl healthy diet and exercise as tolerated in setting of treatment

## 2022-04-01 NOTE — Progress Notes (Signed)
Subjective:    Patient ID: Michele Cruz, female    DOB: 08-15-1955, 66 y.o.   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 with some fat necrosis Neg for invasive carcinoma   Has questions about treatment options and order   Is sore after the biopsy   No fam h/o breast cancer   Feels ok  She is dealing well emotionally   Patient Active Problem List   Diagnosis Date Noted   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   Encounter for screening mammogram for breast cancer 01/24/2022   Estrogen deficiency 01/24/2022   Internal and external bleeding hemorrhoids 08/16/2021   Total knee replacement status 07/28/2018   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   Internal hemorrhoids 07/21/2017   Hydradenitis 07/21/2017   Obesity (BMI 30-39.9) 12/09/2016   Encounter for routine gynecological examination 01/10/2014   Routine general medical examination at a health care facility 03/25/2011   HYPERCHOLESTEROLEMIA 12/11/2009   COLONIC POLYPS, HX OF 08/03/2009   HERNIATED CERVICAL DISC 06/20/2009   Herniated cervical disc 06/20/2009   BACK PAIN, LUMBAR, WITH RADICULOPATHY 10/13/2008   Generalized anxiety disorder 05/24/2007   ALLERGIC RHINITIS 05/24/2007   ROSACEA 05/24/2007   CARDIAC MURMUR 05/24/2007   Past Medical History:  Diagnosis Date   Allergic rhinitis    Anemia, iron deficiency    Anxiety    Arthritis    Asthma    no symptoms last 25 years   Colon polyps    COVID-19 07/2020   Family history of adverse reaction to anesthesia    sister PONV   Heart murmur    Heavy menses     Hemorrhoids    Hidradenitis suppurativa    History of exercise stress test 06/1999   neg   History of MRI 2003   neg per patient   History of MRI 2007   right quad fatty defect   History of MRI of spine 03/2004   C-5, C5-6 disk protrusion   Hyperlipidemia    LDL   Rosacea    Spondylosis    deg lumbar disc dz   Wisdom teeth removed    Zoster    Past Surgical History:  Procedure Laterality Date   BREAST CYST ASPIRATION  03/2002   BREAST CYST ASPIRATION  03/2004   left   CATARACT EXTRACTION     CESAREAN SECTION     COLONOSCOPY  01/26/2012   HEMORRHOID BANDING  08/2021   KNEE ARTHROPLASTY Left 03/15/2018   Procedure: COMPUTER ASSISTED TOTAL KNEE ARTHROPLASTY;  Surgeon: Dereck Leep, MD;  Location: [facility] OR[facility];  Service: Orthopedics;  Laterality: Left;   KNEE ARTHROPLASTY Right 07/28/2018   Procedure: COMPUTER ASSISTED TOTAL KNEE ARTHROPLASTY;  Surgeon: Dereck Leep, MD;  Location: [facility] OR[facility];  Service: Orthopedics;  Laterality: Right;   KNEE SURGERY     left   Social History   Tobacco Use   Smoking status: Never   Smokeless tobacco: Never  Vaping Use   Vaping Use: Never used  Substance Use Topics  Alcohol use: Yes    Alcohol/week: 4.0 standard drinks of alcohol    Types: 2 Glasses of wine, 2 Cans of beer per week    Comment: 2-4 glasses wine or beer weekly per pt.   Drug use: No   Family History  Problem Relation Age of Onset   Arthritis Mother        OA   Obesity Mother    Diverticulitis Mother    Colon polyps Mother    Heart disease Father        CAD   Alcohol abuse Father    Diabetes Father    Hyperlipidemia Sister    Diverticulitis Sister    Diabetes Other    Diverticulitis Sister    Colon cancer Neg Hx    Breast cancer Neg Hx    Esophageal cancer Neg Hx    Rectal cancer Neg Hx    Stomach cancer Neg Hx    Allergies  Allergen Reactions   Decadron [Dexamethasone] Other (See Comments)    Unsure if related---swelling of throat   Crestor  [Rosuvastatin]     Muscle pain    Depo-Medrol [Methylprednisolone Acetate]     Rash after injection, a noted common SE from depo-medrol listed in epocrates and most resources   Lipitor [Atorvastatin Calcium] Other (See Comments)    Leg cramps and pain   Meloxicam     Pt felt dizzy potentially from this, but does well on aleve   Simvastatin     Possible transient memory changes, resolved off medicine. Muscle pain   Current Outpatient Medications on File Prior to Visit  Medication Sig Dispense Refill   COLLAGEN PO Take by mouth daily. BioTRUST Multi-collagen, 10 g bioactive daily     Fluocinolone Acetonide 0.01 % OIL Place 1 drop into both ears as needed.      hydrocortisone (ANUSOL-HC) 25 MG suppository Place 1 suppository (25 mg total) rectally at bedtime. (Patient taking differently: Place 25 mg rectally as needed.) 7 suppository 3   loratadine (CLARITIN) 10 MG tablet Take 10 mg by mouth daily.     Magnesium Oxide 250 MG TABS Take 250 mg by mouth daily.      Misc Natural Products (TURMERIC CURCUMIN) CAPS Take 1 capsule by mouth daily. Theracurmin     Omega-3 Fatty Acids (FISH OIL) 1200 MG CAPS Take 1,200 mg by mouth daily.      No current facility-administered medications on file prior to visit.    Review of Systems  Constitutional:  Negative for activity change, appetite change, fatigue, fever and unexpected weight change.  HENT:  Negative for congestion, ear pain, rhinorrhea, sinus pressure and sore throat.   Eyes:  Negative for pain, redness and visual disturbance.  Respiratory:  Negative for cough, shortness of breath and wheezing.   Cardiovascular:  Negative for chest pain and palpitations.  Gastrointestinal:  Negative for abdominal pain, blood in stool, constipation and diarrhea.  Endocrine: Negative for polydipsia and polyuria.  Genitourinary:  Negative for dysuria, frequency and urgency.  Musculoskeletal:  Negative for arthralgias, back pain and myalgias.  Skin:  Negative  for pallor and rash.  Allergic/Immunologic: Negative for environmental allergies.  Neurological:  Negative for dizziness, syncope and headaches.  Hematological:  Negative for adenopathy. Does not bruise/bleed easily.  Psychiatric/Behavioral:  Negative for decreased concentration and dysphoric mood. The patient is not nervous/anxious.        Objective:   Physical Exam Constitutional:      General: She is not in acute  distress.    Appearance: Normal appearance. She is well-developed. She is obese. She is not ill-appearing or diaphoretic.  HENT:     Head: Normocephalic and atraumatic.  Eyes:     General: No scleral icterus.    Conjunctiva/sclera: Conjunctivae normal.     Pupils: Pupils are equal, round, and reactive to light.  Neck:     Thyroid: No thyromegaly.     Vascular: No carotid bruit or JVD.  Cardiovascular:     Rate and Rhythm: Normal rate and regular rhythm.     Heart sounds: Normal heart sounds.     No gallop.  Pulmonary:     Effort: Pulmonary effort is normal. No respiratory distress.     Breath sounds: Normal breath sounds. No wheezing or rales.  Abdominal:     General: There is no distension or abdominal bruit.     Palpations: Abdomen is soft.  Genitourinary:    Comments: Noted bruise and healed bx side on R breast  No lump Not tender  Musculoskeletal:     Cervical back: Normal range of motion and neck supple.     Right lower leg: No edema.     Left lower leg: No edema.  Lymphadenopathy:     Cervical: No cervical adenopathy.  Skin:    General: Skin is warm and dry.     Coloration: Skin is not pale.     Findings: No erythema or rash.  Neurological:     Mental Status: She is alert.     Coordination: Coordination normal.     Deep Tendon Reflexes: Reflexes are normal and symmetric. Reflexes normal.  Psychiatric:        Mood and Affect: Mood normal.           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

## 2022-04-01 NOTE — Patient Instructions (Addendum)
I'm more familiar with the [facility] doctors  Also oncologists in [facility] and get things started   Take care of yourself  Keep exercising when you can  Pay attention to nutrition   Try to get most of your carbohydrates from produce (with the exception of white potatoes)  Eat less bread/pasta/rice/snack foods/cereals/sweets and other items from the middle of the grocery store (processed carbs)

## 2022-04-01 NOTE — Progress Notes (Signed)
Referral sent to [facility], their office will call her directly.   She will see Dr. Tasia Catchings at the [facility] on 10/11 at 10:45.  Appt details given to her.

## 2022-04-01 NOTE — Assessment & Plan Note (Signed)
Positive bx on R breast  In process of setting up surg and onc appts [facility] RN is helping with navigator) Reviewed opt , will likely have surg consult first/pref [facility]  No fam hx Pt tolerated bx well and is back to exercise  Disc imp of self care and good health habits  Dealing well emotionally so far /is keeping well informed  Will continue to follow

## 2022-04-02 ENCOUNTER — Inpatient Hospital Stay: Payer: Medicare PPO

## 2022-04-02 ENCOUNTER — Encounter: Payer: Self-pay | Admitting: Oncology

## 2022-04-02 ENCOUNTER — Inpatient Hospital Stay: Payer: Medicare PPO | Attending: Oncology | Admitting: Oncology

## 2022-04-02 VITALS — BP 139/79 | HR 73 | Temp 97.5°F | Resp 18 | Ht 66.5 in | Wt 219.0 lb

## 2022-04-02 DIAGNOSIS — Z8616 Personal history of COVID-19: Secondary | ICD-10-CM | POA: Insufficient documentation

## 2022-04-02 DIAGNOSIS — D0511 Intraductal carcinoma in situ of right breast: Secondary | ICD-10-CM | POA: Diagnosis present

## 2022-04-02 DIAGNOSIS — M129 Arthropathy, unspecified: Secondary | ICD-10-CM | POA: Diagnosis not present

## 2022-04-02 DIAGNOSIS — D509 Iron deficiency anemia, unspecified: Secondary | ICD-10-CM | POA: Diagnosis not present

## 2022-04-02 DIAGNOSIS — Z7189 Other specified counseling: Secondary | ICD-10-CM | POA: Insufficient documentation

## 2022-04-02 DIAGNOSIS — Z86010 Personal history of colonic polyps: Secondary | ICD-10-CM | POA: Diagnosis not present

## 2022-04-02 DIAGNOSIS — J45909 Unspecified asthma, uncomplicated: Secondary | ICD-10-CM | POA: Insufficient documentation

## 2022-04-02 DIAGNOSIS — Z170 Estrogen receptor positive status [ER+]: Secondary | ICD-10-CM | POA: Insufficient documentation

## 2022-04-02 DIAGNOSIS — E785 Hyperlipidemia, unspecified: Secondary | ICD-10-CM | POA: Insufficient documentation

## 2022-04-02 DIAGNOSIS — Z79899 Other long term (current) drug therapy: Secondary | ICD-10-CM | POA: Insufficient documentation

## 2022-04-02 LAB — CBC WITH DIFFERENTIAL/PLATELET
Abs Immature Granulocytes: 0.02 10*3/uL (ref 0.00–0.07)
Basophils Absolute: 0.1 10*3/uL (ref 0.0–0.1)
Basophils Relative: 1 %
Eosinophils Absolute: 0.2 10*3/uL (ref 0.0–0.5)
Eosinophils Relative: 4 %
HCT: 43.7 % (ref 36.0–46.0)
Hemoglobin: 14.6 g/dL (ref 12.0–15.0)
Immature Granulocytes: 0 %
Lymphocytes Relative: 28 %
Lymphs Abs: 1.9 10*3/uL (ref 0.7–4.0)
MCH: 29 pg (ref 26.0–34.0)
MCHC: 33.4 g/dL (ref 30.0–36.0)
MCV: 86.9 fL (ref 80.0–100.0)
Monocytes Absolute: 0.5 10*3/uL (ref 0.1–1.0)
Monocytes Relative: 8 %
Neutro Abs: 4.1 10*3/uL (ref 1.7–7.7)
Neutrophils Relative %: 59 %
Platelets: 184 10*3/uL (ref 150–400)
RBC: 5.03 MIL/uL (ref 3.87–5.11)
RDW: 14.6 % (ref 11.5–15.5)
WBC: 6.8 10*3/uL (ref 4.0–10.5)
nRBC: 0 % (ref 0.0–0.2)

## 2022-04-02 LAB — COMPREHENSIVE METABOLIC PANEL
ALT: 22 U/L (ref 0–44)
AST: 22 U/L (ref 15–41)
Albumin: 4 g/dL (ref 3.5–5.0)
Alkaline Phosphatase: 68 U/L (ref 38–126)
Anion gap: 7 (ref 5–15)
BUN: 18 mg/dL (ref 8–23)
CO2: 27 mmol/L (ref 22–32)
Calcium: 9.4 mg/dL (ref 8.9–10.3)
Chloride: 104 mmol/L (ref 98–111)
Creatinine, Ser: 0.97 mg/dL (ref 0.44–1.00)
GFR, Estimated: >60 mL/min (ref >60)
Glucose, Bld: 103 mg/dL — ABNORMAL HIGH (ref 70–99)
Potassium: 4.4 mmol/L (ref 3.5–5.1)
Sodium: 138 mmol/L (ref 135–145)
Total Bilirubin: 0.8 mg/dL (ref 0.3–1.2)
Total Protein: 7.1 g/dL (ref 6.5–8.1)

## 2022-04-02 NOTE — Progress Notes (Signed)
Had covid in September still has some cough, allergies.

## 2022-04-02 NOTE — Progress Notes (Signed)
Hematology/Oncology Consult Note [phone] [phone]     REFERRING PROVIDER: Abner Greenspan, MD   Patient Care Team: Tower, Wynelle Fanny, MD as PCP - General Daiva Huge, RN as Oncology Nurse Navigator  ASSESSMENT & PLAN:   Cancer Staging  DCIS (ductal carcinoma in situ) Staging form: Breast, AJCC 8th Edition - Clinical stage from 04/02/2022: Stage 0 (cTis (DCIS), cN0, cM0, ER+, PR+, HER2: Not Assessed) - Signed by Earlie Server, MD on 04/02/2022   DCIS (ductal carcinoma in situ) Images and biopsy pathology results were reviewed and discussed with patient. I recommend surgical resection-lumpectomy Patient has an appointment with Dr. Christian Mate After surgery, patient will need adjuvant radiation followed by adjuvant endocrine therapy  for 5 years. Discussed about genetic testing, patient prefers to defer for now.  Goals of care, counseling/discussion Discussed with patient.   Treatment with curative intent  Orders Placed This Encounter  Procedures   CBC with Differential/Platelet    Standing Status:   Future    Number of Occurrences:   1    Standing Expiration Date:   04/03/2023   Comprehensive metabolic panel    Standing Status:   Future    Number of Occurrences:   1    Standing Expiration Date:   04/02/2023   Follow-up TBD.  Plan to see patient 2 weeks after surgery. All questions were answered. The patient knows to call the clinic with any problems, questions or concerns.  Earlie Server, MD, PhD [facility] Hematology Oncology 04/02/2022      CHIEF COMPLAINTS/PURPOSE OF CONSULTATION:  Right breast DCIS  HISTORY OF PRESENTING ILLNESS:  Michele Cruz 66 y.o. female presents to establish care for right breast DCIS I have reviewed her chart and materials related to her cancer extensively and collaborated history with the patient. Summary of oncologic history is as follows: Oncology History  DCIS (ductal carcinoma in situ)  02/10/2022 Imaging    Bilateral screening mammogram showed further evaluation suggested for calcifications in the right breast.   02/20/2022 Mammogram   Unilateral right diagnostic mammogram showed  Round and punctate calcifications are identified in the lateral central right breast accounting for the screening findings. These calcifications span up to 6 mm and do not layer.   04/01/2022 Initial Diagnosis   DCIS (ductal carcinoma in situ)  03/28/2022 right breast biopsy DCIS, intemediate to high grade, solid and cribriform types with focal necrosis and extensive cancerization of lobules.  ER+100%, PR+50%    04/02/2022 Cancer Staging   Staging form: Breast, AJCC 8th Edition - Clinical stage from 04/02/2022: Stage 0 (cTis (DCIS), cN0, cM0, ER+, PR+, HER2: Not Assessed) - Signed by Earlie Server, MD on 04/02/2022 Stage prefix: Initial diagnosis    Menarche at age of 61 First live birth at age of 48 OCP use: 5 years Menopausal status: Postmenopausal History of HRT use: Denies History of chest radiation: Denies Number of previous breast biopsies: Denies  Patient denies any family history of breast cancer.   MEDICAL HISTORY:  Past Medical History:  Diagnosis Date   Allergic rhinitis    Anemia, iron deficiency    Anxiety    Arthritis    Asthma    no symptoms last 25 years   Colon polyps    COVID-19 07/2020   Family history of adverse reaction to anesthesia    sister PONV   Heart murmur    Heavy menses    Hemorrhoids    Hidradenitis suppurativa    History of exercise stress test  06/1999   neg   History of MRI 2003   neg per patient   History of MRI 2007   right quad fatty defect   History of MRI of spine 03/2004   C-5, C5-6 disk protrusion   Hyperlipidemia    LDL   Rosacea    Spondylosis    deg lumbar disc dz   Wisdom teeth removed    Zoster     SURGICAL HISTORY: Past Surgical History:  Procedure Laterality Date   BREAST BIOPSY Right 03/28/2022   breast center in [place]  03/2002   BREAST CYST ASPIRATION  03/2004   left   CATARACT EXTRACTION     CESAREAN SECTION     COLONOSCOPY  01/26/2012   HEMORRHOID BANDING  08/2021   KNEE ARTHROPLASTY Left 03/15/2018   Procedure: COMPUTER ASSISTED TOTAL KNEE ARTHROPLASTY;  Surgeon: Dereck Leep, MD;  Location: [facility] ORS;  Service: Orthopedics;  Laterality: Left;   KNEE ARTHROPLASTY Right 07/28/2018   Procedure: COMPUTER ASSISTED TOTAL KNEE ARTHROPLASTY;  Surgeon: Dereck Leep, MD;  Location: [facility] ORS;  Service: Orthopedics;  Laterality: Right;   KNEE SURGERY     left    SOCIAL HISTORY: Social History   Socioeconomic History   Marital status: Married    Spouse name: Not on file   Number of children: 1   Years of education: Not on file   Highest education level: Not on file  Occupational History   Occupation: INSTRUCTIONAL ASST    Comment: Secretary/administrator  Tobacco Use   Smoking status: Never   Smokeless tobacco: Never  Vaping Use   Vaping Use: Never used  Substance and Sexual Activity   Alcohol use: Yes    Alcohol/week: 4.0 standard drinks of alcohol    Types: 2 Glasses of wine, 2 Cans of beer per week    Comment: 2-4 glasses wine or beer weekly per pt.   Drug use: No   Sexual activity: Not on file  Other Topics Concern   Not on file  Social History Narrative   One daughter   Daily caffeine; use 1-2 daily   Social Determinants of Health   Financial Resource Strain: Not on file  Food Insecurity: Not on file  Transportation Needs: Not on file  Physical Activity: Not on file  Stress: Not on file  Social Connections: Not on file  Intimate Partner Violence: Not on file    FAMILY HISTORY: Family History  Problem Relation Age of Onset   Arthritis Mother        OA   Obesity Mother    Diverticulitis Mother    Colon polyps Mother    Heart disease Father        CAD   Alcohol abuse Father    Diabetes Father    Hyperlipidemia Sister    Diverticulitis Sister    Diabetes  Other    Diverticulitis Sister    Colon cancer Neg Hx    Breast cancer Neg Hx    Esophageal cancer Neg Hx    Rectal cancer Neg Hx    Stomach cancer Neg Hx     ALLERGIES:  is allergic to decadron [dexamethasone], crestor [rosuvastatin], depo-medrol [methylprednisolone acetate], lipitor [atorvastatin calcium], meloxicam, and simvastatin.  MEDICATIONS:  Current Outpatient Medications  Medication Sig Dispense Refill   Ascorbic Acid (VITAMIN C) 100 MG tablet Take 100 mg by mouth daily.     co-enzyme Q-10 30 MG capsule Take 30 mg by  mouth 3 (three) times daily.     COLLAGEN PO Take by mouth daily. BioTRUST Multi-collagen, 10 g bioactive daily     loratadine (CLARITIN) 10 MG tablet Take 10 mg by mouth daily.     Magnesium Oxide 250 MG TABS Take 250 mg by mouth daily.      Misc Natural Products (TURMERIC CURCUMIN) CAPS Take 1 capsule by mouth daily. Theracurmin     Multiple Vitamin (MULTIVITAMIN) tablet Take 1 tablet by mouth daily.     Omega-3 Fatty Acids (FISH OIL) 1200 MG CAPS Take 1,200 mg by mouth daily.      Fluocinolone Acetonide 0.01 % OIL Place 1 drop into both ears as needed.  (Patient not taking: Reported on 04/02/2022)     hydrocortisone (ANUSOL-HC) 25 MG suppository Place 1 suppository (25 mg total) rectally at bedtime. (Patient not taking: Reported on 04/02/2022) 7 suppository 3   No current facility-administered medications for this visit.    Review of Systems  Constitutional:  Negative for appetite change, chills, fatigue and fever.  HENT:   Negative for hearing loss and voice change.   Eyes:  Negative for eye problems.  Respiratory:  Negative for chest tightness and cough.   Cardiovascular:  Negative for chest pain.  Gastrointestinal:  Negative for abdominal distention, abdominal pain and blood in stool.  Endocrine: Negative for hot flashes.  Genitourinary:  Negative for difficulty urinating and frequency.   Musculoskeletal:  Negative for arthralgias.  Skin:  Negative  for itching and rash.  Neurological:  Negative for extremity weakness.  Hematological:  Negative for adenopathy.  Psychiatric/Behavioral:  Negative for confusion.      PHYSICAL EXAMINATION: ECOG PERFORMANCE STATUS: 0 - Asymptomatic  Vitals:   04/02/22 1100 04/02/22 1102  BP:  139/79  Pulse:  73  Resp: 18 18  Temp:  (!) 97.5 F (36.4 C)  SpO2:  98%   Filed Weights   04/02/22 1049  Weight: 219 lb (99.3 kg)    Physical Exam Constitutional:      General: She is not in acute distress.    Appearance: She is not diaphoretic.  HENT:     Head: Normocephalic and atraumatic.     Nose: Nose normal.     Mouth/Throat:     Pharynx: No oropharyngeal exudate.  Eyes:     General: No scleral icterus.    Pupils: Pupils are equal, round, and reactive to light.  Cardiovascular:     Rate and Rhythm: Normal rate and regular rhythm.     Heart sounds: No murmur heard. Pulmonary:     Effort: Pulmonary effort is normal. No respiratory distress.     Breath sounds: No rales.  Chest:     Chest wall: No tenderness.  Abdominal:     General: There is no distension.     Palpations: Abdomen is soft.     Tenderness: There is no abdominal tenderness.  Musculoskeletal:        General: Normal range of motion.     Cervical back: Normal range of motion and neck supple.  Skin:    General: Skin is warm and dry.     Findings: No erythema.  Neurological:     Mental Status: She is alert and oriented to person, place, and time.     Cranial Nerves: No cranial nerve deficit.     Motor: No abnormal muscle tone.     Coordination: Coordination normal.  Psychiatric:        Mood and Affect: Affect  normal.    Breast exam was performed in seated and lying down position. Patient is status post right breast biopsy with focal bruising.  No palpable breast masses bilaterally.  No palpable axillary adenopathy bilaterally.   LABORATORY DATA:  I have reviewed the data as listed    Latest Ref Rng & Units  04/02/2022   12:05 PM 01/17/2022    7:44 AM 01/16/2021    8:33 AM  CBC  WBC 4.0 - 10.5 K/uL 6.8  6.2  5.2   Hemoglobin 12.0 - 15.0 g/dL 14.6  14.3  11.9   Hematocrit 36.0 - 46.0 % 43.7  42.4  36.4   Platelets 150 - 400 K/uL 184  199.0  218.0       Latest Ref Rng & Units 04/02/2022   12:05 PM 01/17/2022    7:44 AM 01/16/2021    8:33 AM  CMP  Glucose 70 - 99 mg/dL 103  95  95   BUN 8 - 23 mg/dL $Remove'18  17  16   'VzzpDjW$ Creatinine 0.44 - 1.00 mg/dL 0.97  0.82  0.78   Sodium 135 - 145 mmol/L 138  140  139   Potassium 3.5 - 5.1 mmol/L 4.4  4.6  4.5   Chloride 98 - 111 mmol/L 104  105  105   CO2 22 - 32 mmol/L $RemoveB'27  28  28   'oSOXwxjw$ Calcium 8.9 - 10.3 mg/dL 9.4  9.3  9.2   Total Protein 6.5 - 8.1 g/dL 7.1  6.4  6.4   Total Bilirubin 0.3 - 1.2 mg/dL 0.8  0.7  0.7   Alkaline Phos 38 - 126 U/L 68  71  59   AST 15 - 41 U/L $Remo'22  18  22   'nzAiA$ ALT 0 - 44 U/L $Remo'22  14  17      'gCcni$ RADIOGRAPHIC STUDIES: I have personally reviewed the radiological images as listed and agreed with the findings in the report. MM CLIP PLACEMENT RIGHT  Result Date: 03/28/2022 CLINICAL DATA:  Evaluate biopsy marker EXAM: 3D DIAGNOSTIC RIGHT MAMMOGRAM POST STEREOTACTIC BIOPSY COMPARISON:  Previous exam(s). FINDINGS: 3D Mammographic images were obtained following stereotactic guided biopsy of right breast calcifications. The biopsy marking clip is in expected position at the site of biopsy. IMPRESSION: Appropriate positioning of the X shaped biopsy marking clip at the site of biopsy in the region of the biopsied right breast calcifications. Final Assessment: Post Procedure Mammograms for Marker Placement Electronically Signed   By: Dorise Bullion III M.D.   On: 03/28/2022 14:37  MM RT BREAST BX W LOC DEV 1ST LESION IMAGE BX SPEC STEREO GUIDE  Result Date: 03/28/2022 CLINICAL DATA:  Biopsy of right breast calcifications EXAM: RIGHT BREAST STEREOTACTIC CORE NEEDLE BIOPSY COMPARISON:  Previous exam(s). FINDINGS: The patient and I discussed the procedure  of stereotactic-guided biopsy including benefits and alternatives. We discussed the high likelihood of a successful procedure. We discussed the risks of the procedure including infection, bleeding, tissue injury, clip migration, and inadequate sampling. Informed written consent was given. The usual time out protocol was performed immediately prior to the procedure. Using sterile technique and 1% Lidocaine as local anesthetic, under stereotactic guidance, a 9 gauge vacuum assisted device was used to perform core needle biopsy of calcifications in the lateral right breast using a superior approach. Specimen radiograph was performed showing calcifications in 2 of the core specimens. Specimens with calcifications are identified for pathology. Lesion quadrant: Lateral right breast At the conclusion of the procedure, an X  shaped tissue marker clip was deployed into the biopsy cavity. Follow-up 2-view mammogram was performed and dictated separately. IMPRESSION: Stereotactic-guided biopsy of calcifications in the lateral right breast. No apparent complications. Electronically Signed   By: Dorise Bullion III M.D.   On: 03/28/2022 14:25

## 2022-04-02 NOTE — Assessment & Plan Note (Addendum)
Images and biopsy pathology results were reviewed and discussed with patient. I recommend surgical resection-lumpectomy Patient has an appointment with Dr. Christian Mate After surgery, patient will need adjuvant radiation followed by adjuvant endocrine therapy  for 5 years. Discussed about genetic testing, patient prefers to defer for now.

## 2022-04-02 NOTE — Assessment & Plan Note (Signed)
Discussed with patient.   Treatment with curative intent

## 2022-04-04 ENCOUNTER — Other Ambulatory Visit: Payer: Self-pay

## 2022-04-04 DIAGNOSIS — D0511 Intraductal carcinoma in situ of right breast: Secondary | ICD-10-CM

## 2022-04-04 NOTE — Research (Signed)
Trial:  [id number] - Tissue and Bodily Fluids: Translational Medicine: Discovery and Evaluation of Biomarkers/Pharmacogenomics for the Diagnosis and Personalized Management of Patients   Patient Michele Cruz was identified by this nurse as a potential candidate for the above listed study.  This clinic nurse called the patient to introduce her to the study this morning per Dr. Collie Siad request. The  protocol consent was reviewed with the patient briefly with specific explanation provided regarding study procedures, risks, alternatives and potential benefits. The patient states she would like to participate as soon as possible. Patient is being scheduled to come in to the clinic on Monday, Oct. 16 th at 0900 am per her request. Patient understands we will meet prior to the lab appointment to review the entire Consent / [employer] documents and answer any and all questions she may have. The patient was informed that her participation is completely voluntary and she does not have to participate in the blood study if she doesn't want to , or can be removed from the study at any time per her request. She was thanked for being willing to participate in research here at [facility]. Jeral Fruit, RN 04/04/22 12:08 PM

## 2022-04-07 ENCOUNTER — Inpatient Hospital Stay: Payer: Medicare PPO

## 2022-04-07 DIAGNOSIS — D0511 Intraductal carcinoma in situ of right breast: Secondary | ICD-10-CM

## 2022-04-07 MED ORDER — FLUOCINOLONE ACETONIDE 0.01 % OT OIL: 1.0000 [drp] | TOPICAL_OIL | OTIC | 0 refills | Status: DC | PRN

## 2022-04-07 MED ORDER — HYDROCORTISONE ACETATE 25 MG RE SUPP: 25.0000 mg | Freq: Two times a day (BID) | RECTAL | 3 refills | Status: DC | PRN

## 2022-04-07 MED ORDER — VITAMIN C 100 MG PO TABS: 500.0000 mg | ORAL_TABLET | Freq: Every day | ORAL | Status: DC

## 2022-04-07 NOTE — Progress Notes (Unsigned)
Patient ID: Michele Cruz, female   DOB: 1955-11-24, 66 y.o.   MRN: [id number]  Chief Complaint: DCIS right breast  History of Present Illness Michele Cruz is a 66 y.o. female with DCIS diagnosed following diagnostic imaging prompted by screening. She began having menses between 10 and 11.  She is gravida 2 para 1 with 1 miscarriage.  She had her first child when she was 57 years old.  She breast-fed.  She denies palpating any lump, having any breast pain, having any skin changes or discharge from the nipple.  She does not do her monthly self breast exams.  She did utilize birth control reports herself is menopausal.  Past Medical History Past Medical History:  Diagnosis Date   Allergic rhinitis    Anemia, iron deficiency    Anxiety    Arthritis    Asthma    no symptoms last 25 years   Colon polyps    COVID-19 07/2020   Family history of adverse reaction to anesthesia    sister PONV   Heart murmur    Heavy menses    Hemorrhoids    Hidradenitis suppurativa    History of exercise stress test 06/1999   neg   History of MRI 2003   neg per patient   History of MRI 2007   right quad fatty defect   History of MRI of spine 03/2004   C-5, C5-6 disk protrusion   Hyperlipidemia    LDL   Rosacea    Spondylosis    deg lumbar disc dz   Wisdom teeth removed    Zoster       Past Surgical History:  Procedure Laterality Date   BREAST BIOPSY Right 03/28/2022   breast center in [place]  03/2002   BREAST CYST ASPIRATION  03/2004   left   CATARACT EXTRACTION     CESAREAN SECTION     COLONOSCOPY  01/26/2012   HEMORRHOID BANDING  08/2021   KNEE ARTHROPLASTY Left 03/15/2018   Procedure: COMPUTER ASSISTED TOTAL KNEE ARTHROPLASTY;  Surgeon: Dereck Leep, MD;  Location: [facility];  Service: Orthopedics;  Laterality: Left;   KNEE ARTHROPLASTY Right 07/28/2018   Procedure: COMPUTER ASSISTED TOTAL KNEE ARTHROPLASTY;  Surgeon: Dereck Leep, MD;  Location:  [facility];  Service: Orthopedics;  Laterality: Right;   KNEE SURGERY     left    Allergies  Allergen Reactions   Decadron [Dexamethasone] Other (See Comments)    Unsure if related---swelling of throat   Crestor [Rosuvastatin]     Muscle pain    Depo-Medrol [Methylprednisolone Acetate]     Rash after injection, a noted common SE from depo-medrol listed in epocrates and most resources   Lipitor [Atorvastatin Calcium] Other (See Comments)    Leg cramps and pain   Meloxicam     Pt felt dizzy potentially from this, but does well on aleve   Simvastatin     Possible transient memory changes, resolved off medicine. Muscle pain    Current Outpatient Medications  Medication Sig Dispense Refill   Ascorbic Acid (VITAMIN C) 100 MG tablet Take 5 tablets (500 mg total) by mouth daily.     co-enzyme Q-10 30 MG capsule Take 100 mg by mouth 1 day or 1 dose.     COLLAGEN PO Take by mouth daily. BioTRUST Multi-collagen, 10 g bioactive daily     fexofenadine (ALLEGRA) 180 MG tablet Take 180 mg by mouth daily.     Fluocinolone Acetonide  0.01 % OIL Place 1 drop into both ears as needed (Not taking anymore). Not taking anymore. 30 mL 0   hydrocortisone (ANUSOL-HC) 25 MG suppository Place 1 suppository (25 mg total) rectally 2 (two) times daily as needed for hemorrhoids or anal itching (only taking as needed.). 7 suppository 3   Magnesium Oxide 250 MG TABS Take 500 mg by mouth daily.       Misc Natural Products (TURMERIC CURCUMIN) CAPS Take 1 capsule by mouth daily. Theracurmin     Multiple Vitamin (MULTIVITAMIN) tablet Take 1 tablet by mouth daily.     Omega-3 Fatty Acids (FISH OIL) 1200 MG CAPS Take 2,000 mg by mouth daily.     No current facility-administered medications for this visit.    Family History Family History  Problem Relation Age of Onset   Arthritis Mother        OA   Obesity Mother    Diverticulitis Mother    Colon polyps Mother    Heart disease Father        CAD   Alcohol abuse  Father    Diabetes Father    Hyperlipidemia Sister    Diverticulitis Sister    Diabetes Other    Diverticulitis Sister    Colon cancer Neg Hx    Breast cancer Neg Hx    Esophageal cancer Neg Hx    Rectal cancer Neg Hx    Stomach cancer Neg Hx       Social History Social History   Tobacco Use   Smoking status: Never   Smokeless tobacco: Never  Vaping Use   Vaping Use: Never used  Substance Use Topics   Alcohol use: Yes    Alcohol/week: 4.0 standard drinks of alcohol    Types: 2 Glasses of wine, 2 Cans of beer per week    Comment: 2-4 glasses wine or beer weekly per pt.   Drug use: No        Review of Systems  Constitutional: Negative.   HENT: Negative.    Eyes: Negative.   Respiratory: Negative.    Cardiovascular: Negative.   Gastrointestinal: Negative.   Genitourinary: Negative.   Skin: Negative.   Neurological: Negative.   Psychiatric/Behavioral: Negative.        Physical Exam Blood pressure 136/77, pulse 94, temperature 98.4 F (36.9 C), temperature source Oral, height 5' 6.5" (1.689 m), weight 218 lb (98.9 kg), SpO2 95 %. Last Weight  Most recent update: 04/08/2022  3:43 PM    Weight  98.9 kg (218 lb)             CONSTITUTIONAL: Well developed, and nourished, appropriately responsive and aware without distress.   EYES: Sclera non-icteric.   EARS, NOSE, MOUTH AND THROAT:  The oropharynx is clear. Oral mucosa is pink and moist.    Hearing is intact to voice.  NECK: Trachea is midline, and there is no jugular venous distension.  LYMPH NODES:  Lymph nodes in the neck are not enlarged. RESPIRATORY:  Lungs are clear, and breath sounds are equal bilaterally. Normal respiratory effort without pathologic use of accessory muscles. CARDIOVASCULAR: Heart is regular in rate and rhythm. GI: The abdomen is  soft, nontender, and nondistended. MUSCULOSKELETAL:  Symmetrical muscle tone appreciated in all four extremities.    SKIN: Skin turgor is normal. No  pathologic skin lesions appreciated.  NEUROLOGIC:  Motor and sensation appear grossly normal.  Cranial nerves are grossly without defect. PSYCH:  Alert and oriented to person, place and time. Affect  is appropriate for situation.  Data Reviewed I have personally reviewed what is currently available of the patient's imaging, recent labs and medical records.   Labs:     Latest Ref Rng & Units 04/02/2022   12:05 PM 01/17/2022    7:44 AM 01/16/2021    8:33 AM  CBC  WBC 4.0 - 10.5 K/uL 6.8  6.2  5.2   Hemoglobin 12.0 - 15.0 g/dL 14.6  14.3  11.9   Hematocrit 36.0 - 46.0 % 43.7  42.4  36.4   Platelets 150 - 400 K/uL 184  199.0  218.0       Latest Ref Rng & Units 04/02/2022   12:05 PM 01/17/2022    7:44 AM 01/16/2021    8:33 AM  CMP  Glucose 70 - 99 mg/dL 103  95  95   BUN 8 - 23 mg/dL '18  17  16   '$ Creatinine 0.44 - 1.00 mg/dL 0.97  0.82  0.78   Sodium 135 - 145 mmol/L 138  140  139   Potassium 3.5 - 5.1 mmol/L 4.4  4.6  4.5   Chloride 98 - 111 mmol/L 104  105  105   CO2 22 - 32 mmol/L '27  28  28   '$ Calcium 8.9 - 10.3 mg/dL 9.4  9.3  9.2   Total Protein 6.5 - 8.1 g/dL 7.1  6.4  6.4   Total Bilirubin 0.3 - 1.2 mg/dL 0.8  0.7  0.7   Alkaline Phos 38 - 126 U/L 68  71  59   AST 15 - 41 U/L '22  18  22   '$ ALT 0 - 44 U/L '22  14  17    '$ PROGNOSTIC INDICATORS Results: IMMUNOHISTOCHEMICAL AND MORPHOMETRIC ANALYSIS PERFORMED MANUALLY Estrogen Receptor: 100%, POSITIVE, STRONG STAINING INTENSITY Progesterone Receptor: 50%, POSITIVE, STRONG STAINING INTENSITY REFERENCE RANGE ESTROGEN RECEPTOR NEGATIVE 0% POSITIVE =>1% REFERENCE RANGE PROGESTERONE RECEPTOR NEGATIVE 0% POSITIVE =>1% All controls stained appropriately Casimer Lanius MD Pathologist, Electronic Signature ( Signed 04/01/2022) FINAL DIAGNOSIS Diagnosis Breast, right, needle core biopsy, lateral DUCTAL CARCINOMA IN SITU, 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   EXAM: DIGITAL DIAGNOSTIC UNILATERAL RIGHT MAMMOGRAM   TECHNIQUE: Right digital diagnostic mammography was performed.   COMPARISON:  Previous exam(s).   ACR Breast Density Category c: The breast tissue is heterogeneously dense, which may obscure small masses.   FINDINGS: Round and punctate calcifications are identified in the lateral central right breast accounting for the screening findings. These calcifications span up to 6 mm and do not layer.   IMPRESSION: Indeterminate right breast calcifications located laterally and centrally.   RECOMMENDATION: Recommend stereotactic biopsy of the right breast calcifications.   I have discussed the findings and recommendations with the patient. If applicable, a reminder letter will be sent to the patient regarding the next appointment.   BI-RADS CATEGORY  4: Suspicious.     Electronically Signed   By: Dorise Bullion III M.D.   On: 02/20/2022 15:50 Within last 24 hrs: No results found.   Assessment    Right breast DCIS. 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   Encounter for screening  mammogram for breast cancer 01/24/2022   Estrogen deficiency 01/24/2022   Internal and external bleeding hemorrhoids 08/16/2021   Total knee replacement status 07/28/2018   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   Internal hemorrhoids 07/21/2017   Hydradenitis 07/21/2017   Obesity (BMI 30-39.9) 12/09/2016   Encounter for routine gynecological examination 01/10/2014   Routine general medical examination at a health care facility 03/25/2011   HYPERCHOLESTEROLEMIA 12/11/2009   COLONIC POLYPS, HX OF  08/03/2009   HERNIATED CERVICAL DISC 06/20/2009   Herniated cervical disc 06/20/2009   BACK PAIN, LUMBAR, WITH RADICULOPATHY 10/13/2008   Generalized anxiety disorder 05/24/2007   ALLERGIC RHINITIS 05/24/2007   ROSACEA 05/24/2007   CARDIAC MURMUR 05/24/2007    Plan    Tag localized right breast lumpectomy.  I discussed the available options with the patient. The risk of recurrence is similar between mastectomy and lumpectomy with radiation.  I also discussed that given the small size of the cancer would recommend localization lumpectomy with radiation to follow.  I also discussed that we would need to do a sentinel lymph node biopsy to check the nodes, because her staging could change.   Explained to the patient that after her surgical treatment additional treatment will depend on her prognostic indicators and stage.    I discussed risks of bleeding, infection, damage to surrounding tissues, having positive margins, needing further resection, damage to nerves causing arm numbness or difficulty raising arm, causing lymphedema in the arm; as well as anesthesia risks of MI, stroke, prolonged ventilation, pulmonary embolism, thrombosis and even death.   Patient was given the opportunity to ask questions and have them answered.  They would like to proceed with right breast RFID localized lumpectomy.   Face-to-face time spent with the patient and accompanying care providers(if present) was 30 minutes, with more than 50% of the time spent counseling, educating, and coordinating care of the patient.    These notes generated with voice recognition software. I apologize for typographical errors.  Ronny Bacon M.D., FACS 04/10/2022, 11:13 PM

## 2022-04-07 NOTE — Research (Signed)
MTG-015 - Tissue and Bodily Fluids: Translational Medicine: Discovery and Evaluation of Biomarkers/Pharmacogenomics for the Diagnosis and Personalized Management of Patients   This Nurse has reviewed this patient's inclusion and exclusion criteria as a second review and confirms Michele Cruz is eligible for study participation.  Patient may continue with enrollment.  [name] Nedim Oki, RN, BSN, [facility] She  Her  [facility] Clinical Research Nurse [facility] Direct Dial [phone]  Pager [phone] 04/07/2022 10:27 AM

## 2022-04-07 NOTE — H&P (View-Only) (Signed)
Patient ID: Michele Cruz, female   DOB: 1956-06-23, 66 y.o.   MRN: [id number]  Chief Complaint: DCIS right breast  History of Present Illness Michele Cruz is a 66 y.o. female with DCIS diagnosed following diagnostic imaging prompted by screening. She began having menses between 10 and 11.  She is gravida 2 para 1 with 1 miscarriage.  She had her first child when she was 18 years old.  She breast-fed.  She denies palpating any lump, having any breast pain, having any skin changes or discharge from the nipple.  She does not do her monthly self breast exams.  She did utilize birth control reports herself is menopausal.  Past Medical History Past Medical History:  Diagnosis Date   Allergic rhinitis    Anemia, iron deficiency    Anxiety    Arthritis    Asthma    no symptoms last 25 years   Colon polyps    COVID-19 07/2020   Family history of adverse reaction to anesthesia    sister PONV   Heart murmur    Heavy menses    Hemorrhoids    Hidradenitis suppurativa    History of exercise stress test 06/1999   neg   History of MRI 2003   neg per patient   History of MRI 2007   right quad fatty defect   History of MRI of spine 03/2004   C-5, C5-6 disk protrusion   Hyperlipidemia    LDL   Rosacea    Spondylosis    deg lumbar disc dz   Wisdom teeth removed    Zoster       Past Surgical History:  Procedure Laterality Date   BREAST BIOPSY Right 03/28/2022   breast center in [place]  03/2002   BREAST CYST ASPIRATION  03/2004   left   CATARACT EXTRACTION     CESAREAN SECTION     COLONOSCOPY  01/26/2012   HEMORRHOID BANDING  08/2021   KNEE ARTHROPLASTY Left 03/15/2018   Procedure: COMPUTER ASSISTED TOTAL KNEE ARTHROPLASTY;  Surgeon: Dereck Leep, MD;  Location: [facility];  Service: Orthopedics;  Laterality: Left;   KNEE ARTHROPLASTY Right 07/28/2018   Procedure: COMPUTER ASSISTED TOTAL KNEE ARTHROPLASTY;  Surgeon: Dereck Leep, MD;  Location:  [facility];  Service: Orthopedics;  Laterality: Right;   KNEE SURGERY     left    Allergies  Allergen Reactions   Decadron [Dexamethasone] Other (See Comments)    Unsure if related---swelling of throat   Crestor [Rosuvastatin]     Muscle pain    Depo-Medrol [Methylprednisolone Acetate]     Rash after injection, a noted common SE from depo-medrol listed in epocrates and most resources   Lipitor [Atorvastatin Calcium] Other (See Comments)    Leg cramps and pain   Meloxicam     Pt felt dizzy potentially from this, but does well on aleve   Simvastatin     Possible transient memory changes, resolved off medicine. Muscle pain    Current Outpatient Medications  Medication Sig Dispense Refill   Ascorbic Acid (VITAMIN C) 100 MG tablet Take 5 tablets (500 mg total) by mouth daily.     co-enzyme Q-10 30 MG capsule Take 100 mg by mouth 1 day or 1 dose.     COLLAGEN PO Take by mouth daily. BioTRUST Multi-collagen, 10 g bioactive daily     fexofenadine (ALLEGRA) 180 MG tablet Take 180 mg by mouth daily.     Fluocinolone Acetonide  0.01 % OIL Place 1 drop into both ears as needed (Not taking anymore). Not taking anymore. 30 mL 0   hydrocortisone (ANUSOL-HC) 25 MG suppository Place 1 suppository (25 mg total) rectally 2 (two) times daily as needed for hemorrhoids or anal itching (only taking as needed.). 7 suppository 3   Magnesium Oxide 250 MG TABS Take 500 mg by mouth daily.       Misc Natural Products (TURMERIC CURCUMIN) CAPS Take 1 capsule by mouth daily. Theracurmin     Multiple Vitamin (MULTIVITAMIN) tablet Take 1 tablet by mouth daily.     Omega-3 Fatty Acids (FISH OIL) 1200 MG CAPS Take 2,000 mg by mouth daily.     No current facility-administered medications for this visit.    Family History Family History  Problem Relation Age of Onset   Arthritis Mother        OA   Obesity Mother    Diverticulitis Mother    Colon polyps Mother    Heart disease Father        CAD   Alcohol abuse  Father    Diabetes Father    Hyperlipidemia Sister    Diverticulitis Sister    Diabetes Other    Diverticulitis Sister    Colon cancer Neg Hx    Breast cancer Neg Hx    Esophageal cancer Neg Hx    Rectal cancer Neg Hx    Stomach cancer Neg Hx       Social History Social History   Tobacco Use   Smoking status: Never   Smokeless tobacco: Never  Vaping Use   Vaping Use: Never used  Substance Use Topics   Alcohol use: Yes    Alcohol/week: 4.0 standard drinks of alcohol    Types: 2 Glasses of wine, 2 Cans of beer per week    Comment: 2-4 glasses wine or beer weekly per pt.   Drug use: No        Review of Systems  Constitutional: Negative.   HENT: Negative.    Eyes: Negative.   Respiratory: Negative.    Cardiovascular: Negative.   Gastrointestinal: Negative.   Genitourinary: Negative.   Skin: Negative.   Neurological: Negative.   Psychiatric/Behavioral: Negative.        Physical Exam Blood pressure 136/77, pulse 94, temperature 98.4 F (36.9 C), temperature source Oral, height 5' 6.5" (1.689 m), weight 218 lb (98.9 kg), SpO2 95 %. Last Weight  Most recent update: 04/08/2022  3:43 PM    Weight  98.9 kg (218 lb)             CONSTITUTIONAL: Well developed, and nourished, appropriately responsive and aware without distress.   EYES: Sclera non-icteric.   EARS, NOSE, MOUTH AND THROAT:  The oropharynx is clear. Oral mucosa is pink and moist.    Hearing is intact to voice.  NECK: Trachea is midline, and there is no jugular venous distension.  LYMPH NODES:  Lymph nodes in the neck are not enlarged. RESPIRATORY:  Lungs are clear, and breath sounds are equal bilaterally. Normal respiratory effort without pathologic use of accessory muscles. CARDIOVASCULAR: Heart is regular in rate and rhythm. GI: The abdomen is  soft, nontender, and nondistended. MUSCULOSKELETAL:  Symmetrical muscle tone appreciated in all four extremities.    SKIN: Skin turgor is normal. No  pathologic skin lesions appreciated.  NEUROLOGIC:  Motor and sensation appear grossly normal.  Cranial nerves are grossly without defect. PSYCH:  Alert and oriented to person, place and time. Affect  is appropriate for situation.  Data Reviewed I have personally reviewed what is currently available of the patient's imaging, recent labs and medical records.   Labs:     Latest Ref Rng & Units 04/02/2022   12:05 PM 01/17/2022    7:44 AM 01/16/2021    8:33 AM  CBC  WBC 4.0 - 10.5 K/uL 6.8  6.2  5.2   Hemoglobin 12.0 - 15.0 g/dL 14.6  14.3  11.9   Hematocrit 36.0 - 46.0 % 43.7  42.4  36.4   Platelets 150 - 400 K/uL 184  199.0  218.0       Latest Ref Rng & Units 04/02/2022   12:05 PM 01/17/2022    7:44 AM 01/16/2021    8:33 AM  CMP  Glucose 70 - 99 mg/dL 103  95  95   BUN 8 - 23 mg/dL '18  17  16   '$ Creatinine 0.44 - 1.00 mg/dL 0.97  0.82  0.78   Sodium 135 - 145 mmol/L 138  140  139   Potassium 3.5 - 5.1 mmol/L 4.4  4.6  4.5   Chloride 98 - 111 mmol/L 104  105  105   CO2 22 - 32 mmol/L '27  28  28   '$ Calcium 8.9 - 10.3 mg/dL 9.4  9.3  9.2   Total Protein 6.5 - 8.1 g/dL 7.1  6.4  6.4   Total Bilirubin 0.3 - 1.2 mg/dL 0.8  0.7  0.7   Alkaline Phos 38 - 126 U/L 68  71  59   AST 15 - 41 U/L '22  18  22   '$ ALT 0 - 44 U/L '22  14  17    '$ PROGNOSTIC INDICATORS Results: IMMUNOHISTOCHEMICAL AND MORPHOMETRIC ANALYSIS PERFORMED MANUALLY Estrogen Receptor: 100%, POSITIVE, STRONG STAINING INTENSITY Progesterone Receptor: 50%, POSITIVE, STRONG STAINING INTENSITY REFERENCE RANGE ESTROGEN RECEPTOR NEGATIVE 0% POSITIVE =>1% REFERENCE RANGE PROGESTERONE RECEPTOR NEGATIVE 0% POSITIVE =>1% All controls stained appropriately Casimer Lanius MD Pathologist, Electronic Signature ( Signed 04/01/2022) FINAL DIAGNOSIS Diagnosis Breast, right, needle core biopsy, lateral DUCTAL CARCINOMA IN SITU, 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   EXAM: DIGITAL DIAGNOSTIC UNILATERAL RIGHT MAMMOGRAM   TECHNIQUE: Right digital diagnostic mammography was performed.   COMPARISON:  Previous exam(s).   ACR Breast Density Category c: The breast tissue is heterogeneously dense, which may obscure small masses.   FINDINGS: Round and punctate calcifications are identified in the lateral central right breast accounting for the screening findings. These calcifications span up to 6 mm and do not layer.   IMPRESSION: Indeterminate right breast calcifications located laterally and centrally.   RECOMMENDATION: Recommend stereotactic biopsy of the right breast calcifications.   I have discussed the findings and recommendations with the patient. If applicable, a reminder letter will be sent to the patient regarding the next appointment.   BI-RADS CATEGORY  4: Suspicious.     Electronically Signed   By: Dorise Bullion III M.D.   On: 02/20/2022 15:50 Within last 24 hrs: No results found.   Assessment    Right breast DCIS. 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   Encounter for screening  mammogram for breast cancer 01/24/2022   Estrogen deficiency 01/24/2022   Internal and external bleeding hemorrhoids 08/16/2021   Total knee replacement status 07/28/2018   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   Internal hemorrhoids 07/21/2017   Hydradenitis 07/21/2017   Obesity (BMI 30-39.9) 12/09/2016   Encounter for routine gynecological examination 01/10/2014   Routine general medical examination at a health care facility 03/25/2011   HYPERCHOLESTEROLEMIA 12/11/2009   COLONIC POLYPS, HX OF  08/03/2009   HERNIATED CERVICAL DISC 06/20/2009   Herniated cervical disc 06/20/2009   BACK PAIN, LUMBAR, WITH RADICULOPATHY 10/13/2008   Generalized anxiety disorder 05/24/2007   ALLERGIC RHINITIS 05/24/2007   ROSACEA 05/24/2007   CARDIAC MURMUR 05/24/2007    Plan    Tag localized right breast lumpectomy.  I discussed the available options with the patient. The risk of recurrence is similar between mastectomy and lumpectomy with radiation.  I also discussed that given the small size of the cancer would recommend localization lumpectomy with radiation to follow.  I also discussed that we would need to do a sentinel lymph node biopsy to check the nodes, because her staging could change.   Explained to the patient that after her surgical treatment additional treatment will depend on her prognostic indicators and stage.    I discussed risks of bleeding, infection, damage to surrounding tissues, having positive margins, needing further resection, damage to nerves causing arm numbness or difficulty raising arm, causing lymphedema in the arm; as well as anesthesia risks of MI, stroke, prolonged ventilation, pulmonary embolism, thrombosis and even death.   Patient was given the opportunity to ask questions and have them answered.  They would like to proceed with right breast RFID localized lumpectomy.   Face-to-face time spent with the patient and accompanying care providers(if present) was 30 minutes, with more than 50% of the time spent counseling, educating, and coordinating care of the patient.    These notes generated with voice recognition software. I apologize for typographical errors.  Ronny Bacon M.D., FACS 04/10/2022, 11:13 PM

## 2022-04-07 NOTE — Research (Signed)
Trial Name:  [id number] - Tissue and Bodily Fluids: Translational Medicine: Discovery and Evaluation of Biomarker's/Pharmacogenomics for the Diagnosis and Personalized Management of Patients    Patient Michele Cruz was identified by this nurse  as a potential candidate for the above listed study.  This Clinical Research Nurse met with Michele Cruz, [id number] on 04/07/22 in a manner and location that ensures patient privacy to discuss participation in the above listed research study.  Patient is Unaccompanied.  Patient was previously provided with informed consent documents.  Patient has not yet read the informed consent documents and so documents were reviewed page by page today.  As outlined in the informed consent form, this Nurse and Michele Cruz discussed the purpose of the research study, the investigational nature of the study, study procedures and requirements for study participation, potential risks and benefits of study participation, as well as alternatives to participation.  This study is not blinded or double-blinded. The patient understands participation is voluntary and they may withdraw from study participation at any time.  This study does not involve randomization.  This study does not involve an investigational drug or device. This study does not involve a placebo. Patient understands enrollment is pending full eligibility review.   Confidentiality and how the patient's information will be used as part of study participation were discussed.  Patient was informed there is reimbursement provided for their time and effort spent on trial participation.  The patient is encouraged to discuss research study participation with their insurance provider to determine what costs they may incur as part of study participation, including research related injury.    All questions were answered to patient's satisfaction.  The informed consent and separate HIPAA Authorization was reviewed page by  page.  The patient's mental and emotional status is appropriate to provide informed consent, and the patient verbalizes an understanding of study participation.  Patient has agreed to participate in the above listed research study and has voluntarily signed the informed consent version 7.0 dated 11/21/2023and separate HIPAA Authorization, version 5  dated 05/13/2022 on 04/07/22 at 0925AM.  The patient was provided with a copy of the signed informed consent form and separate HIPAA Authorization for their reference.  No study specific procedures were obtained prior to the signing of the informed consent document.  Approximately 20 minutes were spent with the patient reviewing the informed consent documents.  After obtaining informed consent patient, voluntarily signed the optional Release of Information form for use throughout trial participation.  Second eligibility for the protocol was reviewed by [name], RN who deemed her eligible to participate via e-mail communication.   The following social, medical, and family history was collected from the patient on 04/07/22.  Date and time of last meal prior to blood collection: 04/07/2022 at 0730 am  Does the patient drink alcohol? Yes  Does the patient use tobacco products? No  Menopausal status? postmenopausal  Date of last menstrual cycle? 2008    Does the patient have a personal history of cancer? No   If yes, what type and dates of diagnosis/treatment: N/A    Does the patient have family history of cancer in immediate family (grandparents, parents, and/or siblings)? Yes   If yes, what was the relationship, cancer type, date of diagnosis, and outcome for each? Grandmother- cervical, Mother- basal cell skin cancer  Has the patient received a COVID-19 vaccination? Yes   If yes, vaccine manufacturer? Pfizer  Number of doses received? 3  Date of last dose? 09/28/2020    Has the patient ever tested positive for COVID? Yes   If yes, date of last  positive COVID test? 03/08/2022  Date of last COVID test taken? 03/08/2022  Has the patient received any other vaccines in the past year? Yes   If yes, which vaccines and dates? Flu vaccine 02/21/2022, Pneumovax PPSV-23 07/28/2021  The patient's medication list was reviewed with the patient and it was not correct and start dates were verified. Has the patient stopped any medications in the past 30 days? Yes  Patient is stopping her daily probiotic capsules currently. Her medication list will be updated for the correct dosages of what she is taking today.   Sharon Brookbank, RN 04/07/22 9:54 AM         

## 2022-04-08 ENCOUNTER — Encounter: Payer: Self-pay | Admitting: Surgery

## 2022-04-08 ENCOUNTER — Ambulatory Visit: Payer: Medicare PPO | Admitting: Surgery

## 2022-04-08 VITALS — BP 136/77 | HR 94 | Temp 98.4°F | Ht 66.5 in | Wt 218.0 lb

## 2022-04-08 DIAGNOSIS — D0511 Intraductal carcinoma in situ of right breast: Secondary | ICD-10-CM

## 2022-04-08 DIAGNOSIS — D0510 Intraductal carcinoma in situ of unspecified breast: Secondary | ICD-10-CM

## 2022-04-08 NOTE — Patient Instructions (Addendum)
Our surgery scheduler Pamala Hurry will call you within 24-48 hours to get you scheduled. If you have not heard from her after 48 hours, please call our office. Have the blue sheet available when she calls to write down important information.   A referral has been placed with Radiation/Oncology. They will call you for an appointment.    If you have any concerns or questions, please feel free to call our office.  Lumpectomy   A lumpectomy, sometimes called a partial mastectomy, is surgery to remove a cancerous tumor or mass (the lump) from a breast. It is a form of breast-conserving or breast-preservation surgery. This means that the cancerous tissue is removed but the breast remains intact. During a lumpectomy, the portion of the breast that contains the tumor is removed. Lymph nodes under your arm may also be removed. Lymph nodes are part of the body's disease-fighting system (immune system) and are usually the first place where breast cancer spreads. Tell a health care provider about: Any allergies you have. All medicines you are taking, including vitamins, herbs, eye drops, creams, and over-the-counter medicines. Any problems you or family members have had with anesthetic medicines. Any bleeding problems you have. Any surgeries you have had. Any medical conditions you have. Whether you are pregnant or may be pregnant. What are the risks? Generally, this is a safe procedure. However, problems may occur, including: Bleeding. Infection. Allergic reaction to medicines. Pain, swelling, weakness, or numbness in the arm on the side of your surgery. Temporary swelling. Change in the shape of the breast, especially if a large portion is removed. Scar tissue that forms at the surgical site and feels hard to the touch. Blood clots. What happens before the procedure? When to stop eating and drinking Follow instructions from your health care provider about what you may eat and drink. These may  include: 8 hours before your procedure Stop eating most foods. Do not eat meat, fried foods, or fatty foods. Eat only light foods, such as toast or crackers. All liquids are okay except energy drinks and alcohol. 6 hours before your procedure Stop eating. Drink only clear liquids, such as water, clear fruit juice, black coffee, plain tea, and sports drinks. Do not drink energy drinks or alcohol. 2 hours before your procedure Stop drinking all liquids. You may be allowed to take medicines with small sips of water. If you do not follow your health care provider's instructions, your procedure may be delayed or canceled. Medicines Ask your health care provider about: Changing or stopping your regular medicines. These include any diabetes medicines or blood thinners you take. Taking medicines such as aspirin and ibuprofen. These medicines can thin your blood. Do not take them unless your health care provider tells you to. Taking over-the-counter medicines, vitamins, herbs, and supplements. Surgery safety Ask your health care provider how your surgery site will be marked. A procedure may be done to locate and mark the tumor area in the breast (localization). This will guide the surgeon to where the incision will be made. This may be done with: Imaging, such as a mammogram, ultrasound, or MRI. Insertion of a small wire, clip, or seed, or an implant that will reflect a radar signal. Also, ask what steps will be taken to help prevent infection. These may include: Washing skin with a germ-killing soap. Taking antibiotic medicine. General instructions You may have screening tests or exams to get normal measurements of your arm, also called baseline measurements. These can be compared to measurements  done after surgery to monitor for swelling (lymphedema) that can develop after having lymph nodes removed. If you will be going home right after the procedure, plan to have a responsible adult: Take you  home from the hospital or clinic. You will not be allowed to drive. Care for you for the time you are told. What happens during the procedure?  An IV will be inserted into one of your veins. You may be given: A sedative. This helps you relax. Anesthesia. This will: Numb certain areas of your body. Make you fall asleep for surgery. An electric scalpel will be used to reduce bleeding (electrocautery knife). A curved incision that follows the natural curve of your breast will be made. The tumor will be removed along with some of the tissue around it. This will be sent to the lab for testing. Your health care provider may also remove lymph nodes at this time if needed. If the tumor is close to the muscles over your chest, some muscle tissue may also be removed. A small drain tube may be inserted into your breast area or armpit to collect fluid that may build up after surgery. This tube will be connected to a suction bulb on the outside of your body to remove the fluid. The incision will be closed with stitches (sutures). A bandage (dressing) may be placed over the incision. The procedure may vary among health care providers and hospitals. What happens after the procedure? Your blood pressure, heart rate, breathing rate, and blood oxygen level will be monitored until you leave the hospital or clinic. You will be given medicine for pain as needed. You will be encouraged to get up and walk as soon as you can. This will improve blood flow and breathing. Ask for help if you feel weak or unsteady. You may have a drain tube in place for 2-3 days to prevent a collection of blood (hematoma) from developing in the breast. You may have a pressure bandage applied for 1-2 days to prevent bleeding or swelling. Ask your health care provider how to care for your bandage at home. You may be given a tight sleeve to wear over your arm on the side of your surgery. Wear the sleeve as told by your health care  provider. Do not drive or operate machinery until your health care provider says that it is safe. Where to find more information American Cancer Society: cancer.[place]: cancer.gov Summary A lumpectomy, sometimes called a partial mastectomy, is surgery to remove a cancerous tumor or mass (the lump) from a breast. During a lumpectomy, the portion of the breast that contains the tumor is removed. Plan to have someone take you home from the hospital or clinic. You may have a drain tube in place for 2-3 days to prevent a collection of blood (hematoma) from developing in the breast. This information is not intended to replace advice given to you by your health care provider. Make sure you discuss any questions you have with your health care provider. Document Revised: 09/02/2021 Document Reviewed: 08/18/2021 Elsevier Patient Education  [place].

## 2022-04-09 ENCOUNTER — Other Ambulatory Visit: Payer: Self-pay | Admitting: Surgery

## 2022-04-09 ENCOUNTER — Other Ambulatory Visit: Payer: Self-pay | Admitting: *Deleted

## 2022-04-09 DIAGNOSIS — D0511 Intraductal carcinoma in situ of right breast: Secondary | ICD-10-CM

## 2022-04-10 ENCOUNTER — Ambulatory Visit: Payer: Self-pay | Admitting: Surgery

## 2022-04-10 DIAGNOSIS — D0511 Intraductal carcinoma in situ of right breast: Secondary | ICD-10-CM

## 2022-04-11 ENCOUNTER — Telehealth: Payer: Self-pay | Admitting: Surgery

## 2022-04-11 NOTE — Telephone Encounter (Signed)
Patient has been advised of Pre-Admission date/time, and Surgery date at [facility]  Surgery Date: 05/07/22 Preadmission Testing Date: 04/30/22 (phone 1p-5p)  Patient has been made aware to call [phone], between 1-3:00pm the day before surgery, to find out what time to arrive for surgery.    Also patient reminded of her breast tag to be placed at the [facility] on 04/23/22

## 2022-04-17 ENCOUNTER — Ambulatory Visit
Admission: RE | Admit: 2022-04-17 | Discharge: 2022-04-17 | Disposition: A | Discharge status: Home / Self Care | Payer: Medicare PPO | Source: Ambulatory Visit | Attending: Radiation Oncology | Admitting: Radiation Oncology

## 2022-04-17 ENCOUNTER — Encounter: Payer: Self-pay | Admitting: Radiation Oncology

## 2022-04-17 VITALS — BP 127/79 | HR 79 | Temp 98.0°F | Resp 16 | Ht 66.5 in | Wt 220.0 lb

## 2022-04-17 DIAGNOSIS — D509 Iron deficiency anemia, unspecified: Secondary | ICD-10-CM | POA: Insufficient documentation

## 2022-04-17 DIAGNOSIS — R011 Cardiac murmur, unspecified: Secondary | ICD-10-CM | POA: Insufficient documentation

## 2022-04-17 DIAGNOSIS — E785 Hyperlipidemia, unspecified: Secondary | ICD-10-CM | POA: Diagnosis not present

## 2022-04-17 DIAGNOSIS — Z79899 Other long term (current) drug therapy: Secondary | ICD-10-CM | POA: Insufficient documentation

## 2022-04-17 DIAGNOSIS — Z8616 Personal history of COVID-19: Secondary | ICD-10-CM | POA: Diagnosis not present

## 2022-04-17 DIAGNOSIS — Z86010 Personal history of colonic polyps: Secondary | ICD-10-CM | POA: Insufficient documentation

## 2022-04-17 DIAGNOSIS — D0511 Intraductal carcinoma in situ of right breast: Secondary | ICD-10-CM | POA: Insufficient documentation

## 2022-04-17 DIAGNOSIS — Z170 Estrogen receptor positive status [ER+]: Secondary | ICD-10-CM | POA: Diagnosis not present

## 2022-04-17 NOTE — Consult Note (Signed)
NEW PATIENT EVALUATION  Name: Michele Cruz  MRN: [id number]  Date:   04/17/2022     DOB: [birth year]   This 66 y.o. 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  REFERRING PHYSICIAN: Tower, Wynelle Fanny, MD  CHIEF COMPLAINT: No chief complaint on file.   DIAGNOSIS: The encounter diagnosis was Ductal carcinoma in situ (DCIS) of right breast.   PREVIOUS INVESTIGATIONS:  Mammogram and ultrasound reviewed Clinical notes reviewed Initial pathology reviewed  HPI: Patient is a 66 year old female who presented with an abnormal mammogram of the right breast showing round and punctate calcifications in the lateral central right breast spanning up to 6 mm.  She underwent stereotactic guided biopsy which was positive for ductal carcinoma in situ.  Tumor was high nuclear grade with focal necrosis and extensive cancerization of lobules.  Negative for invasive carcinoma.  Tumor was ER/PR positive.  She is scheduled for wide local excision and sentinel node biopsy is seen preoperatively today for opinion.  She is doing well and is asymptomatic.  PLANNED TREATMENT REGIMEN: Hypofractionated right whole breast radiation depending on final pathology results  PAST MEDICAL HISTORY:  has a past medical history of Allergic rhinitis, Anemia, iron deficiency, Anxiety, Arthritis, Asthma, Colon polyps, COVID-19 (07/2020), Family history of adverse reaction to anesthesia, Heart murmur, Heavy menses, Hemorrhoids, Hidradenitis suppurativa, History of exercise stress test (06/1999), History of MRI (2003), History of MRI (2007), History of MRI of spine (03/2004), Hyperlipidemia, Rosacea, Spondylosis, Wisdom teeth removed, and Zoster.    PAST SURGICAL HISTORY:  Past Surgical History:  Procedure Laterality Date   BREAST BIOPSY Right 03/28/2022   breast center in [place]   BREAST CYST ASPIRATION   03/2002   BREAST CYST ASPIRATION  03/2004   left   CATARACT EXTRACTION     CESAREAN SECTION     COLONOSCOPY  01/26/2012   HEMORRHOID BANDING  08/2021   KNEE ARTHROPLASTY Left 03/15/2018   Procedure: COMPUTER ASSISTED TOTAL KNEE ARTHROPLASTY;  Surgeon: Dereck Leep, MD;  Location: [facility] ORS;  Service: Orthopedics;  Laterality: Left;   KNEE ARTHROPLASTY Right 07/28/2018   Procedure: COMPUTER ASSISTED TOTAL KNEE ARTHROPLASTY;  Surgeon: Dereck Leep, MD;  Location: [facility] ORS;  Service: Orthopedics;  Laterality: Right;   KNEE SURGERY     left    FAMILY HISTORY: family history includes Alcohol abuse in her father; Arthritis in her mother; Colon polyps in her mother; Diabetes in her father and another family member; Diverticulitis in her mother, sister, and sister; Heart disease in her father; Hyperlipidemia in her sister; Obesity in her mother.  SOCIAL HISTORY:  reports that she has never smoked. She has never used smokeless tobacco. She reports current alcohol use of about 4.0 standard drinks of alcohol per week. She reports that she does not use drugs.  ALLERGIES: Decadron [dexamethasone], Crestor [rosuvastatin], Depo-medrol [methylprednisolone acetate], Lipitor [atorvastatin calcium], Meloxicam, and Simvastatin  MEDICATIONS:  Current Outpatient Medications  Medication Sig Dispense Refill   Ascorbic Acid (VITAMIN C) 100 MG tablet Take 5 tablets (500 mg total) by mouth daily.     co-enzyme Q-10 30 MG capsule Take 100 mg by mouth 1 day or 1 dose.     COLLAGEN PO Take by mouth daily. BioTRUST Multi-collagen, 10 g bioactive daily     fexofenadine (ALLEGRA) 180 MG tablet Take 180 mg by mouth daily.     Fluocinolone Acetonide  0.01 % OIL Place 1 drop into both ears as needed (Not taking anymore). Not taking anymore. 30 mL 0   hydrocortisone (ANUSOL-HC) 25 MG suppository Place 1 suppository (25 mg total) rectally 2 (two) times daily as needed for hemorrhoids or anal itching (only taking as  needed.). 7 suppository 3   Magnesium Oxide 250 MG TABS Take 500 mg by mouth daily.       Misc Natural Products (TURMERIC CURCUMIN) CAPS Take 1 capsule by mouth daily. Theracurmin     Multiple Vitamin (MULTIVITAMIN) tablet Take 1 tablet by mouth daily.     Omega-3 Fatty Acids (FISH OIL) 1200 MG CAPS Take 2,000 mg by mouth daily.     No current facility-administered medications for this encounter.    ECOG PERFORMANCE STATUS:  0 - Asymptomatic  REVIEW OF SYSTEMS: Patient denies any weight loss, fatigue, weakness, fever, chills or night sweats. Patient denies any loss of vision, blurred vision. Patient denies any ringing  of the ears or hearing loss. No irregular heartbeat. Patient denies heart murmur or history of fainting. Patient denies any chest pain or pain radiating to her upper extremities. Patient denies any shortness of breath, difficulty breathing at night, cough or hemoptysis. Patient denies any swelling in the lower legs. Patient denies any nausea vomiting, vomiting of blood, or coffee ground material in the vomitus. Patient denies any stomach pain. Patient states has had normal bowel movements no significant constipation or diarrhea. Patient denies any dysuria, hematuria or significant nocturia. Patient denies any problems walking, swelling in the joints or loss of balance. Patient denies any skin changes, loss of hair or loss of weight. Patient denies any excessive worrying or anxiety or significant depression. Patient denies any problems with insomnia. Patient denies excessive thirst, polyuria, polydipsia. Patient denies any swollen glands, patient denies easy bruising or easy bleeding. Patient denies any recent infections, allergies or URI. Patient "s visual fields have not changed significantly in recent time.   PHYSICAL EXAM: BP 127/79 (BP Location: Left Arm, Patient Position: Sitting, Cuff Size: Normal)   Pulse 79   Temp 98 F (36.7 C) (Tympanic)   Resp 16   Ht 5' 6.5" (1.689 m)  Comment: Stated Ht.  Wt 220 lb (99.8 kg)   BMI 34.98 kg/m  Breast exam shows no dominant mass or nodularity in either breast.  No axillary or supraclavicular adenopathy is appreciated.  Well-developed well-nourished patient in NAD. HEENT reveals PERLA, EOMI, discs not visualized.  Oral cavity is clear. No oral mucosal lesions are identified. Neck is clear without evidence of cervical or supraclavicular adenopathy. Lungs are clear to A&P. Cardiac examination is essentially unremarkable with regular rate and rhythm without murmur rub or thrill. Abdomen is benign with no organomegaly or masses noted. Motor sensory and DTR levels are equal and symmetric in the upper and lower extremities. Cranial nerves II through XII are grossly intact. Proprioception is intact. No peripheral adenopathy or edema is identified. No motor or sensory levels are noted. Crude visual fields are within normal range.  LABORATORY DATA: Pathology reports reviewed    RADIOLOGY RESULTS: Mammogram and ultrasound reviewed compatible with above-stated findings   IMPRESSION: Ductal carcinoma in situ of the right breast prior to lumpectomy in 66 year old female tumor is ER/PR positive  PLAN: At this time we will see the patient back approximately 2 weeks after her wide local excision for reevaluation and treatment planning of gone over risks and benefits of radiation therapy including skin reaction fatigue alteration of blood counts  possible inclusion of superficial lung.  We will make final determination on treatment after review of final pathology.  I anticipate approximate 4-week course of radiation.  Patient also will benefit from endocrine therapy after completion of radiation.  Patient comprehends my recommendations well.  I would like to take this opportunity to thank you for allowing me to participate in the care of your patient.Noreene Filbert, MD

## 2022-04-23 ENCOUNTER — Ambulatory Visit
Admission: RE | Admit: 2022-04-23 | Discharge: 2022-04-23 | Disposition: A | Discharge status: Home / Self Care | Payer: Medicare PPO | Source: Ambulatory Visit | Attending: Surgery | Admitting: Surgery

## 2022-04-23 DIAGNOSIS — D0511 Intraductal carcinoma in situ of right breast: Secondary | ICD-10-CM | POA: Insufficient documentation

## 2022-04-30 ENCOUNTER — Encounter
Admission: RE | Admit: 2022-04-30 | Discharge: 2022-04-30 | Disposition: A | Discharge status: Home / Self Care | Payer: Medicare PPO | Source: Ambulatory Visit | Attending: Surgery | Admitting: Surgery

## 2022-04-30 DIAGNOSIS — Z01810 Encounter for preprocedural cardiovascular examination: Secondary | ICD-10-CM

## 2022-04-30 DIAGNOSIS — R011 Cardiac murmur, unspecified: Secondary | ICD-10-CM

## 2022-04-30 HISTORY — DX: Intraductal carcinoma in situ of unspecified breast: D05.10

## 2022-04-30 NOTE — Patient Instructions (Signed)
Your procedure is scheduled on:05-07-22 Wednesday Report to the Registration Desk on the 1st floor of the [facility].Then proceed to the 2nd floor Surgery Desk To find out your arrival time, please call [phone] between 1PM - 3PM on:05-06-22 Tuesday If your arrival time is 6:00 am, do not arrive prior to that time as the [facility] entrance doors do not open until 6:00 am.  REMEMBER: Instructions that are not followed completely may result in serious medical risk, up to and including death; or upon the discretion of your surgeon and anesthesiologist your surgery may need to be rescheduled.  Do not eat food OR drink any liquids after midnight the night before surgery.  No gum chewing, lozengers or hard candies.  TAKE THESE MEDICATIONS THE MORNING OF SURGERY WITH A SIP OF WATER: -fexofenadine (ALLEGRA)   One week prior to surgery: Stop Anti-inflammatories (NSAIDS) such as Advil, Aleve, Ibuprofen, Motrin, Naproxen, Naprosyn and Aspirin based products such as Excedrin, Goodys Powder, BC Powder.You may however, continue to take Tylenol if needed for pain up until the day of surgery.  Stop ANY OVER THE COUNTER supplements/vitamins NOW (04-30-22) until after surgery (Vitamin C, C, CO Q 10, Collagen, Magnesium, Turmeric (theracurmin), Multivitamin and Fish Oil)  No Alcohol for 24 hours before or after surgery.  No Smoking including e-cigarettes for 24 hours prior to surgery.  No chewable tobacco products for at least 6 hours prior to surgery.  No nicotine patches on the day of surgery.  Do not use any "recreational" drugs for at least a week prior to your surgery.  Please be advised that the combination of cocaine and anesthesia may have negative outcomes, up to and including death. If you test positive for cocaine, your surgery will be cancelled.  On the morning of surgery brush your teeth with toothpaste and water, you may rinse your mouth with mouthwash if you wish. Do not swallow  any toothpaste or mouthwash.  Use CHG Soap as directed on instruction sheet.  Do not wear jewelry, make-up, hairpins, clips or nail polish.  Do not wear lotions, powders, or perfumes.   Do not shave body from the neck down 48 hours prior to surgery just in case you cut yourself which could leave a site for infection.  Also, freshly shaved skin may become irritated if using the CHG soap.  Contact lenses, hearing aids and dentures may not be worn into surgery.  Do not bring valuables to the hospital. [facility] is not responsible for any missing/lost belongings or valuables.   Notify your doctor if there is any change in your medical condition (cold, fever, infection).  Wear comfortable clothing (specific to your surgery type) to the hospital.  After surgery, you can help prevent lung complications by doing breathing exercises.  Take deep breaths and cough every 1-2 hours. Your doctor may order a device called an Incentive Spirometer to help you take deep breaths. When coughing or sneezing, hold a pillow firmly against your incision with both hands. This is called "splinting." Doing this helps protect your incision. It also decreases belly discomfort.  If you are being admitted to the hospital overnight, leave your suitcase in the car. After surgery it may be brought to your room.  If you are being discharged the day of surgery, you will not be allowed to drive home. You will need a responsible adult (18 years or older) to drive you home and stay with you that night.   If you are taking public  transportation, you will need to have a responsible adult (18 years or older) with you. Please confirm with your physician that it is acceptable to use public transportation.   Please call the [facility]. at [phone] if you have any questions about these instructions.  Surgery Visitation Policy:  Patients undergoing a surgery or procedure may have two family members or  support persons with them as long as the person is not COVID-19 positive or experiencing its symptoms.

## 2022-05-01 ENCOUNTER — Encounter
Admission: RE | Admit: 2022-05-01 | Discharge: 2022-05-01 | Disposition: A | Discharge status: Home / Self Care | Payer: Medicare PPO | Source: Ambulatory Visit | Attending: Surgery | Admitting: Surgery

## 2022-05-01 DIAGNOSIS — Z01810 Encounter for preprocedural cardiovascular examination: Secondary | ICD-10-CM | POA: Insufficient documentation

## 2022-05-01 DIAGNOSIS — R011 Cardiac murmur, unspecified: Secondary | ICD-10-CM | POA: Insufficient documentation

## 2022-05-07 ENCOUNTER — Encounter: Payer: Self-pay | Admitting: Surgery

## 2022-05-07 ENCOUNTER — Other Ambulatory Visit: Payer: Self-pay

## 2022-05-07 ENCOUNTER — Ambulatory Visit: Payer: Medicare PPO | Admitting: Anesthesiology

## 2022-05-07 ENCOUNTER — Ambulatory Visit
Admission: RE | Admit: 2022-05-07 | Discharge: 2022-05-07 | Disposition: A | Discharge status: Home / Self Care | Payer: Medicare PPO | Attending: Surgery | Admitting: Surgery

## 2022-05-07 ENCOUNTER — Encounter
Admission: RE | Disposition: A | Discharge status: Home / Self Care | Payer: Self-pay | Source: Home / Self Care | Attending: Surgery

## 2022-05-07 ENCOUNTER — Ambulatory Visit
Admission: RE | Admit: 2022-05-07 | Discharge: 2022-05-07 | Disposition: A | Discharge status: Home / Self Care | Payer: Medicare PPO | Source: Ambulatory Visit | Attending: Surgery | Admitting: Surgery

## 2022-05-07 DIAGNOSIS — D0511 Intraductal carcinoma in situ of right breast: Secondary | ICD-10-CM

## 2022-05-07 DIAGNOSIS — Z86010 Personal history of colonic polyps: Secondary | ICD-10-CM | POA: Insufficient documentation

## 2022-05-07 DIAGNOSIS — E785 Hyperlipidemia, unspecified: Secondary | ICD-10-CM | POA: Insufficient documentation

## 2022-05-07 DIAGNOSIS — Z8719 Personal history of other diseases of the digestive system: Secondary | ICD-10-CM | POA: Diagnosis not present

## 2022-05-07 HISTORY — PX: BREAST LUMPECTOMY WITH RADIOFREQUENCY TAG IDENTIFICATION: SHX6884

## 2022-05-07 SURGERY — BREAST LUMPECTOMY WITH RADIOFREQUENCY TAG IDENTIFICATION
Anesthesia: General | Laterality: Right

## 2022-05-07 MED ORDER — DEXAMETHASONE SODIUM PHOSPHATE 10 MG/ML IJ SOLN
INTRAMUSCULAR | Status: AC
  Filled 2022-05-07: qty 1

## 2022-05-07 MED ORDER — PROPOFOL 10 MG/ML IV BOLUS
INTRAVENOUS | Status: AC
  Filled 2022-05-07: qty 20

## 2022-05-07 MED ORDER — DEXAMETHASONE SODIUM PHOSPHATE 10 MG/ML IJ SOLN
INTRAMUSCULAR | Status: DC | PRN
  Administered 2022-05-07: 5 mg via INTRAVENOUS

## 2022-05-07 MED ORDER — LIDOCAINE HCL (CARDIAC) PF 100 MG/5ML IV SOSY
PREFILLED_SYRINGE | INTRAVENOUS | Status: DC | PRN
  Administered 2022-05-07: 80 mg via INTRAVENOUS

## 2022-05-07 MED ORDER — SEVOFLURANE IN SOLN
RESPIRATORY_TRACT | Status: AC
  Filled 2022-05-07: qty 250

## 2022-05-07 MED ORDER — GABAPENTIN 300 MG PO CAPS: 300.0000 mg | ORAL_CAPSULE | ORAL | Status: AC

## 2022-05-07 MED ORDER — ONDANSETRON HCL 4 MG/2ML IJ SOLN: 4.0000 mg | Freq: Once | INTRAMUSCULAR | Status: DC | PRN

## 2022-05-07 MED ORDER — OXYCODONE HCL 5 MG/5ML PO SOLN: 5.0000 mg | Freq: Once | ORAL | Status: DC | PRN

## 2022-05-07 MED ORDER — OXYCODONE HCL 5 MG PO TABS: 5.0000 mg | ORAL_TABLET | Freq: Once | ORAL | Status: DC | PRN

## 2022-05-07 MED ORDER — ONDANSETRON HCL 4 MG/2ML IJ SOLN
INTRAMUSCULAR | Status: DC | PRN
  Administered 2022-05-07: 4 mg via INTRAVENOUS

## 2022-05-07 MED ORDER — BUPIVACAINE-EPINEPHRINE (PF) 0.25% -1:200000 IJ SOLN
INTRAMUSCULAR | Status: AC
  Filled 2022-05-07: qty 30

## 2022-05-07 MED ORDER — KETOROLAC TROMETHAMINE 30 MG/ML IJ SOLN
INTRAMUSCULAR | Status: DC | PRN
  Administered 2022-05-07: 30 mg via INTRAVENOUS

## 2022-05-07 MED ORDER — BUPIVACAINE LIPOSOME 1.3 % IJ SUSP
INTRAMUSCULAR | Status: AC
  Filled 2022-05-07: qty 20

## 2022-05-07 MED ORDER — FAMOTIDINE 20 MG PO TABS
ORAL_TABLET | ORAL | Status: AC
  Administered 2022-05-07: 20 mg via ORAL
  Filled 2022-05-07: qty 1

## 2022-05-07 MED ORDER — GLYCOPYRROLATE 0.2 MG/ML IJ SOLN
INTRAMUSCULAR | Status: AC
  Filled 2022-05-07: qty 1

## 2022-05-07 MED ORDER — FAMOTIDINE 20 MG PO TABS: 20.0000 mg | ORAL_TABLET | Freq: Once | ORAL | Status: AC

## 2022-05-07 MED ORDER — LIDOCAINE HCL (PF) 2 % IJ SOLN
INTRAMUSCULAR | Status: AC
  Filled 2022-05-07: qty 5

## 2022-05-07 MED ORDER — FENTANYL CITRATE (PF) 100 MCG/2ML IJ SOLN
INTRAMUSCULAR | Status: AC
  Filled 2022-05-07: qty 2

## 2022-05-07 MED ORDER — STERILE WATER FOR IRRIGATION IR SOLN
Status: DC | PRN
  Administered 2022-05-07: 200 mL

## 2022-05-07 MED ORDER — CEFAZOLIN SODIUM-DEXTROSE 2-4 GM/100ML-% IV SOLN
INTRAVENOUS | Status: AC
  Filled 2022-05-07: qty 100

## 2022-05-07 MED ORDER — CHLORHEXIDINE GLUCONATE 0.12 % MT SOLN
OROMUCOSAL | Status: AC
  Administered 2022-05-07: 15 mL via OROMUCOSAL
  Filled 2022-05-07: qty 15

## 2022-05-07 MED ORDER — BUPIVACAINE-EPINEPHRINE (PF) 0.25% -1:200000 IJ SOLN
INTRAMUSCULAR | Status: DC | PRN
  Administered 2022-05-07: 10 mL

## 2022-05-07 MED ORDER — CEFAZOLIN SODIUM-DEXTROSE 2-4 GM/100ML-% IV SOLN
2.0000 g | INTRAVENOUS | Status: AC
  Administered 2022-05-07: 2 g via INTRAVENOUS

## 2022-05-07 MED ORDER — DEXMEDETOMIDINE HCL IN NACL 200 MCG/50ML IV SOLN
INTRAVENOUS | Status: DC | PRN
  Administered 2022-05-07: 8 ug via INTRAVENOUS
  Administered 2022-05-07: 4 ug via INTRAVENOUS
  Administered 2022-05-07: 8 ug via INTRAVENOUS

## 2022-05-07 MED ORDER — CHLORHEXIDINE GLUCONATE 0.12 % MT SOLN: 15.0000 mL | Freq: Once | OROMUCOSAL | Status: AC

## 2022-05-07 MED ORDER — FENTANYL CITRATE (PF) 100 MCG/2ML IJ SOLN: 25.0000 ug | INTRAMUSCULAR | Status: DC | PRN

## 2022-05-07 MED ORDER — BUPIVACAINE LIPOSOME 1.3 % IJ SUSP: 20.0000 mL | Freq: Once | INTRAMUSCULAR | Status: DC

## 2022-05-07 MED ORDER — ONDANSETRON HCL 4 MG/2ML IJ SOLN
INTRAMUSCULAR | Status: AC
  Filled 2022-05-07: qty 2

## 2022-05-07 MED ORDER — FENTANYL CITRATE (PF) 100 MCG/2ML IJ SOLN
INTRAMUSCULAR | Status: DC | PRN
  Administered 2022-05-07 (×2): 25 ug via INTRAVENOUS
  Administered 2022-05-07: 50 ug via INTRAVENOUS

## 2022-05-07 MED ORDER — LACTATED RINGERS IV SOLN: INTRAVENOUS | Status: DC

## 2022-05-07 MED ORDER — ACETAMINOPHEN 500 MG PO TABS: 1000.0000 mg | ORAL_TABLET | ORAL | Status: AC

## 2022-05-07 MED ORDER — HYDROCODONE-ACETAMINOPHEN 5-325 MG PO TABS: 1.0000 | ORAL_TABLET | Freq: Four times a day (QID) | ORAL | 0 refills | Status: DC | PRN

## 2022-05-07 MED ORDER — ORAL CARE MOUTH RINSE: 15.0000 mL | Freq: Once | OROMUCOSAL | Status: AC

## 2022-05-07 MED ORDER — ISOSULFAN BLUE 1 % ~~LOC~~ SOLN
SUBCUTANEOUS | Status: AC
  Filled 2022-05-07: qty 5

## 2022-05-07 MED ORDER — GABAPENTIN 300 MG PO CAPS
ORAL_CAPSULE | ORAL | Status: AC
  Administered 2022-05-07: 300 mg via ORAL
  Filled 2022-05-07: qty 1

## 2022-05-07 MED ORDER — DEXMEDETOMIDINE HCL IN NACL 80 MCG/20ML IV SOLN
INTRAVENOUS | Status: AC
  Filled 2022-05-07: qty 20

## 2022-05-07 MED ORDER — CHLORHEXIDINE GLUCONATE CLOTH 2 % EX PADS: 6.0000 | MEDICATED_PAD | Freq: Once | CUTANEOUS | Status: DC

## 2022-05-07 MED ORDER — GLYCOPYRROLATE 0.2 MG/ML IJ SOLN
INTRAMUSCULAR | Status: DC | PRN
  Administered 2022-05-07: .2 mg via INTRAVENOUS

## 2022-05-07 MED ORDER — ACETAMINOPHEN 500 MG PO TABS
ORAL_TABLET | ORAL | Status: AC
  Administered 2022-05-07: 1000 mg via ORAL
  Filled 2022-05-07: qty 2

## 2022-05-07 MED ORDER — KETOROLAC TROMETHAMINE 30 MG/ML IJ SOLN
INTRAMUSCULAR | Status: AC
  Filled 2022-05-07: qty 1

## 2022-05-07 MED ORDER — PROPOFOL 10 MG/ML IV BOLUS
INTRAVENOUS | Status: DC | PRN
  Administered 2022-05-07: 50 mg via INTRAVENOUS
  Administered 2022-05-07: 150 mg via INTRAVENOUS

## 2022-05-07 SURGICAL SUPPLY — 45 items
ADH SKN CLS APL DERMABOND .7 (GAUZE/BANDAGES/DRESSINGS) ×1
APL PRP STRL LF DISP 70% ISPRP (MISCELLANEOUS) ×1
APPLIER CLIP 9.375 SM OPEN (CLIP) ×1
APR CLP SM 9.3 20 MLT OPN (CLIP) ×1
BLADE SURG 15 STRL LF DISP TIS (BLADE) ×1 IMPLANT
BLADE SURG 15 STRL SS (BLADE) ×1
BRA SURGICAL XLRG (MISCELLANEOUS) IMPLANT
CHLORAPREP W/TINT 26 (MISCELLANEOUS) ×1 IMPLANT
CLIP APPLIE 9.375 SM OPEN (CLIP) IMPLANT
CNTNR SPEC 2.5X3XGRAD LEK (MISCELLANEOUS)
CONT SPEC 4OZ STER OR WHT (MISCELLANEOUS)
CONT SPEC 4OZ STRL OR WHT (MISCELLANEOUS)
CONTAINER SPEC 2.5X3XGRAD LEK (MISCELLANEOUS) IMPLANT
COVER PROBE GAMMA FINDER SLV (MISCELLANEOUS) ×1 IMPLANT
DERMABOND ADVANCED .7 DNX12 (GAUZE/BANDAGES/DRESSINGS) ×1 IMPLANT
DEVICE DUBIN SPECIMEN MAMMOGRA (MISCELLANEOUS) ×1 IMPLANT
DRAPE LAPAROTOMY TRNSV 106X77 (MISCELLANEOUS) ×1 IMPLANT
ELECT CAUTERY BLADE TIP 2.5 (TIP) ×1
ELECT REM PT RETURN 9FT ADLT (ELECTROSURGICAL) ×1
ELECTRODE CAUTERY BLDE TIP 2.5 (TIP) ×1 IMPLANT
ELECTRODE REM PT RTRN 9FT ADLT (ELECTROSURGICAL) ×1 IMPLANT
GAUZE 4X4 16PLY ~~LOC~~+RFID DBL (SPONGE) ×1 IMPLANT
GLOVE ORTHO TXT STRL SZ7.5 (GLOVE) ×1 IMPLANT
GOWN STRL REUS W/ TWL LRG LVL3 (GOWN DISPOSABLE) ×1 IMPLANT
GOWN STRL REUS W/ TWL XL LVL3 (GOWN DISPOSABLE) ×1 IMPLANT
GOWN STRL REUS W/TWL LRG LVL3 (GOWN DISPOSABLE) ×1
GOWN STRL REUS W/TWL XL LVL3 (GOWN DISPOSABLE) ×1
KIT MARKER MARGIN INK (KITS) IMPLANT
KIT TURNOVER KIT A (KITS) ×1 IMPLANT
MANIFOLD NEPTUNE II (INSTRUMENTS) ×1 IMPLANT
NEEDLE HYPO 22GX1.5 SAFETY (NEEDLE) ×1 IMPLANT
PACK BASIN MINOR ARMC (MISCELLANEOUS) ×1 IMPLANT
SET LOCALIZER 20 PROBE US (MISCELLANEOUS) ×1 IMPLANT
SPIKE FLUID TRANSFER (MISCELLANEOUS) ×1 IMPLANT
SUT MNCRL 4-0 (SUTURE) ×1
SUT MNCRL 4-0 27XMFL (SUTURE) ×1
SUT VIC AB 3-0 SH 27 (SUTURE) ×1
SUT VIC AB 3-0 SH 27X BRD (SUTURE) ×1 IMPLANT
SUTURE MNCRL 4-0 27XMF (SUTURE) ×1 IMPLANT
SYR 10ML LL (SYRINGE) ×1 IMPLANT
SYR 20ML LL LF (SYRINGE) ×1 IMPLANT
TRAP FLUID SMOKE EVACUATOR (MISCELLANEOUS) ×1 IMPLANT
TRAP NEPTUNE SPECIMEN COLLECT (MISCELLANEOUS) ×1 IMPLANT
WATER STERILE IRR 1000ML POUR (IV SOLUTION) ×1 IMPLANT
WATER STERILE IRR 500ML POUR (IV SOLUTION) ×1 IMPLANT

## 2022-05-07 NOTE — Anesthesia Postprocedure Evaluation (Signed)
Anesthesia Post Note  Patient: Michele Cruz  Procedure(s) Performed: BREAST LUMPECTOMY WITH RADIOFREQUENCY TAG IDENTIFICATION (Right)  Anesthesia Type: General Anesthetic complications: no   No notable events documented.   Last Vitals:  Vitals:   05/07/22 0901 05/07/22 0913  BP: 123/68   Pulse: 93 81  Resp: 14 13  Temp: (!) 36.1 C   SpO2: 99% 97%    Last Pain:  Vitals:   05/07/22 0901  TempSrc:   PainSc: Asleep                 Lanora Manis

## 2022-05-07 NOTE — Transfer of Care (Addendum)
Immediate Anesthesia Transfer of Care Note  Patient: Michele Cruz  Procedure(s) Performed: BREAST LUMPECTOMY WITH RADIOFREQUENCY TAG IDENTIFICATION (Right)  Patient Location: PACU  Anesthesia Type:General  Level of Consciousness: drowsy  Airway & Oxygen Therapy: Patient Spontanous Breathing and Patient connected to face mask oxygen  Post-op Assessment: Report given to RN and Post -op Vital signs reviewed and stable  Post vital signs: Reviewed and stable  Last Vitals:  Vitals Value Taken Time  BP 123/68 05/07/22 0901  Temp 36.1 C 05/07/22 0901  Pulse 71 05/07/22 0914  Resp 14 05/07/22 0914  SpO2 98 % 05/07/22 0914  Vitals shown include unvalidated device data.  Last Pain:  Vitals:   05/07/22 0901  TempSrc:   PainSc: Asleep         Complications: No notable events documented.

## 2022-05-07 NOTE — Discharge Instructions (Signed)

## 2022-05-07 NOTE — Anesthesia Preprocedure Evaluation (Signed)
Anesthesia Evaluation  Patient identified by MRN, date of birth, ID band Patient awake    Reviewed: Allergy & Precautions, H&P , NPO status , Patient's Chart, lab work & pertinent test results, reviewed documented beta blocker date and time   History of Anesthesia Complications Negative for: history of anesthetic complications  Airway Mallampati: II   Neck ROM: full    Dental  (+) Teeth Intact, Caps, Dental Advidsory Given   Pulmonary neg shortness of breath, asthma (one episode) , neg sleep apnea, neg COPD, neg recent URI   Pulmonary exam normal        Cardiovascular Exercise Tolerance: Good (-) hypertension(-) angina (-) CAD, (-) Past MI and (-) Cardiac Stents Normal cardiovascular exam(-) dysrhythmias + Valvular Problems/Murmurs (as a kid)  Rhythm:regular Rate:Normal     Neuro/Psych neg Seizures PSYCHIATRIC DISORDERS Anxiety      Neuromuscular disease    GI/Hepatic negative GI ROS, Neg liver ROS,,,  Endo/Other  negative endocrine ROS    Renal/GU negative Renal ROS  negative genitourinary   Musculoskeletal   Abdominal   Peds  Hematology negative hematology ROS (+)   Anesthesia Other Findings Past Medical History: No date: Allergic rhinitis No date: Anemia, iron deficiency No date: Anxiety No date: Arthritis No date: Asthma     Comment:  no symptoms last 25 years No date: Colon polyps No date: Family history of adverse reaction to anesthesia     Comment:  sister PONV No date: Heavy menses No date: Hemorrhoids No date: Hidradenitis suppurativa 06/1999: History of exercise stress test     Comment:  neg 2003: History of MRI     Comment:  neg per patient 2007: History of MRI     Comment:  right quad fatty defect 03/2004: History of MRI of spine     Comment:  C-5, C5-6 disk protrusion No date: Hyperlipidemia     Comment:  LDL No date: Rosacea No date: Spondylosis     Comment:  deg lumbar disc dz No  date: Wisdom teeth removed No date: Zoster Past Surgical History: 03/2002: BREAST CYST ASPIRATION 03/2004: BREAST CYST ASPIRATION     Comment:  left No date: CESAREAN SECTION No date: COLONOSCOPY 03/15/2018: KNEE ARTHROPLASTY; Left     Comment:  Procedure: COMPUTER ASSISTED TOTAL KNEE ARTHROPLASTY;                Surgeon: Dereck Leep, MD;  Location: [facility] ORS;                Service: Orthopedics;  Laterality: Left; No date: KNEE SURGERY     Comment:  left BMI    Body Mass Index:  33.31 kg/m     Reproductive/Obstetrics negative OB ROS                             Anesthesia Physical Anesthesia Plan  ASA: 2  Anesthesia Plan: General   Post-op Pain Management:    Induction: Intravenous  PONV Risk Score and Plan: 4 or greater and Ondansetron, Treatment may vary due to age or medical condition and Midazolam  Airway Management Planned: LMA  Additional Equipment:   Intra-op Plan:   Post-operative Plan: Extubation in OR  Informed Consent: I have reviewed the patients History and Physical, chart, labs and discussed the procedure including the risks, benefits and alternatives for the proposed anesthesia with the patient or authorized representative who has indicated his/her understanding and acceptance.  Dental Advisory Given  Plan Discussed with: CRNA  Anesthesia Plan Comments: (Hx of LBP noted.  Pt amenable to SAB and R/B discussed with patient.  JA)        Anesthesia Quick Evaluation

## 2022-05-07 NOTE — Anesthesia Procedure Notes (Signed)
Procedure Name: LMA Insertion Date/Time: 05/07/2022 7:38 AM  Performed by: Jonna Clark, CRNAPre-anesthesia Checklist: Patient identified, Patient being monitored, Timeout performed, Emergency Drugs available and Suction available Patient Re-evaluated:Patient Re-evaluated prior to induction Oxygen Delivery Method: Circle system utilized Preoxygenation: Pre-oxygenation with 100% oxygen Induction Type: IV induction Ventilation: Mask ventilation without difficulty LMA: LMA with gastric port inserted LMA Size: 4.0 Tube type: Oral Number of attempts: 1 Placement Confirmation: positive ETCO2 and breath sounds checked- equal and bilateral Tube secured with: Tape Dental Injury: Teeth and Oropharynx as per pre-operative assessment

## 2022-05-07 NOTE — Interval H&P Note (Signed)
History and Physical Interval Note:  05/07/2022 7:22 AM  Michele Cruz  has presented today for surgery, with the diagnosis of Ductal carcinoma of right breast.  The various methods of treatment have been discussed with the patient and family. After consideration of risks, benefits and other options for treatment, the patient has consented to  Procedure(s): BREAST LUMPECTOMY WITH RADIOFREQUENCY TAG IDENTIFICATION (Right) as a surgical intervention.  The patient's history has been reviewed, patient examined, no change in status, stable for surgery.  I have reviewed the patient's chart and labs.  Questions were answered to the patient's satisfaction.    The right side is marked.  Ronny Bacon

## 2022-05-07 NOTE — Op Note (Signed)
  Pre-operative Diagnosis: Breast Cancer, DCIS, right breast    Post-operative Diagnosis: Same  Surgeon: Ronny Bacon, M.D., [facility]  Anesthesia: General  Procedure: Right lumpectomy, RFID tag directed.   Procedure Details  The patient was seen again in the Holding Room. The benefits, complications, treatment options, and expected outcomes were discussed with the patient. The risks of bleeding, infection, recurrence of symptoms, failure to resolve symptoms, hematoma, seroma, open wound, cosmetic deformity, and the need for further surgery were discussed.  The patient was taken to Operating Room, identified as Michele Cruz and the procedure verified.  A Time Out was held and the above information confirmed.  Prior to the induction of general anesthesia, antibiotic prophylaxis was administered. VTE prophylaxis was in place. The patient was positioned in the supine position. Appropriate anesthesia was then administered and tolerated well. The LOCALizer is used to mark the skin for incision.  The chest was prepped with Chloraprep and draped in the sterile fashion.   Attention was turned to the RFID tag localization site where an incision along the skin lines was made over the lateral right breast. Dissection using the LOCALizer to perform a lumpectomy with adequate margins was performed. This was done with electrocautery and sharp dissection with Mayo scissors.  There was remarkably dense and difficult tissue to divide with the Mayo scissors, a larger than anticipated specimen was obtained due to the density of the breast tissue.  There was minimal bleeding, and the cavity packed.  The specimen was taken to the back table and painted to demarcate the 6 surfaces of potential margin.   I returned to the cavity to remove the packing, and hemostasis was confirmed with electrocautery.   I then placed small clips at multiple points around the biopsy cavity. Once assuring that hemostasis was adequate and  checked multiple times the wound was closed with interrupted 3-0 Vicryl followed by 4-0 subcuticular Monocryl sutures.  Dermabond is utilized to seal the incision.  Local anesthesia was infiltrated into the skin prior to the incision, and into the cavity upon completion.  Findings: Faxitron imaging: Confirmed presence of the markers centrally within the specimen.  Estimated Blood Loss: Minimal         Drains: None         Specimens: Right lateral breast tissue       Complications: None         Condition: Stable   Ronny Bacon, M.D., [facility]  05/07/2022 ; 9:05 AM

## 2022-05-12 ENCOUNTER — Other Ambulatory Visit: Payer: Self-pay | Admitting: Anatomic Pathology & Clinical Pathology

## 2022-05-12 LAB — SURGICAL PATHOLOGY

## 2022-05-20 ENCOUNTER — Other Ambulatory Visit: Payer: Self-pay

## 2022-05-20 ENCOUNTER — Encounter: Payer: Self-pay | Admitting: Surgery

## 2022-05-20 ENCOUNTER — Ambulatory Visit (INDEPENDENT_AMBULATORY_CARE_PROVIDER_SITE_OTHER): Payer: Medicare PPO | Admitting: Surgery

## 2022-05-20 VITALS — BP 119/77 | HR 73 | Temp 99.0°F | Ht 66.5 in | Wt 218.0 lb

## 2022-05-20 DIAGNOSIS — Z09 Encounter for follow-up examination after completed treatment for conditions other than malignant neoplasm: Secondary | ICD-10-CM

## 2022-05-20 DIAGNOSIS — D0511 Intraductal carcinoma in situ of right breast: Secondary | ICD-10-CM

## 2022-05-20 NOTE — Progress Notes (Signed)
[facility] POST-OP OFFICE VISIT  05/20/2022  HPI: Michele Cruz is a 66 y.o. female 13 days s/p right breast excisional biopsy, RFID tag guided.  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  Pathology report after excision noted below. Patient denies any significant pain or issues with her right breast lumpectomy.  She is well aware she has an appoint with Dr. Baruch Gouty soon.  Vital signs: BP 119/77   Pulse 73   Temp 99 F (37.2 C) (Oral)   Ht 5' 6.5" (1.689 m)   Wt 218 lb (98.9 kg)   SpO2 99%   BMI 34.66 kg/m    Physical Exam: Constitutional: She appears quite well. Skin: Right lateral breast scar incision appears to be healing well.  There is no remarkable ecchymosis, erythema or induration, nor dependent edema or lymphedema appreciable.  SURGICAL PATHOLOGY CASE: [id number] PATIENT: [facility] Surgical Pathology Report  Specimen Submitted: A. 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 - CLIP AND BIOPSY SITE IDENTIFIED. - NEGATIVE FOR DUCTAL CARCINOMA IN SITU AND INVASIVE CARCINOMA.  Comment: Sections show an extensive intraepithelial proliferation, involving and expanding both ducts and lobular units, throughout nearly all sampling of the submitted breast tissue. Focal areas display cytomorphologic features more suggestive of lobular differentiation, and as such, E-cadherin stains were performed on multiple selected tissue blocks, including areas showing central comedo-type necrosis. The abnormal proliferation displays significantly reduced, to completely absent marking for E-cadherin throughout, confirming lobular differentiation. Background benign ductal elements show appropriate strong and membranous internal control staining. This proliferation extends to  multiple surgical margins (posterior, lateral, and medial), although the significance of this is unclear.  Assessment/Plan: This is a 66 y.o. female 13 days s/p right breast excisional biopsy for DCIS, however has component of LCIS/DCIS with comedonecrosis.  There is lobular proliferation at margins of excisional biopsy.  Question need for additional excision.  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   Encounter for screening mammogram for breast cancer 01/24/2022   Estrogen deficiency 01/24/2022   Internal and external bleeding hemorrhoids 08/16/2021   Total knee replacement status 07/28/2018   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   Internal hemorrhoids 07/21/2017   Hydradenitis 07/21/2017   Obesity (BMI 30-39.9) 12/09/2016   Encounter for routine gynecological examination 01/10/2014   Routine general medical examination at a health care facility 03/25/2011   HYPERCHOLESTEROLEMIA 12/11/2009   COLONIC POLYPS, HX OF 08/03/2009   HERNIATED CERVICAL DISC 06/20/2009   Herniated cervical disc 06/20/2009   BACK PAIN, LUMBAR, WITH RADICULOPATHY 10/13/2008   Generalized anxiety disorder 05/24/2007   ALLERGIC RHINITIS 05/24/2007   ROSACEA 05/24/2007   CARDIAC MURMUR 05/24/2007    -She was to be discussed at tumor board yesterday, however I was unable to participate.  I will reach out to Dr. Baruch Gouty and determine what his perspective is, I discussed with the patient the possible need for reexcision, though I am not currently in favor of it.  She is more than willing to proceed with that as needed.  At least we will repeat diagnostic imaging of the right breast in 6 months.  At most all anticipate return to the OR for additional margin excision.  Anticipate further discussion with Dr. Edwyna Ready M.D., [facility] 05/20/2022, 9:58 AM

## 2022-05-20 NOTE — Patient Instructions (Signed)
We will contact April 2024 to schedule your mammogram and breast exam. If you do not hear from our office please call and let us know so we can get this scheduled.

## 2022-05-23 DIAGNOSIS — D2261 Melanocytic nevi of right upper limb, including shoulder: Secondary | ICD-10-CM | POA: Diagnosis not present

## 2022-05-23 DIAGNOSIS — D2262 Melanocytic nevi of left upper limb, including shoulder: Secondary | ICD-10-CM | POA: Diagnosis not present

## 2022-05-23 DIAGNOSIS — D2271 Melanocytic nevi of right lower limb, including hip: Secondary | ICD-10-CM | POA: Diagnosis not present

## 2022-05-23 DIAGNOSIS — D225 Melanocytic nevi of trunk: Secondary | ICD-10-CM | POA: Diagnosis not present

## 2022-05-23 DIAGNOSIS — D485 Neoplasm of uncertain behavior of skin: Secondary | ICD-10-CM | POA: Diagnosis not present

## 2022-05-23 DIAGNOSIS — D2272 Melanocytic nevi of left lower limb, including hip: Secondary | ICD-10-CM | POA: Diagnosis not present

## 2022-05-23 DIAGNOSIS — L718 Other rosacea: Secondary | ICD-10-CM | POA: Diagnosis not present

## 2022-05-23 DIAGNOSIS — L218 Other seborrheic dermatitis: Secondary | ICD-10-CM | POA: Diagnosis not present

## 2022-05-26 ENCOUNTER — Other Ambulatory Visit: Payer: Self-pay | Admitting: *Deleted

## 2022-05-26 ENCOUNTER — Encounter: Payer: Self-pay | Admitting: Radiation Oncology

## 2022-05-26 ENCOUNTER — Ambulatory Visit
Admission: RE | Admit: 2022-05-26 | Discharge: 2022-05-26 | Disposition: A | Discharge status: Home / Self Care | Payer: Medicare PPO | Source: Ambulatory Visit | Attending: Radiation Oncology | Admitting: Radiation Oncology

## 2022-05-26 VITALS — BP 126/75 | HR 64 | Resp 16 | Ht 65.6 in | Wt 217.8 lb

## 2022-05-26 DIAGNOSIS — D0511 Intraductal carcinoma in situ of right breast: Secondary | ICD-10-CM

## 2022-05-26 DIAGNOSIS — Z510 Encounter for antineoplastic radiation therapy: Secondary | ICD-10-CM | POA: Diagnosis not present

## 2022-05-26 DIAGNOSIS — Z170 Estrogen receptor positive status [ER+]: Secondary | ICD-10-CM | POA: Diagnosis not present

## 2022-05-26 NOTE — Progress Notes (Signed)
Radiation Oncology Follow up Note  Name: Michele Cruz   Date:   05/26/2022 MRN:  [id number] DOB: 1956-03-01    This 66 y.o. female presents to the clinic today for follow-up status post wide local excision for ductal carcinoma in situ of the right breast seen initially prior to her wide local excision.  REFERRING PROVIDER: Abner Greenspan, MD  HPI: Patient is a 66 year old female originally consulted back in October when she had a biopsy-proven ductal carcinoma in situ of the right breast ER positive.  She went on to have a wide local excision.  Which showed florid lobular neoplasm lobular carcinoma in situ with extensive ductal involvement and focal comedo type necrosis.  Her previous biopsy site was identified.  This was negative for ductal carcinoma in situ and invasive carcinoma.  She has done well postoperatively specifically denies breast tenderness cough or bone pain.  COMPLICATIONS OF TREATMENT: none  FOLLOW UP COMPLIANCE: keeps appointments   PHYSICAL EXAM:  BP 126/75   Pulse 64   Resp 16   Ht 5' 5.6" (1.666 m)   Wt 217 lb 12.8 oz (98.8 kg)   BMI 35.58 kg/m  Patient status post wide local excision incision is healed well.  No dominant masses noted in either breast.  No axillary or supraclavicular adenopathy is identified.  Well-developed well-nourished patient in NAD. HEENT reveals PERLA, EOMI, discs not visualized.  Oral cavity is clear. No oral mucosal lesions are identified. Neck is clear without evidence of cervical or supraclavicular adenopathy. Lungs are clear to A&P. Cardiac examination is essentially unremarkable with regular rate and rhythm without murmur rub or thrill. Abdomen is benign with no organomegaly or masses noted. Motor sensory and DTR levels are equal and symmetric in the upper and lower extremities. Cranial nerves II through XII are grossly intact. Proprioception is intact. No peripheral adenopathy or edema is identified. No motor or sensory levels are noted.  Crude visual fields are within normal range.  RADIOLOGY RESULTS: Postop mammogram of the right breast ordered  PLAN: This time I am ordering a postop mammogram to see if there is any residual calcifications left in her right breast.  Should that be the case would advocate for possible further surgery.  Based on the fact there is no ductal carcinoma in situ in this specimen we will make determination after mammogram and possibly go ahead with hypofractionated whole breast radiation based on her initial diagnosis of DCIS.  Patient comprehends my recommendations well.  I would like to take this opportunity to thank you for allowing me to participate in the care of your patient.Noreene Filbert, MD

## 2022-06-05 DIAGNOSIS — M9901 Segmental and somatic dysfunction of cervical region: Secondary | ICD-10-CM | POA: Diagnosis not present

## 2022-06-05 DIAGNOSIS — M25512 Pain in left shoulder: Secondary | ICD-10-CM | POA: Diagnosis not present

## 2022-06-05 DIAGNOSIS — M7918 Myalgia, other site: Secondary | ICD-10-CM | POA: Diagnosis not present

## 2022-06-05 DIAGNOSIS — M5413 Radiculopathy, cervicothoracic region: Secondary | ICD-10-CM | POA: Diagnosis not present

## 2022-06-11 ENCOUNTER — Ambulatory Visit
Admission: RE | Admit: 2022-06-11 | Discharge: 2022-06-11 | Disposition: A | Discharge status: Home / Self Care | Payer: Medicare PPO | Source: Ambulatory Visit | Attending: Radiation Oncology | Admitting: Radiation Oncology

## 2022-06-11 DIAGNOSIS — R921 Mammographic calcification found on diagnostic imaging of breast: Secondary | ICD-10-CM | POA: Diagnosis not present

## 2022-06-11 DIAGNOSIS — D0511 Intraductal carcinoma in situ of right breast: Secondary | ICD-10-CM

## 2022-06-12 ENCOUNTER — Encounter: Payer: Self-pay | Admitting: Radiation Oncology

## 2022-06-12 ENCOUNTER — Ambulatory Visit
Admission: RE | Admit: 2022-06-12 | Discharge: 2022-06-12 | Disposition: A | Discharge status: Home / Self Care | Payer: Medicare PPO | Source: Ambulatory Visit | Attending: Radiation Oncology | Admitting: Radiation Oncology

## 2022-06-12 VITALS — BP 118/74 | HR 79 | Temp 98.2°F | Resp 20 | Ht 66.5 in | Wt 214.6 lb

## 2022-06-12 DIAGNOSIS — Z510 Encounter for antineoplastic radiation therapy: Secondary | ICD-10-CM | POA: Diagnosis not present

## 2022-06-12 DIAGNOSIS — C50911 Malignant neoplasm of unspecified site of right female breast: Secondary | ICD-10-CM

## 2022-06-12 DIAGNOSIS — D0511 Intraductal carcinoma in situ of right breast: Secondary | ICD-10-CM | POA: Diagnosis not present

## 2022-06-12 DIAGNOSIS — Z170 Estrogen receptor positive status [ER+]: Secondary | ICD-10-CM | POA: Diagnosis not present

## 2022-06-12 NOTE — Progress Notes (Signed)
Radiation Oncology Follow up Note  Name: Michele Cruz   Date:   06/12/2022 MRN:  [id number] DOB: Apr 06, 1956    This 66 y.o. female presents to the clinic today for postlumpectomy mammogram in patient with ER positive ductal carcinoma in situ status post wide local excision  REFERRING PROVIDER: Tower, Wynelle Fanny, MD  HPI: Patient is a 66 year old female originally consulted for ER positive ductal carcinoma in situ.  Her initial biopsy had ductal carcinoma in situ ER positive although on her wide local excision was [place] lobular neoplasm lobular carcinoma in situ and extensive ductal involvement with focal comedo type necrosis.  Negative for ductal carcinoma in situ or invasive cancer.  I ordered approximate mammogram which was performed.  Showing no evidence of new or recurrent residual breast cancer benign postlumpectomy changes on the right noted.  She is doing well and is healed well.  COMPLICATIONS OF TREATMENT: none  FOLLOW UP COMPLIANCE: keeps appointments   PHYSICAL EXAM:  BP 118/74 (BP Location: Right Arm, Patient Position: Sitting, Cuff Size: Normal)   Pulse 79   Temp 98.2 F (36.8 C) (Tympanic)   Resp 20   Ht 5' 6.5" (1.689 m) Comment: stated HT  Wt 214 lb 9.6 oz (97.3 kg)   BMI 34.12 kg/m  Lungs are clear to A&P cardiac examination essentially unremarkable with regular rate and rhythm. No dominant mass or nodularity is noted in either breast in 2 positions examined. Incision is well-healed. No axillary or supraclavicular adenopathy is appreciated. Cosmetic result is excellent.  Well-developed well-nourished patient in NAD. HEENT reveals PERLA, EOMI, discs not visualized.  Oral cavity is clear. No oral mucosal lesions are identified. Neck is clear without evidence of cervical or supraclavicular adenopathy. Lungs are clear to A&P. Cardiac examination is essentially unremarkable with regular rate and rhythm without murmur rub or thrill. Abdomen is benign with no organomegaly or  masses noted. Motor sensory and DTR levels are equal and symmetric in the upper and lower extremities. Cranial nerves II through XII are grossly intact. Proprioception is intact. No peripheral adenopathy or edema is identified. No motor or sensory levels are noted. Crude visual fields are within normal range.  RADIOLOGY RESULTS: Mammograms reviewed compatible with above-stated findings  PLAN: This type would like to go ahead with hypofractionated whole breast radiation to her right breast.  Will also boost her scar another 1000 cGy using electrons.  Risks and benefits of treatment including skin reaction fatigue alteration blood counts possible inclusion of superficial lung all were discussed in detail with the patient.  I have set her up for simulation tomorrow.  Patient comprehends my recommendations well.  She also will be a candidate for endocrine therapy after completion of radiation.  I would like to take this opportunity to thank you for allowing me to participate in the care of your patient.Noreene Filbert, MD

## 2022-06-13 ENCOUNTER — Encounter: Payer: Self-pay | Admitting: *Deleted

## 2022-06-13 ENCOUNTER — Ambulatory Visit
Admission: RE | Admit: 2022-06-13 | Discharge: 2022-06-13 | Disposition: A | Discharge status: Home / Self Care | Payer: Medicare PPO | Source: Ambulatory Visit | Attending: Radiation Oncology | Admitting: Radiation Oncology

## 2022-06-13 DIAGNOSIS — D0511 Intraductal carcinoma in situ of right breast: Secondary | ICD-10-CM | POA: Diagnosis not present

## 2022-06-13 DIAGNOSIS — Z170 Estrogen receptor positive status [ER+]: Secondary | ICD-10-CM | POA: Diagnosis not present

## 2022-06-13 DIAGNOSIS — Z510 Encounter for antineoplastic radiation therapy: Secondary | ICD-10-CM | POA: Diagnosis not present

## 2022-06-17 DIAGNOSIS — Z510 Encounter for antineoplastic radiation therapy: Secondary | ICD-10-CM | POA: Diagnosis not present

## 2022-06-17 DIAGNOSIS — Z170 Estrogen receptor positive status [ER+]: Secondary | ICD-10-CM | POA: Diagnosis not present

## 2022-06-17 DIAGNOSIS — D0511 Intraductal carcinoma in situ of right breast: Secondary | ICD-10-CM | POA: Diagnosis not present

## 2022-06-20 ENCOUNTER — Other Ambulatory Visit: Payer: Self-pay | Admitting: *Deleted

## 2022-06-20 DIAGNOSIS — D0511 Intraductal carcinoma in situ of right breast: Secondary | ICD-10-CM

## 2022-06-24 ENCOUNTER — Ambulatory Visit: Payer: Medicare PPO

## 2022-06-25 ENCOUNTER — Ambulatory Visit
Admission: RE | Admit: 2022-06-25 | Discharge: 2022-06-25 | Disposition: A | Discharge status: Home / Self Care | Payer: Medicare PPO | Source: Ambulatory Visit | Attending: Radiation Oncology | Admitting: Radiation Oncology

## 2022-06-25 ENCOUNTER — Ambulatory Visit: Payer: Medicare PPO

## 2022-06-25 DIAGNOSIS — Z510 Encounter for antineoplastic radiation therapy: Secondary | ICD-10-CM | POA: Diagnosis not present

## 2022-06-25 DIAGNOSIS — D0511 Intraductal carcinoma in situ of right breast: Secondary | ICD-10-CM | POA: Diagnosis not present

## 2022-06-25 DIAGNOSIS — Z170 Estrogen receptor positive status [ER+]: Secondary | ICD-10-CM | POA: Diagnosis not present

## 2022-06-26 ENCOUNTER — Other Ambulatory Visit: Payer: Self-pay

## 2022-06-26 ENCOUNTER — Ambulatory Visit
Admission: RE | Admit: 2022-06-26 | Discharge: 2022-06-26 | Disposition: A | Discharge status: Home / Self Care | Payer: Medicare PPO | Source: Ambulatory Visit | Attending: Radiation Oncology | Admitting: Radiation Oncology

## 2022-06-26 DIAGNOSIS — Z170 Estrogen receptor positive status [ER+]: Secondary | ICD-10-CM | POA: Diagnosis not present

## 2022-06-26 DIAGNOSIS — D0511 Intraductal carcinoma in situ of right breast: Secondary | ICD-10-CM | POA: Diagnosis not present

## 2022-06-26 DIAGNOSIS — Z510 Encounter for antineoplastic radiation therapy: Secondary | ICD-10-CM | POA: Diagnosis not present

## 2022-06-26 LAB — RAD ONC ARIA SESSION SUMMARY
Course Elapsed Days: 0
Plan Fractions Treated to Date: 1
Plan Prescribed Dose Per Fraction: 2.66 Gy
Plan Total Fractions Prescribed: 16
Plan Total Prescribed Dose: 42.56 Gy
Reference Point Dosage Given to Date: 2.66 Gy
Reference Point Session Dosage Given: 2.66 Gy
Session Number: 1

## 2022-06-27 ENCOUNTER — Ambulatory Visit
Admission: RE | Admit: 2022-06-27 | Discharge: 2022-06-27 | Disposition: A | Discharge status: Home / Self Care | Payer: Medicare PPO | Source: Ambulatory Visit | Attending: Radiation Oncology | Admitting: Radiation Oncology

## 2022-06-27 ENCOUNTER — Other Ambulatory Visit: Payer: Self-pay

## 2022-06-27 DIAGNOSIS — D0511 Intraductal carcinoma in situ of right breast: Secondary | ICD-10-CM | POA: Diagnosis not present

## 2022-06-27 DIAGNOSIS — Z170 Estrogen receptor positive status [ER+]: Secondary | ICD-10-CM | POA: Diagnosis not present

## 2022-06-27 DIAGNOSIS — Z510 Encounter for antineoplastic radiation therapy: Secondary | ICD-10-CM | POA: Diagnosis not present

## 2022-06-27 LAB — RAD ONC ARIA SESSION SUMMARY
Course Elapsed Days: 1
Plan Fractions Treated to Date: 2
Plan Prescribed Dose Per Fraction: 2.66 Gy
Plan Total Fractions Prescribed: 16
Plan Total Prescribed Dose: 42.56 Gy
Reference Point Dosage Given to Date: 5.32 Gy
Reference Point Session Dosage Given: 2.66 Gy
Session Number: 2

## 2022-06-30 ENCOUNTER — Ambulatory Visit
Admission: RE | Admit: 2022-06-30 | Discharge: 2022-06-30 | Disposition: A | Discharge status: Home / Self Care | Payer: Medicare PPO | Source: Ambulatory Visit | Attending: Radiation Oncology | Admitting: Radiation Oncology

## 2022-06-30 ENCOUNTER — Other Ambulatory Visit: Payer: Self-pay

## 2022-06-30 DIAGNOSIS — D0511 Intraductal carcinoma in situ of right breast: Secondary | ICD-10-CM | POA: Diagnosis not present

## 2022-06-30 DIAGNOSIS — Z170 Estrogen receptor positive status [ER+]: Secondary | ICD-10-CM | POA: Diagnosis not present

## 2022-06-30 DIAGNOSIS — Z510 Encounter for antineoplastic radiation therapy: Secondary | ICD-10-CM | POA: Diagnosis not present

## 2022-06-30 LAB — RAD ONC ARIA SESSION SUMMARY
Course Elapsed Days: 4
Plan Fractions Treated to Date: 3
Plan Prescribed Dose Per Fraction: 2.66 Gy
Plan Total Fractions Prescribed: 16
Plan Total Prescribed Dose: 42.56 Gy
Reference Point Dosage Given to Date: 7.98 Gy
Reference Point Session Dosage Given: 2.66 Gy
Session Number: 3

## 2022-07-01 ENCOUNTER — Other Ambulatory Visit: Payer: Self-pay

## 2022-07-01 ENCOUNTER — Inpatient Hospital Stay: Payer: Medicare PPO

## 2022-07-01 ENCOUNTER — Ambulatory Visit
Admission: RE | Admit: 2022-07-01 | Discharge: 2022-07-01 | Disposition: A | Discharge status: Home / Self Care | Payer: Medicare PPO | Source: Ambulatory Visit | Attending: Radiation Oncology | Admitting: Radiation Oncology

## 2022-07-01 DIAGNOSIS — D0511 Intraductal carcinoma in situ of right breast: Secondary | ICD-10-CM | POA: Insufficient documentation

## 2022-07-01 DIAGNOSIS — Z510 Encounter for antineoplastic radiation therapy: Secondary | ICD-10-CM | POA: Diagnosis not present

## 2022-07-01 DIAGNOSIS — Z170 Estrogen receptor positive status [ER+]: Secondary | ICD-10-CM | POA: Diagnosis not present

## 2022-07-01 LAB — CBC
HCT: 43.1 % (ref 36.0–46.0)
Hemoglobin: 14.5 g/dL (ref 12.0–15.0)
MCH: 29.3 pg (ref 26.0–34.0)
MCHC: 33.6 g/dL (ref 30.0–36.0)
MCV: 87.1 fL (ref 80.0–100.0)
Platelets: 204 10*3/uL (ref 150–400)
RBC: 4.95 MIL/uL (ref 3.87–5.11)
RDW: 13.5 % (ref 11.5–15.5)
WBC: 7 10*3/uL (ref 4.0–10.5)
nRBC: 0 % (ref 0.0–0.2)

## 2022-07-01 LAB — RAD ONC ARIA SESSION SUMMARY
Course Elapsed Days: 5
Plan Fractions Treated to Date: 4
Plan Prescribed Dose Per Fraction: 2.66 Gy
Plan Total Fractions Prescribed: 16
Plan Total Prescribed Dose: 42.56 Gy
Reference Point Dosage Given to Date: 10.64 Gy
Reference Point Session Dosage Given: 2.66 Gy
Session Number: 4

## 2022-07-02 ENCOUNTER — Ambulatory Visit: Payer: Medicare PPO

## 2022-07-03 ENCOUNTER — Ambulatory Visit
Admission: RE | Admit: 2022-07-03 | Discharge: 2022-07-03 | Disposition: A | Discharge status: Home / Self Care | Payer: Medicare PPO | Source: Ambulatory Visit | Attending: Radiation Oncology | Admitting: Radiation Oncology

## 2022-07-03 ENCOUNTER — Other Ambulatory Visit: Payer: Self-pay

## 2022-07-03 DIAGNOSIS — D0511 Intraductal carcinoma in situ of right breast: Secondary | ICD-10-CM | POA: Diagnosis not present

## 2022-07-03 DIAGNOSIS — Z510 Encounter for antineoplastic radiation therapy: Secondary | ICD-10-CM | POA: Diagnosis not present

## 2022-07-03 DIAGNOSIS — Z170 Estrogen receptor positive status [ER+]: Secondary | ICD-10-CM | POA: Diagnosis not present

## 2022-07-03 LAB — RAD ONC ARIA SESSION SUMMARY
Course Elapsed Days: 7
Plan Fractions Treated to Date: 5
Plan Prescribed Dose Per Fraction: 2.66 Gy
Plan Total Fractions Prescribed: 16
Plan Total Prescribed Dose: 42.56 Gy
Reference Point Dosage Given to Date: 13.3 Gy
Reference Point Session Dosage Given: 2.66 Gy
Session Number: 5

## 2022-07-04 ENCOUNTER — Other Ambulatory Visit: Payer: Self-pay

## 2022-07-04 ENCOUNTER — Ambulatory Visit
Admission: RE | Admit: 2022-07-04 | Discharge: 2022-07-04 | Disposition: A | Discharge status: Home / Self Care | Payer: Medicare PPO | Source: Ambulatory Visit | Attending: Radiation Oncology | Admitting: Radiation Oncology

## 2022-07-04 DIAGNOSIS — Z510 Encounter for antineoplastic radiation therapy: Secondary | ICD-10-CM | POA: Diagnosis not present

## 2022-07-04 DIAGNOSIS — Z170 Estrogen receptor positive status [ER+]: Secondary | ICD-10-CM | POA: Diagnosis not present

## 2022-07-04 DIAGNOSIS — D0511 Intraductal carcinoma in situ of right breast: Secondary | ICD-10-CM | POA: Diagnosis not present

## 2022-07-04 LAB — RAD ONC ARIA SESSION SUMMARY
Course Elapsed Days: 8
Plan Fractions Treated to Date: 6
Plan Prescribed Dose Per Fraction: 2.66 Gy
Plan Total Fractions Prescribed: 16
Plan Total Prescribed Dose: 42.56 Gy
Reference Point Dosage Given to Date: 15.96 Gy
Reference Point Session Dosage Given: 2.66 Gy
Session Number: 6

## 2022-07-07 ENCOUNTER — Ambulatory Visit
Admission: RE | Admit: 2022-07-07 | Discharge: 2022-07-07 | Disposition: A | Discharge status: Home / Self Care | Payer: Medicare PPO | Source: Ambulatory Visit | Attending: Radiation Oncology | Admitting: Radiation Oncology

## 2022-07-07 ENCOUNTER — Other Ambulatory Visit: Payer: Self-pay

## 2022-07-07 DIAGNOSIS — Z170 Estrogen receptor positive status [ER+]: Secondary | ICD-10-CM | POA: Diagnosis not present

## 2022-07-07 DIAGNOSIS — D0511 Intraductal carcinoma in situ of right breast: Secondary | ICD-10-CM | POA: Diagnosis not present

## 2022-07-07 DIAGNOSIS — Z510 Encounter for antineoplastic radiation therapy: Secondary | ICD-10-CM | POA: Diagnosis not present

## 2022-07-07 LAB — RAD ONC ARIA SESSION SUMMARY
Course Elapsed Days: 11
Plan Fractions Treated to Date: 7
Plan Prescribed Dose Per Fraction: 2.66 Gy
Plan Total Fractions Prescribed: 16
Plan Total Prescribed Dose: 42.56 Gy
Reference Point Dosage Given to Date: 18.62 Gy
Reference Point Session Dosage Given: 2.66 Gy
Session Number: 7

## 2022-07-08 ENCOUNTER — Ambulatory Visit
Admission: RE | Admit: 2022-07-08 | Discharge: 2022-07-08 | Disposition: A | Discharge status: Home / Self Care | Payer: Medicare PPO | Source: Ambulatory Visit | Attending: Radiation Oncology | Admitting: Radiation Oncology

## 2022-07-08 ENCOUNTER — Other Ambulatory Visit: Payer: Self-pay

## 2022-07-08 DIAGNOSIS — Z170 Estrogen receptor positive status [ER+]: Secondary | ICD-10-CM | POA: Diagnosis not present

## 2022-07-08 DIAGNOSIS — Z510 Encounter for antineoplastic radiation therapy: Secondary | ICD-10-CM | POA: Diagnosis not present

## 2022-07-08 DIAGNOSIS — D0511 Intraductal carcinoma in situ of right breast: Secondary | ICD-10-CM | POA: Diagnosis not present

## 2022-07-08 LAB — RAD ONC ARIA SESSION SUMMARY
Course Elapsed Days: 12
Plan Fractions Treated to Date: 8
Plan Prescribed Dose Per Fraction: 2.66 Gy
Plan Total Fractions Prescribed: 16
Plan Total Prescribed Dose: 42.56 Gy
Reference Point Dosage Given to Date: 21.28 Gy
Reference Point Session Dosage Given: 2.66 Gy
Session Number: 8

## 2022-07-09 ENCOUNTER — Ambulatory Visit
Admission: RE | Admit: 2022-07-09 | Discharge: 2022-07-09 | Disposition: A | Discharge status: Home / Self Care | Payer: Medicare PPO | Source: Ambulatory Visit | Attending: Radiation Oncology | Admitting: Radiation Oncology

## 2022-07-09 ENCOUNTER — Inpatient Hospital Stay: Payer: Medicare PPO

## 2022-07-09 ENCOUNTER — Other Ambulatory Visit: Payer: Self-pay

## 2022-07-09 DIAGNOSIS — D0511 Intraductal carcinoma in situ of right breast: Secondary | ICD-10-CM | POA: Diagnosis not present

## 2022-07-09 DIAGNOSIS — Z510 Encounter for antineoplastic radiation therapy: Secondary | ICD-10-CM | POA: Diagnosis not present

## 2022-07-09 DIAGNOSIS — Z170 Estrogen receptor positive status [ER+]: Secondary | ICD-10-CM | POA: Diagnosis not present

## 2022-07-09 LAB — RAD ONC ARIA SESSION SUMMARY
Course Elapsed Days: 13
Plan Fractions Treated to Date: 9
Plan Prescribed Dose Per Fraction: 2.66 Gy
Plan Total Fractions Prescribed: 16
Plan Total Prescribed Dose: 42.56 Gy
Reference Point Dosage Given to Date: 23.94 Gy
Reference Point Session Dosage Given: 2.66 Gy
Session Number: 9

## 2022-07-10 ENCOUNTER — Ambulatory Visit
Admission: RE | Admit: 2022-07-10 | Discharge: 2022-07-10 | Disposition: A | Discharge status: Home / Self Care | Payer: Medicare PPO | Source: Ambulatory Visit | Attending: Radiation Oncology | Admitting: Radiation Oncology

## 2022-07-10 ENCOUNTER — Other Ambulatory Visit: Payer: Self-pay

## 2022-07-10 DIAGNOSIS — Z170 Estrogen receptor positive status [ER+]: Secondary | ICD-10-CM | POA: Diagnosis not present

## 2022-07-10 DIAGNOSIS — D0511 Intraductal carcinoma in situ of right breast: Secondary | ICD-10-CM | POA: Diagnosis not present

## 2022-07-10 DIAGNOSIS — Z510 Encounter for antineoplastic radiation therapy: Secondary | ICD-10-CM | POA: Diagnosis not present

## 2022-07-10 LAB — RAD ONC ARIA SESSION SUMMARY
Course Elapsed Days: 14
Plan Fractions Treated to Date: 10
Plan Prescribed Dose Per Fraction: 2.66 Gy
Plan Total Fractions Prescribed: 16
Plan Total Prescribed Dose: 42.56 Gy
Reference Point Dosage Given to Date: 26.6 Gy
Reference Point Session Dosage Given: 2.66 Gy
Session Number: 10

## 2022-07-11 ENCOUNTER — Ambulatory Visit
Admission: RE | Admit: 2022-07-11 | Discharge: 2022-07-11 | Disposition: A | Discharge status: Home / Self Care | Payer: Medicare PPO | Source: Ambulatory Visit | Attending: Radiation Oncology | Admitting: Radiation Oncology

## 2022-07-11 ENCOUNTER — Other Ambulatory Visit: Payer: Self-pay

## 2022-07-11 ENCOUNTER — Other Ambulatory Visit: Payer: Medicare PPO

## 2022-07-11 DIAGNOSIS — D0511 Intraductal carcinoma in situ of right breast: Secondary | ICD-10-CM | POA: Diagnosis not present

## 2022-07-11 DIAGNOSIS — Z170 Estrogen receptor positive status [ER+]: Secondary | ICD-10-CM | POA: Diagnosis not present

## 2022-07-11 DIAGNOSIS — Z510 Encounter for antineoplastic radiation therapy: Secondary | ICD-10-CM | POA: Diagnosis not present

## 2022-07-11 LAB — RAD ONC ARIA SESSION SUMMARY
Course Elapsed Days: 15
Plan Fractions Treated to Date: 11
Plan Prescribed Dose Per Fraction: 2.66 Gy
Plan Total Fractions Prescribed: 16
Plan Total Prescribed Dose: 42.56 Gy
Reference Point Dosage Given to Date: 29.26 Gy
Reference Point Session Dosage Given: 2.66 Gy
Session Number: 11

## 2022-07-12 DIAGNOSIS — D0511 Intraductal carcinoma in situ of right breast: Secondary | ICD-10-CM | POA: Diagnosis not present

## 2022-07-12 DIAGNOSIS — Z510 Encounter for antineoplastic radiation therapy: Secondary | ICD-10-CM | POA: Diagnosis not present

## 2022-07-12 DIAGNOSIS — Z170 Estrogen receptor positive status [ER+]: Secondary | ICD-10-CM | POA: Diagnosis not present

## 2022-07-14 ENCOUNTER — Other Ambulatory Visit: Payer: Self-pay

## 2022-07-14 ENCOUNTER — Ambulatory Visit
Admission: RE | Admit: 2022-07-14 | Discharge: 2022-07-14 | Disposition: A | Discharge status: Home / Self Care | Payer: Medicare PPO | Source: Ambulatory Visit | Attending: Radiation Oncology | Admitting: Radiation Oncology

## 2022-07-14 DIAGNOSIS — D0511 Intraductal carcinoma in situ of right breast: Secondary | ICD-10-CM | POA: Diagnosis not present

## 2022-07-14 DIAGNOSIS — Z510 Encounter for antineoplastic radiation therapy: Secondary | ICD-10-CM | POA: Diagnosis not present

## 2022-07-14 DIAGNOSIS — Z170 Estrogen receptor positive status [ER+]: Secondary | ICD-10-CM | POA: Diagnosis not present

## 2022-07-14 LAB — RAD ONC ARIA SESSION SUMMARY
Course Elapsed Days: 18
Plan Fractions Treated to Date: 12
Plan Prescribed Dose Per Fraction: 2.66 Gy
Plan Total Fractions Prescribed: 16
Plan Total Prescribed Dose: 42.56 Gy
Reference Point Dosage Given to Date: 31.92 Gy
Reference Point Session Dosage Given: 2.66 Gy
Session Number: 12

## 2022-07-15 ENCOUNTER — Ambulatory Visit
Admission: RE | Admit: 2022-07-15 | Discharge: 2022-07-15 | Disposition: A | Discharge status: Home / Self Care | Payer: Medicare PPO | Source: Ambulatory Visit | Attending: Radiation Oncology | Admitting: Radiation Oncology

## 2022-07-15 ENCOUNTER — Other Ambulatory Visit: Payer: Self-pay

## 2022-07-15 DIAGNOSIS — D0511 Intraductal carcinoma in situ of right breast: Secondary | ICD-10-CM | POA: Diagnosis not present

## 2022-07-15 DIAGNOSIS — Z510 Encounter for antineoplastic radiation therapy: Secondary | ICD-10-CM | POA: Diagnosis not present

## 2022-07-15 DIAGNOSIS — Z170 Estrogen receptor positive status [ER+]: Secondary | ICD-10-CM | POA: Diagnosis not present

## 2022-07-15 LAB — RAD ONC ARIA SESSION SUMMARY
Course Elapsed Days: 19
Plan Fractions Treated to Date: 13
Plan Prescribed Dose Per Fraction: 2.66 Gy
Plan Total Fractions Prescribed: 16
Plan Total Prescribed Dose: 42.56 Gy
Reference Point Dosage Given to Date: 34.58 Gy
Reference Point Session Dosage Given: 2.66 Gy
Session Number: 13

## 2022-07-16 ENCOUNTER — Ambulatory Visit: Payer: Medicare PPO

## 2022-07-16 ENCOUNTER — Ambulatory Visit
Admission: RE | Admit: 2022-07-16 | Discharge: 2022-07-16 | Disposition: A | Discharge status: Home / Self Care | Payer: Medicare PPO | Source: Ambulatory Visit | Attending: Radiation Oncology | Admitting: Radiation Oncology

## 2022-07-16 ENCOUNTER — Inpatient Hospital Stay: Payer: Medicare PPO

## 2022-07-16 ENCOUNTER — Other Ambulatory Visit: Payer: Self-pay

## 2022-07-16 DIAGNOSIS — Z510 Encounter for antineoplastic radiation therapy: Secondary | ICD-10-CM | POA: Diagnosis not present

## 2022-07-16 DIAGNOSIS — Z170 Estrogen receptor positive status [ER+]: Secondary | ICD-10-CM | POA: Diagnosis not present

## 2022-07-16 DIAGNOSIS — D0511 Intraductal carcinoma in situ of right breast: Secondary | ICD-10-CM | POA: Diagnosis not present

## 2022-07-16 LAB — RAD ONC ARIA SESSION SUMMARY
Course Elapsed Days: 20
Plan Fractions Treated to Date: 14
Plan Prescribed Dose Per Fraction: 2.66 Gy
Plan Total Fractions Prescribed: 16
Plan Total Prescribed Dose: 42.56 Gy
Reference Point Dosage Given to Date: 37.24 Gy
Reference Point Session Dosage Given: 2.66 Gy
Session Number: 14

## 2022-07-16 LAB — CBC
HCT: 49.1 % — ABNORMAL HIGH (ref 36.0–46.0)
Hemoglobin: 15.8 g/dL — ABNORMAL HIGH (ref 12.0–15.0)
MCH: 29.4 pg (ref 26.0–34.0)
MCHC: 32.2 g/dL (ref 30.0–36.0)
MCV: 91.4 fL (ref 80.0–100.0)
Platelets: 180 10*3/uL (ref 150–400)
RBC: 5.37 MIL/uL — ABNORMAL HIGH (ref 3.87–5.11)
RDW: 13.5 % (ref 11.5–15.5)
WBC: 4.8 10*3/uL (ref 4.0–10.5)
nRBC: 0 % (ref 0.0–0.2)

## 2022-07-17 ENCOUNTER — Other Ambulatory Visit: Payer: Self-pay

## 2022-07-17 ENCOUNTER — Ambulatory Visit
Admission: RE | Admit: 2022-07-17 | Discharge: 2022-07-17 | Disposition: A | Discharge status: Home / Self Care | Payer: Medicare PPO | Source: Ambulatory Visit | Attending: Radiation Oncology | Admitting: Radiation Oncology

## 2022-07-17 DIAGNOSIS — Z170 Estrogen receptor positive status [ER+]: Secondary | ICD-10-CM | POA: Diagnosis not present

## 2022-07-17 DIAGNOSIS — Z510 Encounter for antineoplastic radiation therapy: Secondary | ICD-10-CM | POA: Diagnosis not present

## 2022-07-17 DIAGNOSIS — D0511 Intraductal carcinoma in situ of right breast: Secondary | ICD-10-CM | POA: Diagnosis not present

## 2022-07-17 LAB — RAD ONC ARIA SESSION SUMMARY
Course Elapsed Days: 21
Plan Fractions Treated to Date: 15
Plan Prescribed Dose Per Fraction: 2.66 Gy
Plan Total Fractions Prescribed: 16
Plan Total Prescribed Dose: 42.56 Gy
Reference Point Dosage Given to Date: 39.9 Gy
Reference Point Session Dosage Given: 2.66 Gy
Session Number: 15

## 2022-07-18 ENCOUNTER — Ambulatory Visit: Admission: RE | Admit: 2022-07-18 | Payer: Medicare PPO | Source: Ambulatory Visit

## 2022-07-18 ENCOUNTER — Ambulatory Visit
Admission: RE | Admit: 2022-07-18 | Discharge: 2022-07-18 | Disposition: A | Discharge status: Home / Self Care | Payer: Medicare PPO | Source: Ambulatory Visit | Attending: Radiation Oncology | Admitting: Radiation Oncology

## 2022-07-18 ENCOUNTER — Other Ambulatory Visit: Payer: Self-pay

## 2022-07-18 DIAGNOSIS — Z170 Estrogen receptor positive status [ER+]: Secondary | ICD-10-CM | POA: Diagnosis not present

## 2022-07-18 DIAGNOSIS — D0511 Intraductal carcinoma in situ of right breast: Secondary | ICD-10-CM | POA: Diagnosis not present

## 2022-07-18 DIAGNOSIS — Z510 Encounter for antineoplastic radiation therapy: Secondary | ICD-10-CM | POA: Diagnosis not present

## 2022-07-18 LAB — RAD ONC ARIA SESSION SUMMARY
Course Elapsed Days: 22
Plan Fractions Treated to Date: 16
Plan Prescribed Dose Per Fraction: 2.66 Gy
Plan Total Fractions Prescribed: 16
Plan Total Prescribed Dose: 42.56 Gy
Reference Point Dosage Given to Date: 42.56 Gy
Reference Point Session Dosage Given: 2.66 Gy
Session Number: 16

## 2022-07-21 ENCOUNTER — Other Ambulatory Visit: Payer: Self-pay

## 2022-07-21 ENCOUNTER — Ambulatory Visit
Admission: RE | Admit: 2022-07-21 | Discharge: 2022-07-21 | Disposition: A | Discharge status: Home / Self Care | Payer: Medicare PPO | Source: Ambulatory Visit | Attending: Radiation Oncology | Admitting: Radiation Oncology

## 2022-07-21 DIAGNOSIS — D0511 Intraductal carcinoma in situ of right breast: Secondary | ICD-10-CM | POA: Diagnosis not present

## 2022-07-21 DIAGNOSIS — Z510 Encounter for antineoplastic radiation therapy: Secondary | ICD-10-CM | POA: Diagnosis not present

## 2022-07-21 LAB — RAD ONC ARIA SESSION SUMMARY
Course Elapsed Days: 25
Plan Fractions Treated to Date: 1
Plan Prescribed Dose Per Fraction: 2 Gy
Plan Total Fractions Prescribed: 5
Plan Total Prescribed Dose: 10 Gy
Reference Point Dosage Given to Date: 2 Gy
Reference Point Session Dosage Given: 2 Gy
Session Number: 17

## 2022-07-22 ENCOUNTER — Ambulatory Visit
Admission: RE | Admit: 2022-07-22 | Discharge: 2022-07-22 | Disposition: A | Discharge status: Home / Self Care | Payer: Medicare PPO | Source: Ambulatory Visit | Attending: Radiation Oncology | Admitting: Radiation Oncology

## 2022-07-22 ENCOUNTER — Other Ambulatory Visit: Payer: Self-pay

## 2022-07-22 DIAGNOSIS — Z510 Encounter for antineoplastic radiation therapy: Secondary | ICD-10-CM | POA: Diagnosis not present

## 2022-07-22 DIAGNOSIS — D0511 Intraductal carcinoma in situ of right breast: Secondary | ICD-10-CM | POA: Diagnosis not present

## 2022-07-22 LAB — RAD ONC ARIA SESSION SUMMARY
Course Elapsed Days: 26
Plan Fractions Treated to Date: 2
Plan Prescribed Dose Per Fraction: 2 Gy
Plan Total Fractions Prescribed: 5
Plan Total Prescribed Dose: 10 Gy
Reference Point Dosage Given to Date: 4 Gy
Reference Point Session Dosage Given: 2 Gy
Session Number: 18

## 2022-07-23 ENCOUNTER — Ambulatory Visit
Admission: RE | Admit: 2022-07-23 | Discharge: 2022-07-23 | Disposition: A | Discharge status: Home / Self Care | Payer: Medicare PPO | Source: Ambulatory Visit | Attending: Radiation Oncology | Admitting: Radiation Oncology

## 2022-07-23 ENCOUNTER — Ambulatory Visit: Payer: Medicare PPO

## 2022-07-23 ENCOUNTER — Other Ambulatory Visit: Payer: Self-pay

## 2022-07-23 DIAGNOSIS — D0511 Intraductal carcinoma in situ of right breast: Secondary | ICD-10-CM | POA: Diagnosis not present

## 2022-07-23 DIAGNOSIS — Z510 Encounter for antineoplastic radiation therapy: Secondary | ICD-10-CM | POA: Diagnosis not present

## 2022-07-23 LAB — RAD ONC ARIA SESSION SUMMARY
Course Elapsed Days: 27
Plan Fractions Treated to Date: 3
Plan Prescribed Dose Per Fraction: 2 Gy
Plan Total Fractions Prescribed: 5
Plan Total Prescribed Dose: 10 Gy
Reference Point Dosage Given to Date: 6 Gy
Reference Point Session Dosage Given: 2 Gy
Session Number: 19

## 2022-07-24 ENCOUNTER — Other Ambulatory Visit: Payer: Self-pay

## 2022-07-24 ENCOUNTER — Ambulatory Visit
Admission: RE | Admit: 2022-07-24 | Discharge: 2022-07-24 | Disposition: A | Discharge status: Home / Self Care | Payer: Medicare PPO | Source: Ambulatory Visit | Attending: Radiation Oncology | Admitting: Radiation Oncology

## 2022-07-24 ENCOUNTER — Ambulatory Visit: Payer: Medicare PPO

## 2022-07-24 DIAGNOSIS — D0511 Intraductal carcinoma in situ of right breast: Secondary | ICD-10-CM | POA: Diagnosis not present

## 2022-07-24 DIAGNOSIS — Z510 Encounter for antineoplastic radiation therapy: Secondary | ICD-10-CM | POA: Insufficient documentation

## 2022-07-24 LAB — RAD ONC ARIA SESSION SUMMARY
Course Elapsed Days: 28
Plan Fractions Treated to Date: 4
Plan Prescribed Dose Per Fraction: 2 Gy
Plan Total Fractions Prescribed: 5
Plan Total Prescribed Dose: 10 Gy
Reference Point Dosage Given to Date: 8 Gy
Reference Point Session Dosage Given: 2 Gy
Session Number: 20

## 2022-07-25 ENCOUNTER — Ambulatory Visit
Admission: RE | Admit: 2022-07-25 | Discharge: 2022-07-25 | Disposition: A | Discharge status: Home / Self Care | Payer: Medicare PPO | Source: Ambulatory Visit | Attending: Radiation Oncology | Admitting: Radiation Oncology

## 2022-07-25 ENCOUNTER — Encounter: Payer: Self-pay | Admitting: *Deleted

## 2022-07-25 ENCOUNTER — Other Ambulatory Visit: Payer: Self-pay

## 2022-07-25 DIAGNOSIS — Z510 Encounter for antineoplastic radiation therapy: Secondary | ICD-10-CM | POA: Diagnosis not present

## 2022-07-25 DIAGNOSIS — D0511 Intraductal carcinoma in situ of right breast: Secondary | ICD-10-CM | POA: Diagnosis not present

## 2022-07-25 LAB — RAD ONC ARIA SESSION SUMMARY
Course Elapsed Days: 29
Plan Fractions Treated to Date: 5
Plan Prescribed Dose Per Fraction: 2 Gy
Plan Total Fractions Prescribed: 5
Plan Total Prescribed Dose: 10 Gy
Reference Point Dosage Given to Date: 10 Gy
Reference Point Session Dosage Given: 2 Gy
Session Number: 21

## 2022-08-13 ENCOUNTER — Ambulatory Visit: Payer: Medicare PPO | Admitting: Oncology

## 2022-08-13 DIAGNOSIS — H26493 Other secondary cataract, bilateral: Secondary | ICD-10-CM | POA: Diagnosis not present

## 2022-08-14 ENCOUNTER — Encounter: Payer: Self-pay | Admitting: Oncology

## 2022-08-14 ENCOUNTER — Inpatient Hospital Stay: Payer: Medicare PPO | Attending: Oncology | Admitting: Oncology

## 2022-08-14 ENCOUNTER — Encounter: Payer: Self-pay | Admitting: *Deleted

## 2022-08-14 VITALS — BP 129/71 | HR 75 | Temp 98.2°F | Resp 18 | Wt 222.3 lb

## 2022-08-14 DIAGNOSIS — D0511 Intraductal carcinoma in situ of right breast: Secondary | ICD-10-CM | POA: Insufficient documentation

## 2022-08-14 DIAGNOSIS — Z79811 Long term (current) use of aromatase inhibitors: Secondary | ICD-10-CM | POA: Diagnosis not present

## 2022-08-14 MED ORDER — ANASTROZOLE 1 MG PO TABS: 1.0000 mg | ORAL_TABLET | Freq: Every day | ORAL | 2 refills | Status: DC

## 2022-08-14 NOTE — Assessment & Plan Note (Signed)
Recommend calcium 1230m daily and Vitamin D supplementation.

## 2022-08-14 NOTE — Assessment & Plan Note (Addendum)
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 S/p adjuvant RT.  Declines genetic testing.  Rationale of using aromatase inhibitor -Arimidex  discussed with patient.  Side effects of Arimidex including but not limited to hot flush, joint pain, fatigue, mood swing, osteoporosis discussed with patient. Patient voices understanding and willing to proceed.  Obtain DEXA

## 2022-08-14 NOTE — Progress Notes (Signed)
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 S/p adjuvant RT.  Declines genetic testing.  Rationale of using aromatase inhibitor -Arimidex  discussed with patient.  Side effects of Arimidex including but not limited to hot flush, joint pain, fatigue, mood swing, osteoporosis discussed with patient. Patient voices understanding and willing to proceed.  Obtain DEXA  Aromatase inhibitor use Recommend calcium 1236m daily and Vitamin D supplementation.   Orders Placed This Encounter  Procedures   DG Bone Density    Standing Status:   Future    Standing Expiration Date:   08/15/2023    Order Specific Question:   Reason for Exam (SYMPTOM  OR DIAGNOSIS REQUIRED)    Answer:   DCIS right breast    Order Specific Question:   Preferred imaging location?    Answer:   [facility]   [place] Differential [place]    Standing Status:   Future    Standing Expiration Date:   08/15/2023   [place] [facility] [place]    Standing Status:   Future    Standing Expiration Date:   08/15/2023   Follow-up  3 months.  All questions were answered. The patient knows to call the clinic with any problems, questions or concerns.  ZEarlie Server MD, PhD [facility] 08/14/2022       HISTORY OF PRESENTING ILLNESS:  KYisel Schoolman89y.o. female presents to establish care for right breast DCIS I have reviewed her chart and materials related to her cancer extensively and collaborated history with the patient.  Summary of oncologic history is as follows: Oncology History  DCIS (ductal carcinoma in situ)  02/10/2022 Imaging   Bilateral screening mammogram showed further evaluation suggested for calcifications in the right breast.   02/20/2022 Mammogram   Unilateral right diagnostic mammogram showed  Round and punctate calcifications are identified in the lateral central right breast accounting for the screening findings. These calcifications span up to 6 mm and do not layer.   04/01/2022 Initial Diagnosis   DCIS (ductal carcinoma in situ)  03/28/2022 right breast biopsy DCIS, intemediate to high grade, solid and cribriform types with focal necrosis and extensive cancerization of lobules.  ER+100%, PR+50%   Menarche at age of 89First live birth at age of 89OCP use: 5 years Menopausal status: Postmenopausal History of HRT use: Denies History of chest radiation: Denies Number of previous breast biopsies: Denies  Patient denies any family history of breast cancer.   04/02/2022 Cancer Staging   Staging form: Breast, AJCC 8th Edition - Clinical stage from 04/02/2022: Stage 0 (cTis (DCIS), cN0, cM0, ER+, PR+, HER2: Not Assessed) - Signed by YEarlie Server MD on 04/02/2022 Stage prefix: Initial diagnosis   05/07/2022 Surgery   Patient underwent right breast lumpectomy  Pathology showed florid lobular neoplasm (lobular carcinoma in situ) with extensive ductal involvement and focal comedo type necrosis.  Negative for DCIS and invasive carcinoma.    Radiation Therapy     06/26/2022 - 07/25/2022 Radiation Therapy   S/p adjuvant breast radiation.  Patient presents to discuss adjuvant endocrine therapy. No new complaints.   MEDICAL HISTORY:  Past Medical History:  Diagnosis Date   Allergic rhinitis    Anemia, iron deficiency    Anxiety    Arthritis    Asthma    no symptoms last 25 years   Colon polyps    COVID-19 07/2020   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   Family history of  adverse reaction to anesthesia    sister PONV   Heart murmur    Heavy menses    Hemorrhoids    Hidradenitis suppurativa    History of exercise stress test 06/1999   neg   History of methicillin resistant staphylococcus aureus (MRSA) 2019   + surgical pcr swab   History of MRI 2003   neg per patient   History of MRI 2007   right quad fatty defect   History of MRI of spine 03/2004   C-5, C5-6 disk protrusion   Hyperlipidemia    LDL   Rosacea    Spondylosis    deg lumbar disc dz   Wisdom teeth removed    Zoster     SURGICAL HISTORY: Past Surgical History:  Procedure Laterality Date   BREAST BIOPSY Right 03/28/2022   breast center in [place]  03/2002   BREAST CYST ASPIRATION  03/2004   left   BREAST LUMPECTOMY WITH RADIOFREQUENCY TAG IDENTIFICATION Right 05/07/2022   Procedure: BREAST LUMPECTOMY WITH RADIOFREQUENCY TAG IDENTIFICATION;  Surgeon: Ronny Bacon, MD;  Location: [facility] ORS;  Service: General;  Laterality: Right;   CATARACT EXTRACTION Bilateral    CESAREAN SECTION     COLONOSCOPY  01/26/2012   HEMORRHOID BANDING  08/2021   KNEE ARTHROPLASTY Left 03/15/2018   Procedure: COMPUTER ASSISTED TOTAL KNEE ARTHROPLASTY;  Surgeon: Dereck Leep, MD;  Location: [facility] ORS;  Service: Orthopedics;  Laterality: Left;   KNEE ARTHROPLASTY Right 07/28/2018   Procedure: COMPUTER ASSISTED TOTAL KNEE ARTHROPLASTY;  Surgeon: Dereck Leep, MD;  Location: [facility] ORS;  Service: Orthopedics;  Laterality: Right;   KNEE SURGERY Right     SOCIAL HISTORY: Social History   Socioeconomic History   Marital status: Married    Spouse name: Not on file   Number of children: 1   Years of education: Not on file   Highest education level: Not on file  Occupational History   Occupation: INSTRUCTIONAL ASST    Comment: Secretary/administrator  Tobacco Use   Smoking status: Never   Smokeless tobacco: Never  Vaping Use   Vaping Use: Never used  Substance and Sexual  Activity   Alcohol use: Yes    Alcohol/week: 4.0 standard drinks of alcohol    Types: 2 Glasses of wine, 2 Cans of beer per week    Comment: 2-4 glasses wine or beer weekly per pt.   Drug use: No   Sexual activity: Not on file  Other Topics Concern   Not on file  Social History Narrative   One daughter   Daily caffeine; use 1-2 daily   Social Determinants of Health   Financial Resource Strain: Not on file  Food Insecurity: Not on file  Transportation Needs: Not on file  Physical Activity: Not on file  Stress: Not on file  Social Connections: Not on file  Intimate Partner Violence: Not on file    FAMILY HISTORY: Family History  Problem Relation Age of Onset   Arthritis Mother  OA   Obesity Mother    Diverticulitis Mother    Colon polyps Mother    Heart disease Father        CAD   Alcohol abuse Father    Diabetes Father    Hyperlipidemia Sister    Diverticulitis Sister    Diabetes Other    Diverticulitis Sister    Colon cancer Neg Hx    Breast cancer Neg Hx    Esophageal cancer Neg Hx    Rectal cancer Neg Hx    Stomach cancer Neg Hx     ALLERGIES:  is allergic to decadron [dexamethasone], crestor [rosuvastatin], depo-medrol [methylprednisolone acetate], lipitor [atorvastatin calcium], meloxicam, and simvastatin.  MEDICATIONS:  Current Outpatient Medications  Medication Sig Dispense Refill   anastrozole (ARIMIDEX) 1 MG tablet Take 1 tablet (1 mg total) by mouth daily. 30 tablet 2   Ascorbic Acid (VITAMIN C) 500 MG CHEW Chew 1 tablet by mouth daily at 6 (six) AM.     Cholecalciferol (VITAMIN D) 125 MCG (5000 UT) CAPS Take 1 capsule by mouth daily at 6 (six) AM.     Coenzyme Q10 (CO Q 10) 100 MG CAPS Take 1 capsule by mouth daily at 6 (six) AM.     COLLAGEN PO Take 1 Scoop by mouth daily. BioTRUST Multi-collagen, 10 g bioactive daily     fexofenadine (ALLEGRA) 180 MG tablet Take 180 mg by mouth every morning.     Magnesium 500 MG CAPS Take 1 capsule by mouth  daily at 6 (six) AM.     Misc Natural Products (TURMERIC CURCUMIN) CAPS Take 1 capsule by mouth daily. Theracurmin     Multiple Vitamin (MULTIVITAMIN) tablet Take 1 tablet by mouth daily.     Omega-3 Fatty Acids (FISH OIL) 1000 MG CAPS Take 1 capsule by mouth daily.     No current facility-administered medications for this visit.    Review of Systems  Constitutional:  Negative for appetite change, chills, fatigue and fever.  HENT:   Negative for hearing loss and voice change.   Eyes:  Negative for eye problems.  Respiratory:  Negative for chest tightness and cough.   Cardiovascular:  Negative for chest pain.  Gastrointestinal:  Negative for abdominal distention, abdominal pain and blood in stool.  Endocrine: Negative for hot flashes.  Genitourinary:  Negative for difficulty urinating and frequency.   Musculoskeletal:  Negative for arthralgias.  Skin:  Negative for itching and rash.  Neurological:  Negative for extremity weakness.  Hematological:  Negative for adenopathy.  Psychiatric/Behavioral:  Negative for confusion.      PHYSICAL EXAMINATION: ECOG PERFORMANCE STATUS: 0 - Asymptomatic  Vitals:   08/14/22 1321  BP: 129/71  Pulse: 75  Resp: 18  Temp: 98.2 F (36.8 C)  SpO2: 100%   Filed Weights   08/14/22 1321  Weight: 222 lb 4.8 oz (100.8 kg)    Physical Exam Constitutional:      General: She is not in acute distress.    Appearance: She is not diaphoretic.  HENT:     Head: Normocephalic and atraumatic.     Mouth/Throat:     Pharynx: No oropharyngeal exudate.  Eyes:     General: No scleral icterus. Cardiovascular:     Rate and Rhythm: Normal rate.     Heart sounds: No murmur heard. Pulmonary:     Effort: Pulmonary effort is normal. No respiratory distress.  Abdominal:     General: There is no distension.     Palpations: Abdomen is  soft.  Musculoskeletal:        General: Normal range of motion.     Cervical back: Normal range of motion and neck supple.   Skin:    Findings: No erythema.  Neurological:     Mental Status: She is alert and oriented to person, place, and time. Mental status is at baseline.     Cranial Nerves: No cranial nerve deficit.     Motor: No abnormal muscle tone.  Psychiatric:        Mood and Affect: Mood and affect normal.     LABORATORY DATA:  I have reviewed the data as listed    Latest Ref Rng & Units 07/16/2022    2:20 PM 07/01/2022    2:24 PM 04/02/2022   12:05 PM  CBC  WBC 4.0 - 10.5 Michele/uL 4.8  7.0  6.8   Hemoglobin 12.0 - 15.0 g/dL 15.8  14.5  14.6   Hematocrit 36.0 - 46.0 % 49.1  43.1  43.7   Platelets 150 - 400 Michele/uL 180  204  184       Latest Ref Rng & Units 04/02/2022   12:05 PM 01/17/2022    7:44 AM 01/16/2021    8:33 AM  CMP  Glucose 70 - 99 mg/dL 103  95  95   BUN 8 - 23 mg/dL 18  17  16   $ Creatinine 0.44 - 1.00 mg/dL 0.97  0.82  0.78   Sodium 135 - 145 mmol/L 138  140  139   Potassium 3.5 - 5.1 mmol/L 4.4  4.6  4.5   Chloride 98 - 111 mmol/L 104  105  105   CO2 22 - 32 mmol/L 27  28  28   $ Calcium 8.9 - 10.3 mg/dL 9.4  9.3  9.2   Total Protein 6.5 - 8.1 g/dL 7.1  6.4  6.4   Total Bilirubin 0.3 - 1.2 mg/dL 0.8  0.7  0.7   Alkaline Phos 38 - 126 U/L 68  71  59   AST 15 - 41 U/L 22  18  22   $ ALT 0 - 44 U/L 22  14  17      $ RADIOGRAPHIC STUDIES: I have personally reviewed the radiological images as listed and agreed with the findings in the report. No results found.

## 2022-08-28 ENCOUNTER — Encounter: Payer: Self-pay | Admitting: Radiation Oncology

## 2022-08-28 ENCOUNTER — Ambulatory Visit
Admission: RE | Admit: 2022-08-28 | Discharge: 2022-08-28 | Disposition: A | Discharge status: Home / Self Care | Payer: Medicare PPO | Source: Ambulatory Visit | Attending: Radiation Oncology | Admitting: Radiation Oncology

## 2022-08-28 VITALS — BP 117/67 | HR 75 | Temp 98.3°F | Resp 16 | Ht 66.5 in | Wt 219.5 lb

## 2022-08-28 DIAGNOSIS — D0511 Intraductal carcinoma in situ of right breast: Secondary | ICD-10-CM | POA: Insufficient documentation

## 2022-08-28 NOTE — Progress Notes (Signed)
Radiation Oncology Follow up Note  Name: Michele Cruz   Date:   08/28/2022 MRN:  [id number] DOB: 04-10-1956    This 67 y.o. female presents to the clinic today for 1 month follow-up status post whole breast radiation to her right breast for ER positive ductal carcinoma in situ.  REFERRING PROVIDER: Tower, Wynelle Fanny, MD  HPI: Patient is a 67 year old female now out 1 month having completed whole breast radiation to her right breast for ER positive ductal carcinoma in situ.  Seen today in routine follow-up she is doing well.  She specifically denies breast tenderness cough or bone pain.  She has been started on.  Arimidex may be having some slight muscle soreness from that.  COMPLICATIONS OF TREATMENT: none  FOLLOW UP COMPLIANCE: keeps appointments   PHYSICAL EXAM:  BP 117/67   Pulse 75   Temp 98.3 F (36.8 C)   Resp 16   Ht 5' 6.5" (1.689 m)   Wt 219 lb 8 oz (99.6 kg)   BMI 34.90 kg/m  Lungs are clear to A&P cardiac examination essentially unremarkable with regular rate and rhythm. No dominant mass or nodularity is noted in either breast in 2 positions examined. Incision is well-healed. No axillary or supraclavicular adenopathy is appreciated. Cosmetic result is excellent.  Well-developed well-nourished patient in NAD. HEENT reveals PERLA, EOMI, discs not visualized.  Oral cavity is clear. No oral mucosal lesions are identified. Neck is clear without evidence of cervical or supraclavicular adenopathy. Lungs are clear to A&P. Cardiac examination is essentially unremarkable with regular rate and rhythm without murmur rub or thrill. Abdomen is benign with no organomegaly or masses noted. Motor sensory and DTR levels are equal and symmetric in the upper and lower extremities. Cranial nerves II through XII are grossly intact. Proprioception is intact. No peripheral adenopathy or edema is identified. No motor or sensory levels are noted. Crude visual fields are within normal range.  RADIOLOGY  RESULTS: No current for review  PLAN: Present time is doing well 1 month out from whole breast radiation.  And pleased with her overall progress.  I have asked to see her back in 6 months for follow-up.  Patient is to call sooner with any concerns.  She continues on Arimidex.  I would like to take this opportunity to thank you for allowing me to participate in the care of your patient.Noreene Filbert, MD

## 2022-09-22 DIAGNOSIS — M9901 Segmental and somatic dysfunction of cervical region: Secondary | ICD-10-CM | POA: Diagnosis not present

## 2022-09-22 DIAGNOSIS — M7918 Myalgia, other site: Secondary | ICD-10-CM | POA: Diagnosis not present

## 2022-09-22 DIAGNOSIS — M5413 Radiculopathy, cervicothoracic region: Secondary | ICD-10-CM | POA: Diagnosis not present

## 2022-09-22 DIAGNOSIS — M25512 Pain in left shoulder: Secondary | ICD-10-CM | POA: Diagnosis not present

## 2022-10-13 ENCOUNTER — Ambulatory Visit
Admission: RE | Admit: 2022-10-13 | Discharge: 2022-10-13 | Disposition: A | Discharge status: Home / Self Care | Payer: Medicare PPO | Source: Ambulatory Visit | Attending: Oncology | Admitting: Oncology

## 2022-10-13 DIAGNOSIS — D0511 Intraductal carcinoma in situ of right breast: Secondary | ICD-10-CM | POA: Insufficient documentation

## 2022-10-13 DIAGNOSIS — Z780 Asymptomatic menopausal state: Secondary | ICD-10-CM | POA: Diagnosis not present

## 2022-10-13 DIAGNOSIS — E559 Vitamin D deficiency, unspecified: Secondary | ICD-10-CM | POA: Diagnosis not present

## 2022-11-12 ENCOUNTER — Encounter: Payer: Self-pay | Admitting: Oncology

## 2022-11-12 ENCOUNTER — Inpatient Hospital Stay (HOSPITAL_BASED_OUTPATIENT_CLINIC_OR_DEPARTMENT_OTHER): Payer: Medicare PPO | Admitting: Oncology

## 2022-11-12 ENCOUNTER — Inpatient Hospital Stay: Payer: Medicare PPO | Attending: Oncology

## 2022-11-12 VITALS — BP 122/64 | HR 67 | Temp 97.3°F | Resp 18 | Wt 219.5 lb

## 2022-11-12 DIAGNOSIS — Z86010 Personal history of colonic polyps: Secondary | ICD-10-CM | POA: Insufficient documentation

## 2022-11-12 DIAGNOSIS — Z8616 Personal history of COVID-19: Secondary | ICD-10-CM | POA: Diagnosis not present

## 2022-11-12 DIAGNOSIS — D0511 Intraductal carcinoma in situ of right breast: Secondary | ICD-10-CM | POA: Diagnosis not present

## 2022-11-12 DIAGNOSIS — R232 Flushing: Secondary | ICD-10-CM | POA: Diagnosis not present

## 2022-11-12 DIAGNOSIS — Z79811 Long term (current) use of aromatase inhibitors: Secondary | ICD-10-CM | POA: Diagnosis not present

## 2022-11-12 DIAGNOSIS — M791 Myalgia, unspecified site: Secondary | ICD-10-CM | POA: Insufficient documentation

## 2022-11-12 DIAGNOSIS — Z170 Estrogen receptor positive status [ER+]: Secondary | ICD-10-CM | POA: Insufficient documentation

## 2022-11-12 DIAGNOSIS — J45909 Unspecified asthma, uncomplicated: Secondary | ICD-10-CM | POA: Diagnosis not present

## 2022-11-12 DIAGNOSIS — Z8614 Personal history of Methicillin resistant Staphylococcus aureus infection: Secondary | ICD-10-CM | POA: Diagnosis not present

## 2022-11-12 DIAGNOSIS — Z923 Personal history of irradiation: Secondary | ICD-10-CM | POA: Insufficient documentation

## 2022-11-12 DIAGNOSIS — Z8719 Personal history of other diseases of the digestive system: Secondary | ICD-10-CM | POA: Diagnosis not present

## 2022-11-12 DIAGNOSIS — E785 Hyperlipidemia, unspecified: Secondary | ICD-10-CM | POA: Diagnosis not present

## 2022-11-12 LAB — CMP (CANCER CENTER ONLY)
ALT: 38 U/L (ref 0–44)
AST: 41 U/L (ref 15–41)
Albumin: 4 g/dL (ref 3.5–5.0)
Alkaline Phosphatase: 65 U/L (ref 38–126)
Anion gap: 8 (ref 5–15)
BUN: 25 mg/dL — ABNORMAL HIGH (ref 8–23)
CO2: 26 mmol/L (ref 22–32)
Calcium: 9.3 mg/dL (ref 8.9–10.3)
Chloride: 105 mmol/L (ref 98–111)
Creatinine: 0.8 mg/dL (ref 0.44–1.00)
GFR, Estimated: >60 mL/min (ref >60)
Glucose, Bld: 105 mg/dL — ABNORMAL HIGH (ref 70–99)
Potassium: 4.5 mmol/L (ref 3.5–5.1)
Sodium: 139 mmol/L (ref 135–145)
Total Bilirubin: 0.8 mg/dL (ref 0.3–1.2)
Total Protein: 7 g/dL (ref 6.5–8.1)

## 2022-11-12 LAB — CBC WITH DIFFERENTIAL (CANCER CENTER ONLY)
Abs Immature Granulocytes: 0.01 10*3/uL (ref 0.00–0.07)
Basophils Absolute: 0.1 10*3/uL (ref 0.0–0.1)
Basophils Relative: 1 %
Eosinophils Absolute: 0.3 10*3/uL (ref 0.0–0.5)
Eosinophils Relative: 6 %
HCT: 44 % (ref 36.0–46.0)
Hemoglobin: 14.5 g/dL (ref 12.0–15.0)
Immature Granulocytes: 0 %
Lymphocytes Relative: 30 %
Lymphs Abs: 1.4 10*3/uL (ref 0.7–4.0)
MCH: 29.1 pg (ref 26.0–34.0)
MCHC: 33 g/dL (ref 30.0–36.0)
MCV: 88.2 fL (ref 80.0–100.0)
Monocytes Absolute: 0.4 10*3/uL (ref 0.1–1.0)
Monocytes Relative: 9 %
Neutro Abs: 2.6 10*3/uL (ref 1.7–7.7)
Neutrophils Relative %: 54 %
Platelet Count: 174 10*3/uL (ref 150–400)
RBC: 4.99 MIL/uL (ref 3.87–5.11)
RDW: 13.3 % (ref 11.5–15.5)
WBC Count: 4.8 10*3/uL (ref 4.0–10.5)
nRBC: 0 % (ref 0.0–0.2)

## 2022-11-12 MED ORDER — ANASTROZOLE 1 MG PO TABS: 1.0000 mg | ORAL_TABLET | Freq: Every day | ORAL | 0 refills | Status: DC

## 2022-11-12 MED ORDER — ANASTROZOLE 1 MG PO TABS: 1.0000 mg | ORAL_TABLET | Freq: Every day | ORAL | 1 refills | Status: DC

## 2022-11-12 NOTE — Assessment & Plan Note (Signed)
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 S/p adjuvant RT.  Declines genetic testing.  Patient tolerates Arimidex 1 mg daily.  Recommend patient to continue. Labs are reviewed and discussed with patient. Recommend annual bilateral mammogram-will obtain in August 2024.

## 2022-11-12 NOTE — Progress Notes (Signed)
Hematology/Oncology Consult Note Telephone:(336) [phone] Fax:(336) [phone]     CHIEF COMPLAINTS/PURPOSE OF CONSULTATION:  Right breast DCIS  ASSESSMENT & PLAN:   Cancer Staging  DCIS (ductal carcinoma in situ) Staging form: Breast, AJCC 8th Edition - Clinical stage from 04/02/2022: Stage 0 (cTis (DCIS), cN0, cM0, ER+, PR+, HER2: Not Assessed) - Signed by Rickard Patience, MD on 04/02/2022   Aromatase inhibitor use Recommend calcium 1200mg  daily and Vitamin D supplementation.  10/13/2022, DEXA showed normal bone density.  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 S/p adjuvant RT.  Declines genetic testing.  Patient tolerates Arimidex 1 mg daily.  Recommend patient to continue. Labs are reviewed and discussed with patient. Recommend annual bilateral mammogram-will obtain in August 2024.  Orders Placed This Encounter  Procedures   MM 3D DIAGNOSTIC MAMMOGRAM BILATERAL BREAST    Standing Status:   Future    Standing Expiration Date:   11/12/2023    Order Specific Question:   Reason for Exam (SYMPTOM  OR DIAGNOSIS REQUIRED)    Answer:   DCIS right breast    Order Specific Question:   Preferred imaging location?    Answer:   [facility]   [place] LIMITED ULTRASOUND INCLUDING AXILLA LEFT BREAST     Standing Status:   Future    Standing Expiration Date:   11/12/2023    Order Specific Question:   Reason for Exam (SYMPTOM  OR DIAGNOSIS REQUIRED)    Answer:   DCIS R breast    Order Specific Question:   Preferred imaging location?    Answer:   [facility]   [place] LIMITED ULTRASOUND INCLUDING AXILLA RIGHT BREAST    Standing Status:   Future    Standing Expiration Date:   11/12/2023    Order Specific Question:   Reason for Exam (SYMPTOM  OR DIAGNOSIS REQUIRED)    Answer:   DCIS right breast    Order Specific Question:   Preferred imaging location?    Answer:   [facility]   CBC with Differential  (Cancer Center Only)    Standing Status:   Future    Standing Expiration Date:   11/12/2023   CMP (Cancer Center only)    Standing Status:   Future    Standing Expiration Date:   11/12/2023   Follow-up  6 months.  All questions were answered. The patient knows to call the clinic with any problems, questions or concerns.  Rickard Patience, MD, PhD [facility] Hematology Oncology 11/12/2022       HISTORY OF PRESENTING ILLNESS:  Michele Cruz 67 y.o. female presents to establish care for right breast DCIS I have reviewed her chart and materials related to her cancer extensively and collaborated history with the patient. Summary of oncologic history is as follows: Oncology History  DCIS (ductal carcinoma in situ)  02/10/2022 Imaging   Bilateral screening mammogram showed further evaluation suggested for calcifications in the right breast.   02/20/2022 Mammogram   Unilateral right diagnostic mammogram showed  Round and punctate calcifications are identified in the lateral central right breast accounting for the screening findings. These calcifications span up to 6 mm and do not layer.   04/01/2022 Initial Diagnosis   DCIS (ductal carcinoma in situ)  03/28/2022 right breast biopsy DCIS, intemediate to high grade, solid and cribriform types with focal necrosis and extensive cancerization of lobules.  ER+100%, PR+50%   Menarche at age of 13 First live birth at  age of 7 OCP use: 5 years Menopausal status: Postmenopausal History of HRT use: Denies History of chest radiation: Denies Number of previous breast biopsies: Denies  Patient denies any family history of breast cancer.   04/02/2022 Cancer Staging   Staging form: Breast, AJCC 8th Edition - Clinical stage from 04/02/2022: Stage 0 (cTis (DCIS), cN0, cM0, ER+, PR+, HER2: Not Assessed) - Signed by Rickard Patience, MD on 04/02/2022 Stage prefix: Initial diagnosis   05/07/2022 Surgery   Patient underwent right breast lumpectomy  Pathology  showed florid lobular neoplasm (lobular carcinoma in situ) with extensive ductal involvement and focal comedo type necrosis.  Negative for DCIS and invasive carcinoma.    Radiation Therapy     06/26/2022 - 07/25/2022 Radiation Therapy   S/p adjuvant breast radiation.     INTERVAL HISTORY Jamesa Mcafee is a 67 y.o. female who has above history reviewed by me today presents for follow up visit for history of right breast DCIS, status post lumpectomy followed by radiation. Patient currently takes Arimidex 1 mg daily.  She tolerates well with manageable hot flash, joint/muscle pain.  No new complaints.   MEDICAL HISTORY:  Past Medical History:  Diagnosis Date   Allergic rhinitis    Anemia, iron deficiency    Anxiety    Arthritis    Asthma    no symptoms last 25 years   Colon polyps    COVID-19 07/2020   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   Family history of adverse reaction to anesthesia    sister PONV   Heart murmur    Heavy menses    Hemorrhoids    Hidradenitis suppurativa    History of exercise stress test 06/1999   neg   History of methicillin resistant staphylococcus aureus (MRSA) 2019   + surgical pcr swab   History of MRI 2003   neg per patient   History of MRI 2007   right quad fatty defect   History of MRI of spine 03/2004   C-5, C5-6 disk protrusion   Hyperlipidemia    LDL   Rosacea    Spondylosis    deg lumbar disc dz   Wisdom teeth removed    Zoster     SURGICAL HISTORY: Past Surgical History:  Procedure Laterality Date   BREAST BIOPSY Right 03/28/2022   breast center in [place]   BREAST CYST ASPIRATION  03/2002   BREAST CYST ASPIRATION  03/2004   left   BREAST LUMPECTOMY WITH RADIOFREQUENCY TAG IDENTIFICATION Right 05/07/2022   Procedure: BREAST LUMPECTOMY WITH RADIOFREQUENCY TAG IDENTIFICATION;  Surgeon: Campbell Lerner, MD;  Location: [facility];  Service: General;  Laterality: Right;   CATARACT EXTRACTION Bilateral    CESAREAN  SECTION     COLONOSCOPY  01/26/2012   HEMORRHOID BANDING  08/2021   KNEE ARTHROPLASTY Left 03/15/2018   Procedure: COMPUTER ASSISTED TOTAL KNEE ARTHROPLASTY;  Surgeon: Donato Heinz, MD;  Location: [facility];  Service: Orthopedics;  Laterality: Left;   KNEE ARTHROPLASTY Right 07/28/2018   Procedure: COMPUTER ASSISTED TOTAL KNEE ARTHROPLASTY;  Surgeon: Donato Heinz, MD;  Location: [facility];  Service: Orthopedics;  Laterality: Right;   KNEE SURGERY Right     SOCIAL HISTORY: Social History   Socioeconomic History   Marital status: Married    Spouse name: Not on file   Number of children: 1   Years of education: Not on file   Highest education level: Not on file  Occupational History   Occupation:  INSTRUCTIONAL ASST    Comment: [employer]  Tobacco Use   Smoking status: Never   Smokeless tobacco: Never  Vaping Use   Vaping Use: Never used  Substance and Sexual Activity   Alcohol use: Yes    Alcohol/week: 4.0 standard drinks of alcohol    Types: 2 Glasses of wine, 2 Cans of beer per week    Comment: 2-4 glasses wine or beer weekly per pt.   Drug use: No   Sexual activity: Not on file  Other Topics Concern   Not on file  Social History Narrative   One daughter   Daily caffeine; use 1-2 daily   Social Determinants of Health   Financial Resource Strain: Not on file  Food Insecurity: Not on file  Transportation Needs: Not on file  Physical Activity: Not on file  Stress: Not on file  Social Connections: Not on file  Intimate Partner Violence: Not on file    FAMILY HISTORY: Family History  Problem Relation Age of Onset   Arthritis Mother        OA   Obesity Mother    Diverticulitis Mother    Colon polyps Mother    Heart disease Father        CAD   Alcohol abuse Father    Diabetes Father    Hyperlipidemia Sister    Diverticulitis Sister    Diabetes Other    Diverticulitis Sister    Colon cancer Neg Hx    Breast cancer Neg Hx    Esophageal cancer Neg  Hx    Rectal cancer Neg Hx    Stomach cancer Neg Hx     ALLERGIES:  is allergic to decadron [dexamethasone], crestor [rosuvastatin], depo-medrol [methylprednisolone acetate], lipitor [atorvastatin calcium], meloxicam, and simvastatin.  MEDICATIONS:  Current Outpatient Medications  Medication Sig Dispense Refill   Ascorbic Acid (VITAMIN C) 500 MG CHEW Chew 1 tablet by mouth daily at 6 (six) AM.     Calcium-Vitamin D 600-5 MG-MCG TABS Take 1 tablet by mouth daily.     Coenzyme Q10 (CO Q 10) 100 MG CAPS Take 1 capsule by mouth daily at 6 (six) AM.     COLLAGEN PO Take 1 Scoop by mouth daily. BioTRUST Multi-collagen, 10 g bioactive daily     fexofenadine (ALLEGRA) 180 MG tablet Take 180 mg by mouth every morning.     Magnesium 500 MG CAPS Take 1 capsule by mouth daily at 6 (six) AM.     Misc Natural Products (TURMERIC CURCUMIN) CAPS Take 1 capsule by mouth daily. Theracurmin     Multiple Vitamin (MULTIVITAMIN) tablet Take 1 tablet by mouth daily.     Omega-3 Fatty Acids (FISH OIL) 1000 MG CAPS Take 1 capsule by mouth daily.     anastrozole (ARIMIDEX) 1 MG tablet Take 1 tablet (1 mg total) by mouth daily. 90 tablet 1   No current facility-administered medications for this visit.    Review of Systems  Constitutional:  Negative for appetite change, chills, fatigue and fever.  HENT:   Negative for hearing loss and voice change.   Eyes:  Negative for eye problems.  Respiratory:  Negative for chest tightness and cough.   Cardiovascular:  Negative for chest pain.  Gastrointestinal:  Negative for abdominal distention, abdominal pain and blood in stool.  Endocrine: Negative for hot flashes.  Genitourinary:  Negative for difficulty urinating and frequency.   Musculoskeletal:  Negative for arthralgias.  Skin:  Negative for itching and rash.  Neurological:  Negative for extremity weakness.  Hematological:  Negative for adenopathy.  Psychiatric/Behavioral:  Negative for confusion.       PHYSICAL EXAMINATION: ECOG PERFORMANCE STATUS: 0 - Asymptomatic  Vitals:   11/12/22 1317  BP: 122/64  Pulse: 67  Resp: 18  Temp: (!) 97.3 F (36.3 C)  SpO2: 99%   Filed Weights   11/12/22 1317  Weight: 219 lb 8 oz (99.6 kg)    Physical Exam Constitutional:      General: She is not in acute distress.    Appearance: She is not diaphoretic.  HENT:     Head: Normocephalic and atraumatic.     Mouth/Throat:     Pharynx: No oropharyngeal exudate.  Eyes:     General: No scleral icterus. Cardiovascular:     Rate and Rhythm: Normal rate.  Pulmonary:     Effort: Pulmonary effort is normal. No respiratory distress.  Abdominal:     General: There is no distension.     Palpations: Abdomen is soft.  Musculoskeletal:        General: Normal range of motion.     Cervical back: Normal range of motion and neck supple.  Skin:    Findings: No erythema.  Neurological:     Mental Status: She is alert and oriented to person, place, and time. Mental status is at baseline.     Cranial Nerves: No cranial nerve deficit.     Motor: No abnormal muscle tone.  Psychiatric:        Mood and Affect: Mood and affect normal.     LABORATORY DATA:  I have reviewed the data as listed    Latest Ref Rng & Units 11/12/2022    1:08 PM 07/16/2022    2:20 PM 07/01/2022    2:24 PM  CBC  WBC 4.0 - 10.5 K/uL 4.8  4.8  7.0   Hemoglobin 12.0 - 15.0 g/dL 96.0  45.4  09.8   Hematocrit 36.0 - 46.0 % 44.0  49.1  43.1   Platelets 150 - 400 K/uL 174  180  204       Latest Ref Rng & Units 11/12/2022    1:08 PM 04/02/2022   12:05 PM 01/17/2022    7:44 AM  CMP  Glucose 70 - 99 mg/dL 119  147  95   BUN 8 - 23 mg/dL 25  18  17    Creatinine 0.44 - 1.00 mg/dL 8.29  5.62  1.30   Sodium 135 - 145 mmol/L 139  138  140   Potassium 3.5 - 5.1 mmol/L 4.5  4.4  4.6   Chloride 98 - 111 mmol/L 105  104  105   CO2 22 - 32 mmol/L 26  27  28    Calcium 8.9 - 10.3 mg/dL 9.3  9.4  9.3   Total Protein 6.5 - 8.1 g/dL 7.0   7.1  6.4   Total Bilirubin 0.3 - 1.2 mg/dL 0.8  0.8  0.7   Alkaline Phos 38 - 126 U/L 65  68  71   AST 15 - 41 U/L 41  22  18   ALT 0 - 44 U/L 38  22  14      RADIOGRAPHIC STUDIES: I have personally reviewed the radiological images as listed and agreed with the findings in the report. No results found.

## 2022-11-12 NOTE — Assessment & Plan Note (Addendum)
Recommend calcium 1200mg  daily and Vitamin D supplementation.  10/13/2022, DEXA showed normal bone density.

## 2022-11-12 NOTE — Progress Notes (Signed)
Survivorship Care Plan visit completed.  Treatment summary reviewed and given to patient.  ASCO answers booklet reviewed and given to patient.  CARE program and Cancer Transitions discussed with patient along with other resources cancer center offers to patients and caregivers.  Patient verbalized understanding.    

## 2022-11-18 ENCOUNTER — Other Ambulatory Visit: Payer: Self-pay

## 2022-11-28 ENCOUNTER — Ambulatory Visit: Payer: Medicare PPO | Admitting: Family Medicine

## 2022-11-28 ENCOUNTER — Encounter: Payer: Self-pay | Admitting: Family Medicine

## 2022-11-28 VITALS — BP 130/80 | HR 77 | Temp 97.6°F | Ht 66.5 in | Wt 217.2 lb

## 2022-11-28 DIAGNOSIS — H8113 Benign paroxysmal vertigo, bilateral: Secondary | ICD-10-CM

## 2022-11-28 DIAGNOSIS — H8110 Benign paroxysmal vertigo, unspecified ear: Secondary | ICD-10-CM | POA: Insufficient documentation

## 2022-11-28 MED ORDER — MECLIZINE HCL 25 MG PO TABS: 25.0000 mg | ORAL_TABLET | Freq: Three times a day (TID) | ORAL | 0 refills | Status: DC | PRN

## 2022-11-28 MED ORDER — FLUTICASONE PROPIONATE 50 MCG/ACT NA SUSP: 2.0000 | Freq: Every day | NASAL | 6 refills | Status: DC
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
## 2022-11-28 NOTE — Patient Instructions (Addendum)
Change position slowly   Use flonase daily for at least 2 weeks or longer if it is your allergy season   If symptoms worsen- let us know  Use meclizine if needed  Go to ER if severe   Update [place] if you don't continue to improve

## 2022-11-30 NOTE — Assessment & Plan Note (Signed)
Has had in the past, this episode brief and now very mild  Has seen ENT in past and done PT as well Had a recent airplane trip  Suspect periphaeral No addnl neuro changes or symptoms  Has allergies  Reassuring exam  Given meclizine to have on hand / not needing now  Encouraged use of steroid ns for congestion/ to keep ETs clear  Update if not starting to improve in a week or if worsening  Discussed signs and symptoms of CVA to watch for  Handout given  Warned not to move head quickly  If worse consider physical maneuvers/ PT

## 2023-01-18 ENCOUNTER — Telehealth: Payer: Self-pay | Admitting: Family Medicine

## 2023-01-18 DIAGNOSIS — L732 Hidradenitis suppurativa: Secondary | ICD-10-CM

## 2023-01-18 DIAGNOSIS — R7309 Other abnormal glucose: Secondary | ICD-10-CM

## 2023-01-18 DIAGNOSIS — Z0000 Encounter for general adult medical examination without abnormal findings: Secondary | ICD-10-CM

## 2023-01-18 DIAGNOSIS — E7800 Pure hypercholesterolemia, unspecified: Secondary | ICD-10-CM

## 2023-01-18 DIAGNOSIS — K644 Residual hemorrhoidal skin tags: Secondary | ICD-10-CM

## 2023-01-18 NOTE — Telephone Encounter (Signed)
-----   Message from Lovena Neighbours sent at 01/01/2023  1:43 PM EDT ----- Regarding: Labs for 7.29.24 Please put physical lab orders in future. Thank you, Denny Peon

## 2023-01-19 ENCOUNTER — Other Ambulatory Visit (INDEPENDENT_AMBULATORY_CARE_PROVIDER_SITE_OTHER): Payer: Medicare PPO

## 2023-01-19 DIAGNOSIS — E7800 Pure hypercholesterolemia, unspecified: Secondary | ICD-10-CM

## 2023-01-19 DIAGNOSIS — L732 Hidradenitis suppurativa: Secondary | ICD-10-CM | POA: Diagnosis not present

## 2023-01-19 DIAGNOSIS — R7309 Other abnormal glucose: Secondary | ICD-10-CM

## 2023-01-19 DIAGNOSIS — K644 Residual hemorrhoidal skin tags: Secondary | ICD-10-CM | POA: Diagnosis not present

## 2023-01-19 DIAGNOSIS — K648 Other hemorrhoids: Secondary | ICD-10-CM | POA: Diagnosis not present

## 2023-01-19 LAB — COMPREHENSIVE METABOLIC PANEL
ALT: 15 U/L (ref 0–35)
AST: 18 U/L (ref 0–37)
Albumin: 4.2 g/dL (ref 3.5–5.2)
Alkaline Phosphatase: 74 U/L (ref 39–117)
BUN: 24 mg/dL — ABNORMAL HIGH (ref 6–23)
CO2: 27 mEq/L (ref 19–32)
Calcium: 9.4 mg/dL (ref 8.4–10.5)
Chloride: 103 mEq/L (ref 96–112)
Creatinine, Ser: 0.96 mg/dL (ref 0.40–1.20)
GFR: 61.65 mL/min (ref >60.00)
Glucose, Bld: 100 mg/dL — ABNORMAL HIGH (ref 70–99)
Potassium: 4.3 mEq/L (ref 3.5–5.1)
Sodium: 139 mEq/L (ref 135–145)
Total Bilirubin: 0.9 mg/dL (ref 0.2–1.2)
Total Protein: 6.4 g/dL (ref 6.0–8.3)

## 2023-01-19 LAB — LIPID PANEL
Cholesterol: 228 mg/dL — ABNORMAL HIGH (ref 0–200)
HDL: 61 mg/dL (ref >39.00)
LDL Cholesterol: 152 mg/dL — ABNORMAL HIGH (ref 0–99)
NonHDL: 167.17
Total CHOL/HDL Ratio: 4
Triglycerides: 74 mg/dL (ref 0.0–149.0)
VLDL: 14.8 mg/dL (ref 0.0–40.0)

## 2023-01-19 LAB — CBC WITH DIFFERENTIAL/PLATELET
Basophils Absolute: 0 10*3/uL (ref 0.0–0.1)
Basophils Relative: 1.1 % (ref 0.0–3.0)
Eosinophils Absolute: 0.1 10*3/uL (ref 0.0–0.7)
Eosinophils Relative: 2.7 % (ref 0.0–5.0)
HCT: 43.4 % (ref 36.0–46.0)
Hemoglobin: 14.4 g/dL (ref 12.0–15.0)
Lymphocytes Relative: 31.7 % (ref 12.0–46.0)
Lymphs Abs: 1.4 10*3/uL (ref 0.7–4.0)
MCHC: 33.3 g/dL (ref 30.0–36.0)
MCV: 88.2 fl (ref 78.0–100.0)
Monocytes Absolute: 0.4 10*3/uL (ref 0.1–1.0)
Monocytes Relative: 8.1 % (ref 3.0–12.0)
Neutro Abs: 2.5 10*3/uL (ref 1.4–7.7)
Neutrophils Relative %: 56.4 % (ref 43.0–77.0)
Platelets: 197 10*3/uL (ref 150.0–400.0)
RBC: 4.93 Mil/uL (ref 3.87–5.11)
RDW: 14.6 % (ref 11.5–15.5)
WBC: 4.4 10*3/uL (ref 4.0–10.5)

## 2023-01-19 LAB — HEMOGLOBIN A1C: Hgb A1c MFr Bld: 5.3 % (ref 4.6–6.5)

## 2023-01-19 LAB — TSH: TSH: 4.36 u[IU]/mL (ref 0.35–5.50)

## 2023-01-26 ENCOUNTER — Encounter: Payer: Self-pay | Admitting: Family Medicine

## 2023-01-26 ENCOUNTER — Ambulatory Visit (INDEPENDENT_AMBULATORY_CARE_PROVIDER_SITE_OTHER): Payer: Medicare PPO | Admitting: Family Medicine

## 2023-01-26 VITALS — BP 116/74 | HR 72 | Temp 97.7°F | Ht 66.25 in | Wt 214.0 lb

## 2023-01-26 DIAGNOSIS — Z23 Encounter for immunization: Secondary | ICD-10-CM | POA: Diagnosis not present

## 2023-01-26 DIAGNOSIS — E669 Obesity, unspecified: Secondary | ICD-10-CM

## 2023-01-26 DIAGNOSIS — R7309 Other abnormal glucose: Secondary | ICD-10-CM

## 2023-01-26 DIAGNOSIS — E7800 Pure hypercholesterolemia, unspecified: Secondary | ICD-10-CM

## 2023-01-26 DIAGNOSIS — Z0000 Encounter for general adult medical examination without abnormal findings: Secondary | ICD-10-CM | POA: Diagnosis not present

## 2023-01-26 NOTE — Assessment & Plan Note (Signed)
Reviewed health habits including diet and exercise and skin cancer prevention Reviewed appropriate screening tests for age  Also reviewed health mt list, fam hx and immunization status , as well as social and family history   See HPI Labs reviewed and ordered Prevnar 20 vaccine today  Plans flu sho tin the fall  Mammogram scheduled this month (personal history of breast cancer)  Colonoscopy utd 02/2019 with 7 y recall  Dexa utd 09/2022 -normal (is on arimidex) , no falls or fracture and good exercise  PHQ 5 = blames lack of energy from breast ca treatment and pt denies being depressed

## 2023-01-26 NOTE — Patient Instructions (Addendum)
Keep up the exercise for bone and overal health   Look at the handout on zetia for cholesterol = and let us know if you want to try it    Try eat a healthy diet   Get your mammogram as planned    Pneumonia shot today -prevnar 20

## 2023-01-26 NOTE — Assessment & Plan Note (Signed)
Discussed how this problem influences overall health and the risks it imposes  Reviewed plan for weight loss with lower calorie diet (via better food choices (lower glycemic and portion control) along with exercise building up to or more than 30 minutes 5 days per week including some aerobic activity and strength training    Weight loss will be more difficult with aromatase inhibitor

## 2023-01-26 NOTE — Assessment & Plan Note (Signed)
Disc goals for lipids and reasons to control them Rev last labs with pt Rev low sat fat diet in detail LDL up to 152 Did not tolerate low dose intermittent statin  Discussed zetia as an option and given handout  Pt will call or message if she wants to try it

## 2023-01-26 NOTE — Progress Notes (Signed)
Subjective:    Patient ID: Michele Cruz, female    DOB: [birth year], 67 y.o.   MRN: [id number]  HPI  Here for health maintenance exam and to review chronic medical problems   Wt Readings from Last 3 Encounters:  01/26/23 214 lb (97.1 kg)  11/28/22 217 lb 4 oz (98.5 kg)  11/12/22 219 lb 8 oz (99.6 kg)   34.28 kg/m  Vitals:   01/26/23 0901  BP: 116/74  Pulse: 72  Temp: 97.7 F (36.5 C)  SpO2: 98%    Immunization History  Administered Date(s) Administered   Fluad Quad(high Dose 65+) 02/21/2022   Influenza Inj Mdck Quad Pf 04/05/2020, 04/05/2021   Influenza Split 03/27/2011   Influenza Whole 06/23/2005, 03/24/2007, 04/02/2010   Influenza, Quadrivalent, Recombinant, Inj, Pf 03/23/2017   Influenza,inj,Quad PF,6+ Mos 04/15/2015, 03/30/2018, 01/28/2019   Influenza-Unspecified 03/23/2013   PFIZER Comirnaty(Gray Top)Covid-19 Tri-Sucrose Vaccine 09/28/2020   PFIZER(Purple Top)SARS-COV-2 Vaccination 09/19/2019, 10/12/2019   PNEUMOCOCCAL CONJUGATE-20 01/26/2023   Pneumococcal Polysaccharide-23 07/30/2018, 07/28/2021   Respiratory Syncytial Virus Vaccine,Recomb Aduvanted(Arexvy) 05/21/2022   Td 04/15/2002, 01/24/2020   Tdap 05/21/2022   Zoster Recombinant(Shingrix) 01/28/2019, 04/25/2019     Health Maintenance Due  Topic Date Due   Medicare Annual Wellness (AWV)  01/25/2023   Doing well  Taking care of herself   Pna vaccine -had pna 23 vaccine in 07/2021  Flu shot - gets in the fall   Has had shingrix and RSV vaccine     Mammogram- had 01/2022- pending next- was dx with DCIS and treated  Is scheduled this month  Takes aromatase inhibitor -watching bone density Is doing well /=hates the medicine however  Self breast exam: no changes   No gyn issues    Colon cancer screening  colonoscopy 02/2019 with 7 y recall   Dexa  09/2022 -normal bmd  Falls- none Fractures-none Supplements - D and ca  Exercise - 3-4 days per week weight training  [facility] -  for [facility] cardio    Mood    01/26/2023    9:07 AM 11/28/2022    8:35 AM 01/23/2021   10:34 AM 01/24/2020    4:06 PM 01/19/2019    9:11 AM  Depression screen PHQ 2/9  Decreased Interest 1 1 1 1 1   Down, Depressed, Hopeless 0 0 1 1 0  PHQ - 2 Score 1 1 2 2 1   Altered sleeping 0 1 1 0 1  Tired, decreased energy 1 1 2 1 1   Change in appetite 1 1 1 1 1   Feeling bad or failure about yourself  0 1 1 0 1  Trouble concentrating 0 0 1 0 0  Moving slowly or fidgety/restless 0 0 0 0 0  Suicidal thoughts 0 0 0 0 0  PHQ-9 Score 3 5 8 4 5   Difficult doing work/chores Somewhat difficult Somewhat difficult Somewhat difficult     In setting of breast cancer treatment - lack of energy from arimidex influences this  Does not feel depressed    Hyperlipidemia  Lab Results  Component Value Date   CHOL 228 (H) 01/19/2023   CHOL 198 01/17/2022   CHOL 180 01/16/2021   Lab Results  Component Value Date   HDL 61.00 01/19/2023   HDL 59.60 01/17/2022   HDL 63.40 01/16/2021   Lab Results  Component Value Date   LDLCALC 152 (H) 01/19/2023   LDLCALC 126 (H) 01/17/2022   LDLCALC 100 (H) 01/16/2021   Lab Results  Component Value  Date   TRIG 74.0 01/19/2023   TRIG 65.0 01/17/2022   TRIG 83.0 01/16/2021   Lab Results  Component Value Date   CHOLHDL 4 01/19/2023   CHOLHDL 3 01/17/2022   CHOLHDL 3 01/16/2021   Lab Results  Component Value Date   LDLDIRECT 152.4 03/27/2011   LDLDIRECT 150.6 03/20/2010   LDLDIRECT 150.4 12/11/2009   Did not tolerate low dose intermittent statin   Diet has been pretty good / very aware of it  No fast food or fried food  Red meat occationally -not often     Glucose Lab Results  Component Value Date   HGBA1C 5.3 01/19/2023   Lab Results  Component Value Date   NA 139 01/19/2023   K 4.3 01/19/2023   CO2 27 01/19/2023   GLUCOSE 100 (H) 01/19/2023   BUN 24 (H) 01/19/2023   CREATININE 0.96 01/19/2023   CALCIUM 9.4 01/19/2023   GFR 61.65 01/19/2023    GFRNONAA >60 11/12/2022   Lab Results  Component Value Date   ALT 15 01/19/2023   AST 18 01/19/2023   ALKPHOS 74 01/19/2023   BILITOT 0.9 01/19/2023    Lab Results  Component Value Date   WBC 4.4 01/19/2023   HGB 14.4 01/19/2023   HCT 43.4 01/19/2023   MCV 88.2 01/19/2023   PLT 197.0 01/19/2023   Lab Results  Component Value Date   TSH 4.36 01/19/2023       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   Encounter for screening mammogram for breast cancer 01/24/2022   Estrogen deficiency 01/24/2022   Internal and external bleeding hemorrhoids 08/16/2021   Total knee replacement status 07/28/2018   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   Internal hemorrhoids 07/21/2017   Hydradenitis 07/21/2017   Obesity (BMI 30-39.9) 12/09/2016   Encounter for routine gynecological examination 01/10/2014   Routine general medical examination at a health care facility 03/25/2011   HYPERCHOLESTEROLEMIA 12/11/2009   COLONIC POLYPS, HX OF 08/03/2009   HERNIATED CERVICAL DISC 06/20/2009   Herniated cervical disc 06/20/2009   BACK PAIN, LUMBAR, WITH RADICULOPATHY 10/13/2008   Generalized anxiety disorder 05/24/2007   ALLERGIC RHINITIS 05/24/2007   ROSACEA 05/24/2007   CARDIAC MURMUR 05/24/2007   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   Family history of adverse reaction to anesthesia    sister PONV   Heart murmur    Heavy menses    Hemorrhoids    Hidradenitis suppurativa    History of exercise stress test  06/1999   neg   History of methicillin resistant staphylococcus aureus (MRSA) 2019   + surgical pcr swab   History of MRI 2003   neg per patient   History of MRI 2007   right quad fatty defect   History of MRI of spine 03/2004   C-5, C5-6 disk protrusion   Hyperlipidemia    LDL   Rosacea    Spondylosis  deg lumbar disc dz   Wisdom teeth removed    Zoster    Past Surgical History:  Procedure Laterality Date   BREAST BIOPSY Right 03/28/2022   bre[facility] center in [place]   BREAST CYST ASPIRATION  03/2002   BREAST CYST ASPIRATION  03/2004   left   BREAST LUMPECTOMY WITH RADIOFREQUENCY TAG IDENTIFICATION Right 05/07/2022   Procedure: BREAST LUMPECTOMY WITH RADIOFREQUENCY TAG IDENTIFICATION;  Surgeon: Campbell Lerner, MD;  Location: [facility];  Service: General;  Laterality: Right;   CATARACT EXTRACTION Bilateral    CESAREAN SECTION     COLONOSCOPY  01/26/2012   EYE SURGERY  7/12&26/2021   Cataracts   HEMORRHOID BANDING  08/2021   JOINT REPLACEMENT  03/15/2018 & 07/28/2018   TKR left & right   KNEE ARTHROPLASTY Left 03/15/2018   Procedure: COMPUTER ASSISTED TOTAL KNEE ARTHROPLASTY;  Surgeon: Donato Heinz, MD;  Location: [facility];  Service: Orthopedics;  Laterality: Left;   KNEE ARTHROPLASTY Right 07/28/2018   Procedure: COMPUTER ASSISTED TOTAL KNEE ARTHROPLASTY;  Surgeon: Donato Heinz, MD;  Location: [facility];  Service: Orthopedics;  Laterality: Right;   KNEE SURGERY Right    Social History   Tobacco Use   Smoking status: Never   Smokeless tobacco: Never  Vaping Use   Vaping status: Never Used  Substance Use Topics   Alcohol use: Yes    Alcohol/week: 4.0 standard drinks of alcohol    Comment: 1-2 drinks/month   Drug use: No   Family History  Problem Relation Age of Onset   Arthritis Mother        OA   Obesity Mother    Diverticulitis Mother    Colon polyps Mother    Hypertension Mother    Heart disease Father        CAD   Alcohol abuse Father     Diabetes Father    Hyperlipidemia Sister    Diverticulitis Sister    Diabetes Other    Diverticulitis Sister    Diabetes Maternal Grandmother    Hyperlipidemia Paternal Grandmother    Asthma Sister    Hyperlipidemia Sister    Colon cancer Neg Hx    Breast cancer Neg Hx    Esophageal cancer Neg Hx    Rectal cancer Neg Hx    Stomach cancer Neg Hx    Allergies  Allergen Reactions   Decadron [Dexamethasone] Other (See Comments)    Unsure if related---swelling of throat   Crestor [Rosuvastatin]     Muscle pain    Depo-Medrol [Methylprednisolone Acetate]     Rash after injection, a noted common SE from depo-medrol listed in epocrates and most resources   Lipitor [Atorvastatin Calcium] Other (See Comments)    Leg cramps and pain   Meloxicam     Pt felt dizzy potentially from this, but does well on aleve   Simvastatin     Possible transient memory changes, resolved off medicine. Muscle pain   Current Outpatient Medications on File Prior to Visit  Medication Sig Dispense Refill   anastrozole (ARIMIDEX) 1 MG tablet Take 1 tablet (1 mg total) by mouth daily. 90 tablet 1   Ascorbic Acid (VITAMIN C) 500 MG CHEW Chew 1 tablet by mouth daily at 6 (six) AM.     Calcium-Vitamin D-Vitamin K 750-500-40 MG-UNT-MCG TABS Take 1 capsule by mouth daily.     Coenzyme Q10 (CO Q 10) 100 MG CAPS Take 1 capsule by mouth daily at 6 (six) AM.     COLLAGEN  PO Take 1 Scoop by mouth daily. BioTRUST Multi-collagen, 10 g bioactive daily     fluticasone (FLONASE) 50 MCG/ACT nasal spray Place 2 sprays into both nostrils daily. 16 g 6   Loratadine 10 MG CAPS Take 1 capsule by mouth daily.     Magnesium 500 MG CAPS Take 1 capsule by mouth daily at 6 (six) AM.     meclizine (ANTIVERT) 25 MG tablet Take 1 tablet (25 mg total) by mouth 3 (three) times daily as needed for dizziness. Caution of sedation 30 tablet 0   Misc Natural Products (TURMERIC CURCUMIN) CAPS Take 1 capsule by mouth daily. Theracurmin     Multiple  Vitamins-Minerals (CENTRUM SILVER 50+WOMEN) TABS Take 1 capsule by mouth daily.     NONFORMULARY OR COMPOUNDED ITEM Azelaic Acid/Metronidazole/Ivermectin     Omega-3 Fatty Acids (FISH OIL) 1000 MG CAPS Take 1 capsule by mouth daily.     No current facility-administered medications on file prior to visit.    Review of Systems  Constitutional:  Positive for fatigue. Negative for activity change, appetite change, fever and unexpected weight change.  HENT:  Negative for congestion, ear pain, rhinorrhea, sinus pressure and sore throat.   Eyes:  Negative for pain, redness and visual disturbance.  Respiratory:  Negative for cough, shortness of breath and wheezing.   Cardiovascular:  Negative for chest pain and palpitations.  Gastrointestinal:  Negative for abdominal pain, blood in stool, constipation and diarrhea.  Endocrine: Negative for polydipsia and polyuria.  Genitourinary:  Negative for dysuria, frequency and urgency.  Musculoskeletal:  Positive for arthralgias. Negative for back pain and myalgias.  Skin:  Negative for pallor and rash.  Allergic/Immunologic: Negative for environmental allergies.  Neurological:  Negative for dizziness, syncope and headaches.  Hematological:  Negative for adenopathy. Does not bruise/bleed easily.  Psychiatric/Behavioral:  Negative for decreased concentration and dysphoric mood. The patient is not nervous/anxious.        Objective:   Physical Exam Constitutional:      General: She is not in acute distress.    Appearance: Normal appearance. She is well-developed. She is obese. She is not ill-appearing or diaphoretic.  HENT:     Head: Normocephalic and atraumatic.     Right Ear: Tympanic membrane, ear canal and external ear normal.     Left Ear: Tympanic membrane, ear canal and external ear normal.     Nose: Nose normal. No congestion.     Mouth/Throat:     Mouth: Mucous membranes are moist.     Pharynx: Oropharynx is clear. No posterior oropharyngeal  erythema.  Eyes:     General: No scleral icterus.    Extraocular Movements: Extraocular movements intact.     Conjunctiva/sclera: Conjunctivae normal.     Pupils: Pupils are equal, round, and reactive to light.  Neck:     Thyroid: No thyromegaly.     Vascular: No carotid bruit or JVD.  Cardiovascular:     Rate and Rhythm: Normal rate and regular rhythm.     Pulses: Normal pulses.     Heart sounds: Normal heart sounds.     No gallop.  Pulmonary:     Effort: Pulmonary effort is normal. No respiratory distress.     Breath sounds: Normal breath sounds. No wheezing.     Comments: Good air exch Chest:     Chest wall: No tenderness.  Abdominal:     General: Bowel sounds are normal. There is no distension or abdominal bruit.     Palpations:  Abdomen is soft. There is no mass.     Tenderness: There is no abdominal tenderness.     Hernia: No hernia is present.  Genitourinary:    Comments: Breast exam: No mass, nodules, thickening, tenderness, bulging, retraction, inflamation, nipple discharge or skin changes noted.  No axillary or clavicular LA.     Surgical changes in right breast  Musculoskeletal:        General: No tenderness. Normal range of motion.     Cervical back: Normal range of motion and neck supple. No rigidity. No muscular tenderness.     Right lower leg: No edema.     Left lower leg: No edema.     Comments: No kyphosis   Lymphadenopathy:     Cervical: No cervical adenopathy.  Skin:    General: Skin is warm and dry.     Coloration: Skin is not pale.     Findings: No erythema or rash.  Neurological:     Mental Status: She is alert. Mental status is at baseline.     Cranial Nerves: No cranial nerve deficit.     Motor: No abnormal muscle tone.     Coordination: Coordination normal.     Gait: Gait normal.     Deep Tendon Reflexes: Reflexes are normal and symmetric. Reflexes normal.  Psychiatric:        Mood and Affect: Mood normal.        Cognition and Memory: Cognition  and memory normal.           Assessment & Plan:   Problem List Items Addressed This Visit       Other   Routine general medical examination at a health care facility - Primary    Reviewed health habits including diet and exercise and skin cancer prevention Reviewed appropriate screening tests for age  Also reviewed health mt list, fam hx and immunization status , as well as social and family history   See HPI Labs reviewed and ordered Prevnar 20 vaccine today  Plans flu sho tin the fall  Mammogram scheduled this month (personal history of breast cancer)  Colonoscopy utd 02/2019 with 7 y recall  Dexa utd 09/2022 -normal (is on arimidex) , no falls or fracture and good exercise  PHQ 5 = blames lack of energy from breast ca treatment and pt denies being depressed       Obesity (BMI 30-39.9)    Discussed how this problem influences overall health and the risks it imposes  Reviewed plan for weight loss with lower calorie diet (via better food choices (lower glycemic and portion control) along with exercise building up to or more than 30 minutes 5 days per week including some aerobic activity and strength training    Weight loss will be more difficult with aromatase inhibitor       HYPERCHOLESTEROLEMIA    Disc goals for lipids and reasons to control them Rev last labs with pt Rev low sat fat diet in detail LDL up to 152 Did not tolerate low dose intermittent statin  Discussed zetia as an option and given handout  Pt will call or message if she wants to try it        Elevated glucose level    Lab Results  Component Value Date   HGBA1C 5.3 01/19/2023   disc imp of low glycemic diet and wt loss to prevent DM2       Other Visit Diagnoses     Need for pneumococcal 20-valent conjugate  vaccination       Relevant Orders   Pneumococcal conjugate vaccine 20-valent (Prevnar 20) (Completed)

## 2023-01-26 NOTE — Assessment & Plan Note (Signed)
Lab Results  Component Value Date   HGBA1C 5.3 01/19/2023   disc imp of low glycemic diet and wt loss to prevent DM2

## 2023-01-29 ENCOUNTER — Ambulatory Visit (INDEPENDENT_AMBULATORY_CARE_PROVIDER_SITE_OTHER): Payer: Medicare PPO

## 2023-01-29 VITALS — Wt 214.0 lb

## 2023-01-29 DIAGNOSIS — Z0000 Encounter for general adult medical examination without abnormal findings: Secondary | ICD-10-CM | POA: Diagnosis not present

## 2023-01-29 NOTE — Progress Notes (Signed)
Subjective:   Michele Cruz is a 67 y.o. female who presents for Medicare Annual (Subsequent) preventive examination.  Visit Complete: Virtual  I connected with  Relia Keillor on 01/29/23 by a audio enabled telemedicine application and verified that I am speaking with the correct person using two identifiers.  Patient Location: Home  Provider Location: Office/Clinic  I discussed the limitations of evaluation and management by telemedicine. The patient expressed understanding and agreed to proceed.  Patient Medicare AWV questionnaire was completed by the patient on 01/28/23; I have confirmed that all information answered by patient is correct and no changes since this date.   Vital Signs: Unable to obtain new vitals due to this being a telehealth visit.   Review of Systems     Cardiac Risk Factors include: advanced age (>108men, >22 women);dyslipidemia;obesity (BMI >30kg/m2)     Objective:    Today's Vitals   01/29/23 0830  Weight: 214 lb (97.1 kg)   Body mass index is 34.28 kg/m.     01/29/2023    8:44 AM 08/28/2022    2:38 PM 06/12/2022    9:54 AM 05/26/2022    9:27 AM 04/30/2022    2:11 PM 04/17/2022    9:41 AM 04/02/2022   10:51 AM  Advanced Directives  Does Patient Have a Medical Advance Directive? Yes Yes Yes Yes Yes Yes Yes  Type of Estate agent of [place];Living will Healthcare Power of [place];Living will Healthcare Power of [place];Living will Healthcare Power of [place];Living will  Healthcare Power of [place];Living will Healthcare Power of [place];Living will  Does patient want to make changes to medical advance directive?  Yes (ED - Information included in AVS) No - Patient declined No - Patient declined  No - Patient declined No - Patient declined  Copy of Healthcare Power of Attorney in Chart? No - copy requested No - copy requested No - copy requested No - copy requested  No - copy requested No - copy requested    Current  Medications (verified) Outpatient Encounter Medications as of 01/29/2023  Medication Sig   anastrozole (ARIMIDEX) 1 MG tablet Take 1 tablet (1 mg total) by mouth daily.   Ascorbic Acid (VITAMIN C) 500 MG CHEW Chew 1 tablet by mouth daily at 6 (six) AM.   Calcium-Vitamin D-Vitamin K 750-500-40 MG-UNT-MCG TABS Take 1 capsule by mouth daily.   Coenzyme Q10 (CO Q 10) 100 MG CAPS Take 1 capsule by mouth daily at 6 (six) AM.   COLLAGEN PO Take 1 Scoop by mouth daily. BioTRUST Multi-collagen, 10 g bioactive daily   fluticasone (FLONASE) 50 MCG/ACT nasal spray Place 2 sprays into both nostrils daily.   Loratadine 10 MG CAPS Take 1 capsule by mouth daily.   Magnesium 500 MG CAPS Take 1 capsule by mouth daily at 6 (six) AM.   meclizine (ANTIVERT) 25 MG tablet Take 1 tablet (25 mg total) by mouth 3 (three) times daily as needed for dizziness. Caution of sedation   Misc Natural Products (TURMERIC CURCUMIN) CAPS Take 1 capsule by mouth daily. Theracurmin   Multiple Vitamins-Minerals (CENTRUM SILVER 50+WOMEN) TABS Take 1 capsule by mouth daily.   NONFORMULARY OR COMPOUNDED ITEM Azelaic Acid/Metronidazole/Ivermectin cream pump   Omega-3 Fatty Acids (FISH OIL) 1000 MG CAPS Take 1 capsule by mouth daily.   Povidone (IVIZIA DRY EYES OP) Apply to eye.   No facility-administered encounter medications on file as of 01/29/2023.    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   Family history of adverse reaction to anesthesia    sister PONV   Heart murmur    Heavy menses    Hemorrhoids     Hidradenitis suppurativa    History of exercise stress test 06/1999   neg   History of methicillin resistant staphylococcus aureus (MRSA) 2019   + surgical pcr swab   History of MRI 2003   neg per patient   History of MRI 2007   right quad fatty defect   History of MRI of spine 03/2004   C-5, C5-6 disk protrusion   Hyperlipidemia    LDL   Rosacea    Spondylosis    deg lumbar disc dz   Wisdom teeth removed    Zoster    Past Surgical History:  Procedure Laterality Date   BREAST BIOPSY Right 03/28/2022   breast center in [place]   BREAST CYST ASPIRATION  03/2002   BREAST CYST ASPIRATION  03/2004   left   BREAST LUMPECTOMY WITH RADIOFREQUENCY TAG IDENTIFICATION Right 05/07/2022   Procedure: BREAST LUMPECTOMY WITH RADIOFREQUENCY TAG IDENTIFICATION;  Surgeon: Campbell Lerner, MD;  Location: [facility] OR[facility];  Service: General;  Laterality: Right;   CATARACT EXTRACTION Bilateral    CESAREAN SECTION     COLONOSCOPY  01/26/2012   EYE SURGERY  7/12&26/2021   Cataracts   HEMORRHOID BANDING  08/2021   JOINT REPLACEMENT  03/15/2018 & 07/28/2018   TKR left & right   KNEE ARTHROPLASTY Left 03/15/2018   Procedure: COMPUTER ASSISTED TOTAL KNEE ARTHROPLASTY;  Surgeon: Donato Heinz, MD;  Location: [facility] OR[facility];  Service: Orthopedics;  Laterality: Left;   KNEE ARTHROPLASTY Right 07/28/2018   Procedure: COMPUTER ASSISTED TOTAL KNEE ARTHROPLASTY;  Surgeon: Donato Heinz, MD;  Location: [facility] OR[facility];  Service: Orthopedics;  Laterality: Right;   KNEE SURGERY Right    Family History  Problem Relation Age of Onset   Arthritis Mother        OA   Obesity Mother    Diverticulitis Mother    Colon polyps Mother    Hypertension Mother    Heart disease Father        CAD   Alcohol abuse Father    Diabetes Father    Hyperlipidemia Sister    Diverticulitis Sister    Diabetes Other    Diverticulitis Sister    Diabetes Maternal Grandmother    Hyperlipidemia Paternal Grandmother    Asthma Sister     Hyperlipidemia Sister    Colon cancer Neg Hx    Breast cancer Neg Hx    Esophageal cancer Neg Hx    Rectal cancer Neg Hx    Stomach cancer Neg Hx    Social History   Socioeconomic History   Marital status: Married    Spouse name: Not on file   Number of children: 1   Years of education: Not on file   Highest education level: Bachelor's degree (e.g., BA, AB, BS)  Occupational History   Occupation: INSTRUCTIONAL ASST    Comment: [employer]  Tobacco Use   Smoking status: Never  Smokeless tobacco: Never  Vaping Use   Vaping status: Never Used  Substance and Sexual Activity   Alcohol use: Yes    Alcohol/week: 4.0 standard drinks of alcohol    Comment: 1-2 drinks/month   Drug use: No   Sexual activity: Not Currently    Birth control/protection: Post-menopausal  Other Topics Concern   Not on file  Social History Narrative   One daughter   Daily caffeine; use 1-2 daily   Social Determinants of Health   Financial Resource Strain: Low Risk  (01/28/2023)   Overall Financial Resource Strain (CARDIA)    Difficulty of Paying Living Expenses: Not hard at all  Food Insecurity: No Food Insecurity (01/28/2023)   Hunger Vital Sign    Worried About Running Out of Food in the Last Year: Never true    Ran Out of Food in the Last Year: Never true  Transportation Needs: No Transportation Needs (01/28/2023)   PRAPARE - Administrator, Civil Service (Medical): No    Lack of Transportation (Non-Medical): No  Physical Activity: Sufficiently Active (01/28/2023)   Exercise Vital Sign    Days of Exercise per Week: 5 days    Minutes of Exercise per Session: 60 min  Stress: No Stress Concern Present (01/28/2023)   [employer] of Occupational Health - Occupational Stress Questionnaire    Feeling of Stress : Only a little  Social Connections: Moderately Isolated (01/28/2023)   Social Connection and Isolation Panel [NHANES]    Frequency of Communication with Friends and Family: Once  a week    Frequency of Social Gatherings with Friends and Family: More than three times a week    Attends Religious Services: Never    Database administrator or Organizations: No    Attends Engineer, structural: Patient declined    Marital Status: Married    Tobacco Counseling Counseling given: Not Answered   Clinical Intake:  Pre-visit preparation completed: Yes  Pain : No/denies pain     BMI - recorded: 34.28 Nutritional Status: BMI > 30  Obese Nutritional Risks: None Diabetes: No  How often do you need to have someone help you when you read instructions, pamphlets, or other written materials from your doctor or pharmacy?: 1 - Never  Interpreter Needed?: No  Information entered by :: Lanier Ensign, LPN   Activities of Daily Living    01/28/2023   10:48 AM 04/30/2022    2:03 PM  In your present state of health, do you have any difficulty performing the following activities:  Hearing? 0   Vision? 0   Difficulty concentrating or making decisions? 0   Walking or climbing stairs? 0   Dressing or bathing? 0   Doing errands, shopping? 0 0  Preparing Food and eating ? N   Using the Toilet? N   In the past six months, have you accidently leaked urine? N   Do you have problems with loss of bowel control? N   Managing your Medications? N   Managing your Finances? N   Housekeeping or managing your Housekeeping? N     Patient Care Team: Tower, Audrie Gallus, MD as PCP - General Hulen Luster, RN as Oncology Nurse Navigator Rickard Patience, MD as Consulting Physician (Oncology) Carmina Miller, MD as Consulting Physician (Radiation Oncology) Campbell Lerner, MD as Consulting Physician (General Surgery)  Indicate any recent Medical Services you may have received from other than [facility] providers in the past year (date may be approximate).  Assessment:   This is a routine wellness examination for Marcheta.  Hearing/Vision screen Hearing Screening - Comments:: Pt denies any  hearing issues  Vision Screening - Comments:: Pt follows up with  [facility] for annual eye exams   Dietary issues and exercise activities discussed:     Goals Addressed             This Visit's Progress    Patient Stated       Maintain weight        Depression Screen    01/29/2023    8:42 AM 01/26/2023    9:07 AM 11/28/2022    8:35 AM 01/23/2021   10:34 AM 01/24/2020    4:06 PM 01/19/2019    9:11 AM 01/12/2018    3:26 PM  PHQ 2/9 Scores  PHQ - 2 Score 0 1 1 2 2 1  0  PHQ- 9 Score 0 3 5 8 4 5      Fall Risk    01/28/2023   10:48 AM 01/26/2023    9:07 AM 11/28/2022    8:35 AM  Fall Risk   Falls in the past year? 0 0 0  Number falls in past yr:  0 0  Injury with Fall?  0 0  Risk for fall due to : Impaired vision No Fall Risks No Fall Risks  Follow up Falls prevention discussed Falls evaluation completed Falls evaluation completed    MEDICARE RISK AT HOME:   TIMED UP AND GO:  Was the test performed?  No    Cognitive Function:        01/29/2023    8:45 AM  6CIT Screen  What Year? 0 points  What month? 0 points  What time? 0 points  Count back from 20 0 points  Months in reverse 0 points  Repeat phrase 0 points  Total Score 0 points    Immunizations Immunization History  Administered Date(s) Administered   Fluad Quad(high Dose 65+) 02/21/2022   Influenza Inj Mdck Quad Pf 04/05/2020, 04/05/2021   Influenza Split 03/27/2011   Influenza Whole 06/23/2005, 03/24/2007, 04/02/2010   Influenza, Quadrivalent, Recombinant, Inj, Pf 03/23/2017   Influenza,inj,Quad PF,6+ Mos 04/15/2015, 03/30/2018, 01/28/2019   Influenza-Unspecified 03/23/2013   PFIZER Comirnaty(Gray Top)Covid-19 Tri-Sucrose Vaccine 09/28/2020   PFIZER(Purple Top)SARS-COV-2 Vaccination 09/19/2019, 10/12/2019   PNEUMOCOCCAL CONJUGATE-20 01/26/2023   Pneumococcal Polysaccharide-23 07/30/2018, 07/28/2021   Respiratory Syncytial Virus Vaccine,Recomb Aduvanted(Arexvy) 05/21/2022   Td 04/15/2002, 01/24/2020    Tdap 05/21/2022   Zoster Recombinant(Shingrix) 01/28/2019, 04/25/2019    TDAP status: Up to date  Flu Vaccine status: Due, Education has been provided regarding the importance of this vaccine. Advised may receive this vaccine at local pharmacy or Health Dept. Aware to provide a copy of the vaccination record if obtained from local pharmacy or Health Dept. Verbalized acceptance and understanding.  Pneumococcal vaccine status: Up to date  Covid-19 vaccine status: Completed vaccines  Qualifies for Shingles Vaccine? Yes   Zostavax completed Yes   Shingrix Completed?: Yes  Screening Tests Health Maintenance  Topic Date Due   COVID-19 Vaccine (4 - 2023-24 season) 02/11/2023 (Originally 02/21/2022)   Hepatitis C Screening  07/26/2023 (Originally 07/11/1973)   INFLUENZA VACCINE  09/21/2023 (Originally 01/22/2023)   Medicare Annual Wellness (AWV)  01/29/2024   MAMMOGRAM  02/21/2024   Colonoscopy  03/01/2026   DTaP/Tdap/Td (4 - Td or Tdap) 05/21/2032   Pneumonia Vaccine 69+ Years old  Completed   DEXA SCAN  Completed   Zoster Vaccines- Shingrix  Completed   HPV VACCINES  Aged Out    Health Maintenance  There are no preventive care reminders to display for this patient.   Colorectal cancer screening: Type of screening: Colonoscopy. Completed 03/02/19. Repeat every 7 years  Mammogram status: Ordered scheduled 02/09/23. Pt provided with contact info and advised to call to schedule appt.   Bone Density status: Completed 10/13/22. Results reflect: Bone density results: NORMAL. Repeat every 2 years.  Additional Screening:  Hepatitis C Screening: does not qualify; per pt   Vision Screening: Recommended annual ophthalmology exams for early detection of glaucoma and other disorders of the eye. Is the patient up to date with their annual eye exam?  Yes  Who is the provider or what is the name of the office in which the patient attends annual eye exams? [facility] If pt is not established with a  provider, would they like to be referred to a provider to establish care? No .   Dental Screening: Recommended annual dental exams for proper oral hygiene  Community Resource Referral / Chronic Care Management: CRR required this visit?  No   CCM required this visit?  No     Plan:     I have personally reviewed and noted the following in the patient's chart:   Medical and social history Use of alcohol, tobacco or illicit drugs  Current medications and supplements including opioid prescriptions. Patient is not currently taking opioid prescriptions. Functional ability and status Nutritional status Physical activity Advanced directives List of other physicians Hospitalizations, surgeries, and ER visits in previous 12 months Vitals Screenings to include cognitive, depression, and falls Referrals and appointments  In addition, I have reviewed and discussed with patient certain preventive protocols, quality metrics, and best practice recommendations. A written personalized care plan for preventive services as well as general preventive health recommendations were provided to patient.     Marzella Schlein, LPN   12/22/5364   After Visit Summary: (MyChart) Due to this being a telephonic visit, the after visit summary with patients personalized plan was offered to patient via MyChart   Nurse Notes: none

## 2023-01-29 NOTE — Patient Instructions (Signed)
[name] , Thank you for taking time to come for your Medicare Wellness Visit. I appreciate your ongoing commitment to your health goals. Please review the following plan we discussed and let me know if I can assist you in the future.   Referrals/Orders/Follow-Ups/Clinician Recommendations: maintain a healthy weight   This is a list of the screening recommended for you and due dates:  Health Maintenance  Topic Date Due   COVID-19 Vaccine (4 - 2023-24 season) 02/11/2023*   Hepatitis C Screening  07/26/2023*   Flu Shot  09/21/2023*   Medicare Annual Wellness Visit  01/29/2024   Mammogram  02/21/2024   Colon Cancer Screening  03/01/2026   DTaP/Tdap/Td vaccine (4 - Td or Tdap) 05/21/2032   Pneumonia Vaccine  Completed   DEXA scan (bone density measurement)  Completed   Zoster (Shingles) Vaccine  Completed   HPV Vaccine  Aged Out  *Topic was postponed. The date shown is not the original due date.    Advanced directives: (Copy Requested) Please bring a copy of your health care power of attorney and living will to the office to be added to your chart at your convenience.  Next Medicare Annual Wellness Visit scheduled for next year: Yes  Preventive Care 10 Years and Older, Female Preventive care refers to lifestyle choices and visits with your health care provider that can promote health and wellness. What does preventive care include? A yearly physical exam. This is also called an annual well check. Dental exams once or twice a year. Routine eye exams. Ask your health care provider how often you should have your eyes checked. Personal lifestyle choices, including: Daily care of your teeth and gums. Regular physical activity. Eating a healthy diet. Avoiding tobacco and drug use. Limiting alcohol use. Practicing safe sex. Taking low-dose aspirin every day. Taking vitamin and mineral supplements as recommended by your health care provider. What happens during an annual well  check? The services and screenings done by your health care provider during your annual well check will depend on your age, overall health, lifestyle risk factors, and family history of disease. Counseling  Your health care provider may ask you questions about your: Alcohol use. Tobacco use. Drug use. Emotional well-being. Home and relationship well-being. Sexual activity. Eating habits. History of falls. Memory and ability to understand (cognition). Work and work Astronomer. Reproductive health. Screening  You may have the following tests or measurements: Height, weight, and BMI. Blood pressure. Lipid and cholesterol levels. These may be checked every 5 years, or more frequently if you are over 84 years old. Skin check. Lung cancer screening. You may have this screening every year starting at age 56 if you have a 30-pack-year history of smoking and currently smoke or have quit within the past 15 years. Fecal occult blood test (FOBT) of the stool. You may have this test every year starting at age 2. Flexible sigmoidoscopy or colonoscopy. You may have a sigmoidoscopy every 5 years or a colonoscopy every 10 years starting at age 62. Hepatitis C blood test. Hepatitis B blood test. Sexually transmitted disease (STD) testing. Diabetes screening. This is done by checking your blood sugar (glucose) after you have not eaten for a while (fasting). You may have this done every 1-3 years. Bone density scan. This is done to screen for osteoporosis. You may have this done starting at age 84. Mammogram. This may be done every 1-2 years. Talk to your health care provider about how often you should have regular mammograms.  Talk with your health care provider about your test results, treatment options, and if necessary, the need for more tests. Vaccines  Your health care provider may recommend certain vaccines, such as: Influenza vaccine. This is recommended every year. Tetanus, diphtheria, and  acellular pertussis (Tdap, Td) vaccine. You may need a Td booster every 10 years. Zoster vaccine. You may need this after age 60. Pneumococcal 13-valent conjugate (PCV13) vaccine. One dose is recommended after age 78. Pneumococcal polysaccharide (PPSV23) vaccine. One dose is recommended after age 7. Talk to your health care provider about which screenings and vaccines you need and how often you need them. This information is not intended to replace advice given to you by your health care provider. Make sure you discuss any questions you have with your health care provider. Document Released: 07/06/2015 Document Revised: 02/27/2016 Document Reviewed: 04/10/2015 Elsevier Interactive Patient Education  2017 [employer].  Fall Prevention in the Home Falls can cause injuries. They can happen to people of all ages. There are many things you can do to make your home safe and to help prevent falls. What can I do on the outside of my home? Regularly fix the edges of walkways and driveways and fix any cracks. Remove anything that might make you trip as you walk through a door, such as a raised step or threshold. Trim any bushes or trees on the path to your home. Use bright outdoor lighting. Clear any walking paths of anything that might make someone trip, such as rocks or tools. Regularly check to see if handrails are loose or broken. Make sure that both sides of any steps have handrails. Any raised decks and porches should have guardrails on the edges. Have any leaves, snow, or ice cleared regularly. Use sand or salt on walking paths during winter. Clean up any spills in your garage right away. This includes oil or grease spills. What can I do in the bathroom? Use night lights. Install grab bars by the toilet and in the tub and shower. Do not use towel bars as grab bars. Use non-skid mats or decals in the tub or shower. If you need to sit down in the shower, use a plastic, non-slip stool. Keep  the floor dry. Clean up any water that spills on the floor as soon as it happens. Remove soap buildup in the tub or shower regularly. Attach bath mats securely with double-sided non-slip rug tape. Do not have throw rugs and other things on the floor that can make you trip. What can I do in the bedroom? Use night lights. Make sure that you have a light by your bed that is easy to reach. Do not use any sheets or blankets that are too big for your bed. They should not hang down onto the floor. Have a firm chair that has side arms. You can use this for support while you get dressed. Do not have throw rugs and other things on the floor that can make you trip. What can I do in the kitchen? Clean up any spills right away. Avoid walking on wet floors. Keep items that you use a lot in easy-to-reach places. If you need to reach something above you, use a strong step stool that has a grab bar. Keep electrical cords out of the way. Do not use floor polish or wax that makes floors slippery. If you must use wax, use non-skid floor wax. Do not have throw rugs and other things on the floor that can make  you trip. What can I do with my stairs? Do not leave any items on the stairs. Make sure that there are handrails on both sides of the stairs and use them. Fix handrails that are broken or loose. Make sure that handrails are as long as the stairways. Check any carpeting to make sure that it is firmly attached to the stairs. Fix any carpet that is loose or worn. Avoid having throw rugs at the top or bottom of the stairs. If you do have throw rugs, attach them to the floor with carpet tape. Make sure that you have a light switch at the top of the stairs and the bottom of the stairs. If you do not have them, ask someone to add them for you. What else can I do to help prevent falls? Wear shoes that: Do not have high heels. Have rubber bottoms. Are comfortable and fit you well. Are closed at the toe. Do not  wear sandals. If you use a stepladder: Make sure that it is fully opened. Do not climb a closed stepladder. Make sure that both sides of the stepladder are locked into place. Ask someone to hold it for you, if possible. Clearly mark and make sure that you can see: Any grab bars or handrails. First and last steps. Where the edge of each step is. Use tools that help you move around (mobility aids) if they are needed. These include: Canes. Walkers. Scooters. Crutches. Turn on the lights when you go into a dark area. Replace any light bulbs as soon as they burn out. Set up your furniture so you have a clear path. Avoid moving your furniture around. If any of your floors are uneven, fix them. If there are any pets around you, be aware of where they are. Review your medicines with your doctor. Some medicines can make you feel dizzy. This can increase your chance of falling. Ask your doctor what other things that you can do to help prevent falls. This information is not intended to replace advice given to you by your health care provider. Make sure you discuss any questions you have with your health care provider. Document Released: 04/05/2009 Document Revised: 11/15/2015 Document Reviewed: 07/14/2014 Elsevier Interactive Patient Education  2017 [employer].

## 2023-02-09 ENCOUNTER — Ambulatory Visit
Admission: RE | Admit: 2023-02-09 | Discharge: 2023-02-09 | Disposition: A | Discharge status: Home / Self Care | Payer: Medicare PPO | Source: Ambulatory Visit | Attending: Oncology | Admitting: Oncology

## 2023-02-09 DIAGNOSIS — D0511 Intraductal carcinoma in situ of right breast: Secondary | ICD-10-CM

## 2023-02-09 DIAGNOSIS — Z79811 Long term (current) use of aromatase inhibitors: Secondary | ICD-10-CM

## 2023-02-09 DIAGNOSIS — R921 Mammographic calcification found on diagnostic imaging of breast: Secondary | ICD-10-CM | POA: Diagnosis not present

## 2023-02-09 DIAGNOSIS — R92343 Mammographic extreme density, bilateral breasts: Secondary | ICD-10-CM | POA: Diagnosis not present

## 2023-02-17 ENCOUNTER — Ambulatory Visit: Payer: Medicare PPO | Admitting: Surgery

## 2023-02-17 ENCOUNTER — Encounter: Payer: Self-pay | Admitting: Surgery

## 2023-02-17 VITALS — BP 111/69 | HR 80 | Temp 98.0°F | Ht 66.25 in | Wt 218.0 lb

## 2023-02-17 DIAGNOSIS — Z09 Encounter for follow-up examination after completed treatment for conditions other than malignant neoplasm: Secondary | ICD-10-CM | POA: Diagnosis not present

## 2023-02-17 DIAGNOSIS — D0511 Intraductal carcinoma in situ of right breast: Secondary | ICD-10-CM

## 2023-02-17 NOTE — Patient Instructions (Addendum)
We will get you scheduled for a breast MRI in 6 months. No need for an exam afterwards. We will send you a letter about this scan.   The patient has been asked to return to the office in one year with a bilateral diagnostic mammogram.    Continue self breast exams. Call office for any new breast issues or concerns.   Breast Self-Awareness Breast self-awareness is knowing how your breasts look and feel. You need to: Check your breasts on a regular basis. Tell your doctor about any changes. Become familiar with the look and feel of your breasts. This can help you catch a breast problem while it is still small and can be treated. You should do breast self-exams even if you have breast implants. What you need: A mirror. A well-lit room. A pillow or other soft object. How to do a breast self-exam Follow these steps to do a breast self-exam: Look for changes  Take off all the clothes above your waist. Stand in front of a mirror in a room with good lighting. Put your hands down at your sides. Compare your breasts in the mirror. Look for any difference between them, such as: A difference in shape. A difference in size. Wrinkles, dips, and bumps in one breast and not the other. Look at each breast for changes in the skin, such as: Redness. Scaly areas. Skin that has gotten thicker. Dimpling. Open sores (ulcers). Look for changes in your nipples, such as: Fluid coming out of a nipple. Fluid around a nipple. Bleeding. Dimpling. Redness. A nipple that looks pushed in (retracted), or that has changed position. Feel for changes Lie on your back. Feel each breast. To do this: Pick a breast to feel. Place a pillow under the shoulder closest to that breast. Put the arm closest to that breast behind your head. Feel the nipple area of that breast using the hand of your other arm. Feel the area with the pads of your three middle fingers by making small circles with your fingers. Use light,  medium, and firm pressure. Continue the overlapping circles, moving downward over the breast. Keep making circles with your fingers. Stop when you feel your ribs. Start making circles with your fingers again, this time going upward until you reach your collarbone. Then, make circles outward across your breast and into your armpit area. Squeeze your nipple. Check for discharge and lumps. Repeat these steps to check your other breast. Sit or stand in the tub or shower. With soapy water on your skin, feel each breast the same way you did when you were lying down. Write down what you find Writing down what you find can help you remember what to tell your doctor. Write down: What is normal for each breast. Any changes you find in each breast. These include: The kind of changes you find. A tender or painful breast. Any lump you find. Write down its size and where it is. When you last had your monthly period (menstrual cycle). General tips If you are breastfeeding, the best time to check your breasts is after you feed your baby or after you use a breast pump. If you get monthly bleeding, the best time to check your breasts is 5-7 days after your monthly cycle ends. With time, you will become comfortable with the self-exam. You will also start to know if there are changes in your breasts. Contact a doctor if: You see a change in the shape or size of your breasts  or nipples. You see a change in the skin of your breast or nipples, such as red or scaly skin. You have fluid coming from your nipples that is not normal. You find a new lump or thick area. You have breast pain. You have any concerns about your breast health. Summary Breast self-awareness includes looking for changes in your breasts and feeling for changes within your breasts. You should do breast self-awareness in front of a mirror in a well-lit room. If you get monthly periods (menstrual cycles), the best time to check your breasts is  5-7 days after your period ends. Tell your doctor about any changes you see in your breasts. Changes include changes in size, changes on the skin, painful or tender breasts, or fluid from your nipples that is not normal. This information is not intended to replace advice given to you by your health care provider. Make sure you discuss any questions you have with your health care provider. Document Revised: 11/14/2021 Document Reviewed: 04/11/2021 Elsevier Patient Education  2024 [employer].

## 2023-02-17 NOTE — Progress Notes (Signed)
[facility] POST-OP OFFICE VISIT  02/17/2023  HPI: Michele Cruz is a 67 y.o. female right breast excisional biopsy, RFID tag guided.  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  Pathology report after excision noted below. We discussed the role of MRI screening and follow-up.  We discussed the side effects of her anastrozole. Patient denies any significant pain or issues with her right breast lumpectomy.    Asked if she had any regrets, she indicated she probably would have preferred just proceeding with bilateral mastectomies and being done with it all.  However she appears to be content with her current status.  Vital signs: BP 111/69   Pulse 80   Temp 98 F (36.7 C)   Ht 5' 6.25" (1.683 m)   Wt 218 lb (98.9 kg)   SpO2 98%   BMI 34.92 kg/m    Physical Exam: Constitutional: She appears quite well. Skin: Right lateral breast scar incision appears to be slightly contracted along the length, no surprise no underlying densities or dermal changes of concern.  Both breasts are without suspicious or dominant nodularity.  Skin looks quite normal-appearing on the right c/w left.  CLINICAL DATA:  Status post RIGHT lumpectomy in November 2023. Stereotactic guided biopsy for calcifications demonstrated DCIS. Surgical excision revealed LCIS. Status post radiation. On anastrozole.   EXAM: DIGITAL DIAGNOSTIC BILATERAL MAMMOGRAM WITH TOMOSYNTHESIS AND CAD   TECHNIQUE: Bilateral digital diagnostic mammography and breast tomosynthesis was performed. The images were evaluated with computer-aided detection.   COMPARISON:  Previous exam(s).   ACR Breast Density Category d: The breasts are extremely dense, which lowers the sensitivity of mammography.   FINDINGS: There is density and architectural distortion within the RIGHT breast, consistent with postsurgical changes. These are stable in comparison to prior. No suspicious mass, distortion, or microcalcifications are identified to  suggest presence of malignancy.   IMPRESSION: No mammographic evidence of malignancy bilaterally.   RECOMMENDATION: 1. Recommend bilateral diagnostic mammogram (with RIGHT and LEFT breast ultrasound if deemed necessary) in 1 year. 2. Patient has extreme breast density and history of malignancy. Supplemental screening with breast MRI with and without contrast/abbreviated breast MRI should be considered in this patient population. The American Cancer Society recommends annual MRI and mammography in patients with an estimated lifetime risk of developing breast cancer greater than 20 - 25%, or who are known or suspected to be positive for the breast cancer gene.   I have discussed the findings and recommendations with the patient. If applicable, a reminder letter will be sent to the patient regarding the next appointment.   BI-RADS CATEGORY  2: Benign.     Electronically Signed   By: Meda Klinefelter M.D.   On: 02/09/2023 12:13   SURGICAL PATHOLOGY CASE: [id number] PATIENT: [facility] Surgical Pathology Report  Specimen Submitted: A. 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 - CLIP AND BIOPSY SITE IDENTIFIED. - NEGATIVE FOR DUCTAL CARCINOMA IN SITU AND INVASIVE CARCINOMA.  Comment: Sections show an extensive intraepithelial proliferation, involving and expanding both ducts and lobular units, throughout nearly all sampling of the submitted breast tissue. Focal areas display cytomorphologic features more suggestive of lobular differentiation, and as such, E-cadherin stains were performed on multiple selected tissue blocks, including areas showing central comedo-type necrosis. The abnormal proliferation displays significantly reduced, to completely absent marking for E-cadherin  throughout, confirming lobular differentiation. Background benign ductal  elements show appropriate strong and membranous internal control staining. This proliferation extends to multiple surgical margins (posterior, lateral, and medial), although the significance of this is unclear.  Assessment/Plan: This is a 67 y.o. female  s/p right breast excisional biopsy for DCIS, however has component of LCIS/DCIS with comedonecrosis.  There were lobular proliferation at margins of excisional biopsy.  She has completed radiation treatment, and is taking her medications faithfully despite side effects.  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   Encounter for screening mammogram for breast cancer 01/24/2022   Estrogen deficiency 01/24/2022   Internal and external bleeding hemorrhoids 08/16/2021   Total knee replacement status 07/28/2018   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   Internal hemorrhoids 07/21/2017   Hydradenitis 07/21/2017   Obesity (BMI 30-39.9) 12/09/2016   Encounter for routine gynecological examination 01/10/2014   Routine general medical examination at a health care facility 03/25/2011   HYPERCHOLESTEROLEMIA 12/11/2009   COLONIC POLYPS, HX OF 08/03/2009   HERNIATED CERVICAL DISC 06/20/2009   Herniated cervical disc 06/20/2009   BACK PAIN, LUMBAR, WITH RADICULOPATHY 10/13/2008   Generalized anxiety disorder 05/24/2007   ALLERGIC RHINITIS 05/24/2007   ROSACEA 05/24/2007   CARDIAC MURMUR 05/24/2007    Breast MRI - will stagger with annual mammography.  Follow-up in a year or as needed.   Campbell Lerner M.D., FACS 02/17/2023, 1:34 PM

## 2023-02-18 DIAGNOSIS — M9903 Segmental and somatic dysfunction of lumbar region: Secondary | ICD-10-CM | POA: Diagnosis not present

## 2023-02-18 DIAGNOSIS — M5451 Vertebrogenic low back pain: Secondary | ICD-10-CM | POA: Diagnosis not present

## 2023-02-18 DIAGNOSIS — M9904 Segmental and somatic dysfunction of sacral region: Secondary | ICD-10-CM | POA: Diagnosis not present

## 2023-02-18 DIAGNOSIS — M7918 Myalgia, other site: Secondary | ICD-10-CM | POA: Diagnosis not present

## 2023-02-26 ENCOUNTER — Other Ambulatory Visit: Payer: Self-pay | Admitting: Medical Genetics

## 2023-02-26 DIAGNOSIS — Z006 Encounter for examination for normal comparison and control in clinical research program: Secondary | ICD-10-CM

## 2023-03-12 ENCOUNTER — Encounter: Payer: Self-pay | Admitting: Radiation Oncology

## 2023-03-12 ENCOUNTER — Ambulatory Visit
Admission: RE | Admit: 2023-03-12 | Discharge: 2023-03-12 | Disposition: A | Discharge status: Home / Self Care | Payer: Medicare PPO | Source: Ambulatory Visit | Attending: Radiation Oncology | Admitting: Radiation Oncology

## 2023-03-12 VITALS — BP 118/75 | HR 77 | Temp 98.3°F | Resp 14 | Ht 66.5 in | Wt 216.1 lb

## 2023-03-12 DIAGNOSIS — Z170 Estrogen receptor positive status [ER+]: Secondary | ICD-10-CM | POA: Diagnosis not present

## 2023-03-12 DIAGNOSIS — Z79811 Long term (current) use of aromatase inhibitors: Secondary | ICD-10-CM | POA: Insufficient documentation

## 2023-03-12 DIAGNOSIS — D0511 Intraductal carcinoma in situ of right breast: Secondary | ICD-10-CM | POA: Diagnosis not present

## 2023-03-12 DIAGNOSIS — Z923 Personal history of irradiation: Secondary | ICD-10-CM | POA: Insufficient documentation

## 2023-03-12 NOTE — Progress Notes (Signed)
Radiation Oncology Follow up Note  Name: Michele Cruz   Date:   03/12/2023 MRN:  [id number] DOB: [birth year]    This 67 y.o. female presents to the clinic today for 5-month follow-up status post whole breast radiation to her right breast for ER positive ductal carcinoma in situ.  REFERRING PROVIDER: Tower, Audrie Gallus, MD  HPI: Patient is a 67 year old female now out 7 months having pleated whole breast radiation to her right breast for ER positive ductal carcinoma in situ.  Seen today in routine follow-up she is doing well.  Specifically denies any breast tenderness cough or bone pain..  She had mammograms in August which I have reviewed were BI-RADS 2 benign.  She is currently on Arimidex tolerating it well without side effect.  COMPLICATIONS OF TREATMENT: none  FOLLOW UP COMPLIANCE: keeps appointments   PHYSICAL EXAM:  BP 118/75   Pulse 77   Temp 98.3 F (36.8 C)   Resp 14   Ht 5' 6.5" (1.689 m)   Wt 216 lb 1.6 oz (98 kg)   BMI 34.36 kg/m  Lungs are clear to A&P cardiac examination essentially unremarkable with regular rate and rhythm. No dominant mass or nodularity is noted in either breast in 2 positions examined. Incision is well-healed. No axillary or supraclavicular adenopathy is appreciated. Cosmetic result is excellent.  Well-developed well-nourished patient in NAD. HEENT reveals PERLA, EOMI, discs not visualized.  Oral cavity is clear. No oral mucosal lesions are identified. Neck is clear without evidence of cervical or supraclavicular adenopathy. Lungs are clear to A&P. Cardiac examination is essentially unremarkable with regular rate and rhythm without murmur rub or thrill. Abdomen is benign with no organomegaly or masses noted. Motor sensory and DTR levels are equal and symmetric in the upper and lower extremities. Cranial nerves II through XII are grossly intact. Proprioception is intact. No peripheral adenopathy or edema is identified. No motor or sensory levels are noted.  Crude visual fields are within normal range.  RADIOLOGY RESULTS: Mammograms reviewed compatible with above-stated findings  PLAN: Present time patient is doing well some 7 months out with no evidence of disease.  Of asked to see her back in 6 months for follow-up and then will go to once a year for yearly visits.  She continues on Arimidex without side effect.  Patient knows to call with any concerns.  I would like to take this opportunity to thank you for allowing me to participate in the care of your patient.Carmina Miller, MD

## 2023-04-08 ENCOUNTER — Other Ambulatory Visit: Payer: Medicare PPO

## 2023-05-15 ENCOUNTER — Encounter: Payer: Self-pay | Admitting: Oncology

## 2023-05-15 ENCOUNTER — Inpatient Hospital Stay: Payer: Medicare PPO | Attending: Oncology

## 2023-05-15 ENCOUNTER — Inpatient Hospital Stay (HOSPITAL_BASED_OUTPATIENT_CLINIC_OR_DEPARTMENT_OTHER): Payer: Medicare PPO | Admitting: Oncology

## 2023-05-15 VITALS — BP 121/72 | HR 65 | Temp 97.0°F | Resp 18 | Wt 213.3 lb

## 2023-05-15 DIAGNOSIS — D0511 Intraductal carcinoma in situ of right breast: Secondary | ICD-10-CM | POA: Insufficient documentation

## 2023-05-15 DIAGNOSIS — Z79811 Long term (current) use of aromatase inhibitors: Secondary | ICD-10-CM | POA: Insufficient documentation

## 2023-05-15 DIAGNOSIS — Z923 Personal history of irradiation: Secondary | ICD-10-CM | POA: Diagnosis not present

## 2023-05-15 DIAGNOSIS — Z170 Estrogen receptor positive status [ER+]: Secondary | ICD-10-CM | POA: Insufficient documentation

## 2023-05-15 LAB — CBC WITH DIFFERENTIAL (CANCER CENTER ONLY)
Abs Immature Granulocytes: 0.02 10*3/uL (ref 0.00–0.07)
Basophils Absolute: 0.1 10*3/uL (ref 0.0–0.1)
Basophils Relative: 1 %
Eosinophils Absolute: 0.1 10*3/uL (ref 0.0–0.5)
Eosinophils Relative: 3 %
HCT: 43.2 % (ref 36.0–46.0)
Hemoglobin: 14.5 g/dL (ref 12.0–15.0)
Immature Granulocytes: 0 %
Lymphocytes Relative: 28 %
Lymphs Abs: 1.3 10*3/uL (ref 0.7–4.0)
MCH: 29.6 pg (ref 26.0–34.0)
MCHC: 33.6 g/dL (ref 30.0–36.0)
MCV: 88.2 fL (ref 80.0–100.0)
Monocytes Absolute: 0.4 10*3/uL (ref 0.1–1.0)
Monocytes Relative: 8 %
Neutro Abs: 2.8 10*3/uL (ref 1.7–7.7)
Neutrophils Relative %: 60 %
Platelet Count: 175 10*3/uL (ref 150–400)
RBC: 4.9 MIL/uL (ref 3.87–5.11)
RDW: 13.2 % (ref 11.5–15.5)
WBC Count: 4.7 10*3/uL (ref 4.0–10.5)
nRBC: 0 % (ref 0.0–0.2)

## 2023-05-15 LAB — CMP (CANCER CENTER ONLY)
ALT: 19 U/L (ref 0–44)
AST: 21 U/L (ref 15–41)
Albumin: 4 g/dL (ref 3.5–5.0)
Alkaline Phosphatase: 64 U/L (ref 38–126)
Anion gap: 12 (ref 5–15)
BUN: 13 mg/dL (ref 8–23)
CO2: 26 mmol/L (ref 22–32)
Calcium: 9.5 mg/dL (ref 8.9–10.3)
Chloride: 102 mmol/L (ref 98–111)
Creatinine: 0.69 mg/dL (ref 0.44–1.00)
GFR, Estimated: >60 mL/min (ref >60)
Glucose, Bld: 92 mg/dL (ref 70–99)
Potassium: 3.8 mmol/L (ref 3.5–5.1)
Sodium: 140 mmol/L (ref 135–145)
Total Bilirubin: 1 mg/dL (ref <1.2)
Total Protein: 7.3 g/dL (ref 6.5–8.1)

## 2023-05-15 MED ORDER — ANASTROZOLE 1 MG PO TABS: 1.0000 mg | ORAL_TABLET | Freq: Every day | ORAL | 1 refills | Status: DC

## 2023-05-15 NOTE — Assessment & Plan Note (Addendum)
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 S/p adjuvant RT.  Declines genetic testing.  Patient tolerates Arimidex 1 mg daily.  Recommend patient to continue. Labs are reviewed and discussed with patient. Recent mammogram is negative.  Recommend annual bilateral mammogram-will obtain in August 2025.

## 2023-05-15 NOTE — Assessment & Plan Note (Signed)
Recommend calcium 1200mg  daily and Vitamin D supplementation.  10/13/2022, DEXA showed normal bone density.

## 2023-05-15 NOTE — Progress Notes (Signed)
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 S/p adjuvant RT.  Declines genetic testing.  Patient tolerates Arimidex 1 mg daily.  Recommend patient to continue. Labs are reviewed and discussed with patient. Recent mammogram is negative.  Recommend annual bilateral mammogram-will obtain in August 2025.  Aromatase inhibitor use Recommend calcium 1200mg  daily and Vitamin D supplementation.  10/13/2022, DEXA showed normal bone density.  Orders Placed This Encounter  Procedures   CMP (Cancer Center only)    Standing Status:   Future    Standing Expiration Date:   05/14/2024   CBC with Differential (Cancer Center Only)    Standing Status:   Future    Standing Expiration Date:   05/14/2024   Follow-up  6 months.  All questions were answered. The patient knows to call the clinic with any problems, questions or concerns.  Rickard Patience, MD, PhD [facility] Hematology Oncology 05/15/2023       HISTORY OF PRESENTING ILLNESS:  Michele Cruz 67 y.o. female presents to establish care for right breast DCIS I have reviewed her chart and materials related to her cancer extensively and collaborated history with the patient. Summary of oncologic history is as follows: Oncology History  DCIS (ductal carcinoma in situ)  02/10/2022 Imaging   Bilateral screening mammogram showed further evaluation suggested for calcifications in the right breast.   02/20/2022 Mammogram   Unilateral right diagnostic mammogram showed  Round and  punctate calcifications are identified in the lateral central right breast accounting for the screening findings. These calcifications span up to 6 mm and do not layer.   04/01/2022 Initial Diagnosis   DCIS (ductal carcinoma in situ)  03/28/2022 right breast biopsy DCIS, intemediate to high grade, solid and cribriform types with focal necrosis and extensive cancerization of lobules.  ER+100%, PR+50%   Menarche at age of 33 First live birth at age of 89 OCP use: 5 years Menopausal status: Postmenopausal History of HRT use: Denies History of chest radiation: Denies Number of previous breast biopsies: Denies  Patient denies any family history of breast cancer.   04/02/2022 Cancer Staging   Staging form: Breast, AJCC 8th Edition - Clinical stage from 04/02/2022: Stage 0 (cTis (DCIS), cN0, cM0, ER+, PR+, HER2: Not Assessed) - Signed by Rickard Patience, MD on 04/02/2022 Stage prefix: Initial diagnosis   05/07/2022 Surgery   Patient underwent right breast lumpectomy  Pathology showed florid lobular neoplasm (lobular carcinoma in situ) with extensive ductal involvement and focal comedo type necrosis.  Negative for DCIS and invasive carcinoma.    Radiation Therapy     06/26/2022 - 07/25/2022 Radiation Therapy   S/p adjuvant breast radiation.     INTERVAL HISTORY Michele Cruz is a 67 y.o. female who has above history reviewed by me today presents for follow up visit for history of right breast DCIS, status post lumpectomy followed by radiation. Patient currently takes Arimidex 1 mg daily.    No new complaints. She tolerates well with manageable hot flash, joint/muscle pain.   MEDICAL HISTORY:  Past Medical History:  Diagnosis Date  Allergic rhinitis    Allergy 10/1980   Anemia, iron deficiency    Anxiety    Arthritis    Asthma    no symptoms last 25 years   Cataract 01/02/20, 01/16/20   Colon polyps    COVID-19 07/2020   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   Family  history of adverse reaction to anesthesia    sister PONV   Heart murmur    Heavy menses    Hemorrhoids    Hidradenitis suppurativa    History of exercise stress test 06/1999   neg   History of methicillin resistant staphylococcus aureus (MRSA) 2019   + surgical pcr swab   History of MRI 2003   neg per patient   History of MRI 2007   right quad fatty defect   History of MRI of spine 03/2004   C-5, C5-6 disk protrusion   Hyperlipidemia    LDL   Rosacea    Spondylosis    deg lumbar disc dz   Wisdom teeth removed    Zoster     SURGICAL HISTORY: Past Surgical History:  Procedure Laterality Date   BREAST BIOPSY Right 03/28/2022   breast center in [place]   BREAST CYST ASPIRATION  03/2002   BREAST CYST ASPIRATION  03/2004   left   BREAST LUMPECTOMY WITH RADIOFREQUENCY TAG IDENTIFICATION Right 05/07/2022   Procedure: BREAST LUMPECTOMY WITH RADIOFREQUENCY TAG IDENTIFICATION;  Surgeon: Campbell Lerner, MD;  Location: [facility] ORS;  Service: General;  Laterality: Right;   CATARACT EXTRACTION Bilateral    CESAREAN SECTION     COLONOSCOPY  01/26/2012   EYE SURGERY  7/12&26/2021   Cataracts   HEMORRHOID BANDING  08/2021   JOINT REPLACEMENT  03/15/2018 & 07/28/2018   TKR left & right   KNEE ARTHROPLASTY Left 03/15/2018   Procedure: COMPUTER ASSISTED TOTAL KNEE ARTHROPLASTY;  Surgeon: Donato Heinz, MD;  Location: [facility] ORS;  Service: Orthopedics;  Laterality: Left;   KNEE ARTHROPLASTY Right 07/28/2018   Procedure: COMPUTER ASSISTED TOTAL KNEE ARTHROPLASTY;  Surgeon: Donato Heinz, MD;  Location: [facility] ORS;  Service: Orthopedics;  Laterality: Right;   KNEE SURGERY Right     SOCIAL HISTORY: Social History   Socioeconomic History   Marital status: Married    Spouse name: Not on file   Number of children: 1   Years of education: Not on file   Highest education level: Bachelor's degree (e.g., BA, AB, BS)  Occupational History   Occupation: INSTRUCTIONAL ASST    Comment:  Art gallery manager  Tobacco Use   Smoking status: Never    Passive exposure: Never   Smokeless tobacco: Never  Vaping Use   Vaping status: Never Used  Substance and Sexual Activity   Alcohol use: Yes    Alcohol/week: 4.0 standard drinks of alcohol    Comment: 1-2 drinks/month   Drug use: No   Sexual activity: Not Currently    Birth control/protection: Post-menopausal  Other Topics Concern   Not on file  Social History Narrative   One daughter   Daily caffeine; use 1-2 daily   Social Determinants of Health   Financial Resource Strain: Low Risk  (01/28/2023)   Overall Financial Resource Strain (CARDIA)    Difficulty of Paying Living Expenses: Not hard at all  Food Insecurity: No Food Insecurity (01/28/2023)   Hunger Vital Sign    Worried About Running Out of Food in the Last Year: Never true    Ran Out of Food  in the Last Year: Never true  Transportation Needs: No Transportation Needs (01/28/2023)   PRAPARE - Administrator, Civil Service (Medical): No    Lack of Transportation (Non-Medical): No  Physical Activity: Sufficiently Active (01/28/2023)   Exercise Vital Sign    Days of Exercise per Week: 5 days    Minutes of Exercise per Session: 60 min  Stress: No Stress Concern Present (01/28/2023)   [employer] of Occupational Health - Occupational Stress Questionnaire    Feeling of Stress : Only a little  Social Connections: Moderately Isolated (01/28/2023)   Social Connection and Isolation Panel [NHANES]    Frequency of Communication with Friends and Family: Once a week    Frequency of Social Gatherings with Friends and Family: More than three times a week    Attends Religious Services: Never    Database administrator or Organizations: No    Attends Banker Meetings: Patient declined    Marital Status: Married  Catering manager Violence: Not At Risk (01/29/2023)   Humiliation, Afraid, Rape, and Kick questionnaire    Fear of Current or Ex-Partner: No     Emotionally Abused: No    Physically Abused: No    Sexually Abused: No    FAMILY HISTORY: Family History  Problem Relation Age of Onset   Arthritis Mother        OA   Obesity Mother    Diverticulitis Mother    Colon polyps Mother    Hypertension Mother    Heart disease Father        CAD   Alcohol abuse Father    Diabetes Father    Hyperlipidemia Sister    Diverticulitis Sister    Diabetes Other    Diverticulitis Sister    Diabetes Maternal Grandmother    Hyperlipidemia Paternal Grandmother    Asthma Sister    Hyperlipidemia Sister    Colon cancer Neg Hx    Breast cancer Neg Hx    Esophageal cancer Neg Hx    Rectal cancer Neg Hx    Stomach cancer Neg Hx     ALLERGIES:  is allergic to decadron [dexamethasone], crestor [rosuvastatin], depo-medrol [methylprednisolone acetate], lipitor [atorvastatin calcium], meloxicam, and simvastatin.  MEDICATIONS:  Current Outpatient Medications  Medication Sig Dispense Refill   Ascorbic Acid (VITAMIN C) 500 MG CHEW Chew 1 tablet by mouth daily at 6 (six) AM.     Calcium-Vitamin D-Vitamin K 750-500-40 MG-UNT-MCG TABS Take 1 capsule by mouth daily.     Coenzyme Q10 (CO Q 10) 100 MG CAPS Take 1 capsule by mouth daily at 6 (six) AM.     COLLAGEN PO Take 1 Scoop by mouth daily. BioTRUST Multi-collagen, 10 g bioactive daily     Flaxseed, Linseed, (FLAXSEED OIL PO) Take 1 tablet by mouth daily.     Loratadine 10 MG CAPS Take 1 capsule by mouth daily.     Magnesium 500 MG CAPS Take 1 capsule by mouth daily at 6 (six) AM.     Misc Natural Products (TURMERIC CURCUMIN) CAPS Take 1 capsule by mouth daily. Theracurmin     Multiple Vitamins-Minerals (CENTRUM SILVER 50+WOMEN) TABS Take 1 capsule by mouth daily.     NONFORMULARY OR COMPOUNDED ITEM Azelaic Acid/Metronidazole/Ivermectin cream pump     Omega-3 Fatty Acids (FISH OIL) 1000 MG CAPS Take 1 capsule by mouth daily.     vitamin E 200 UNIT capsule Take 200 Units by mouth daily.     anastrozole  (ARIMIDEX)  1 MG tablet Take 1 tablet (1 mg total) by mouth daily. 90 tablet 1   fluticasone (FLONASE) 50 MCG/ACT nasal spray Place 2 sprays into both nostrils daily. (Patient not taking: Reported on 05/15/2023) 16 g 6   meclizine (ANTIVERT) 25 MG tablet Take 1 tablet (25 mg total) by mouth 3 (three) times daily as needed for dizziness. Caution of sedation (Patient not taking: Reported on 05/15/2023) 30 tablet 0   No current facility-administered medications for this visit.    Review of Systems  Constitutional:  Negative for appetite change, chills, fatigue and fever.  HENT:   Negative for hearing loss and voice change.   Eyes:  Negative for eye problems.  Respiratory:  Negative for chest tightness and cough.   Cardiovascular:  Negative for chest pain.  Gastrointestinal:  Negative for abdominal distention, abdominal pain and blood in stool.  Endocrine: Negative for hot flashes.  Genitourinary:  Negative for difficulty urinating and frequency.   Musculoskeletal:  Negative for arthralgias.  Skin:  Negative for itching and rash.  Neurological:  Negative for extremity weakness.  Hematological:  Negative for adenopathy.  Psychiatric/Behavioral:  Negative for confusion.      PHYSICAL EXAMINATION: ECOG PERFORMANCE STATUS: 0 - Asymptomatic  Vitals:   05/15/23 1120 05/15/23 1122  BP: (!) 140/73 121/72  Pulse: 61 65  Resp: 18   Temp: (!) 97 F (36.1 C)    Filed Weights   05/15/23 1120  Weight: 213 lb 4.8 oz (96.8 kg)    Physical Exam Constitutional:      General: She is not in acute distress.    Appearance: She is not diaphoretic.  HENT:     Head: Normocephalic and atraumatic.     Mouth/Throat:     Pharynx: No oropharyngeal exudate.  Eyes:     General: No scleral icterus. Cardiovascular:     Rate and Rhythm: Normal rate.  Pulmonary:     Effort: Pulmonary effort is normal. No respiratory distress.  Abdominal:     General: There is no distension.     Palpations: Abdomen is  soft.  Musculoskeletal:        General: Normal range of motion.     Cervical back: Normal range of motion and neck supple.  Skin:    Findings: No erythema.  Neurological:     Mental Status: She is alert and oriented to person, place, and time. Mental status is at baseline.     Cranial Nerves: No cranial nerve deficit.     Motor: No abnormal muscle tone.  Psychiatric:        Mood and Affect: Mood and affect normal.   Breast exam was performed in seated and lying down position. Patient is status post right lumpectomy with a well-healed surgical scar.   No palpable breast masses bilaterally.  No palpable axillary adenopathy bilaterally.   LABORATORY DATA:  I have reviewed the data as listed    Latest Ref Rng & Units 05/15/2023   11:09 AM 01/19/2023    8:55 AM 11/12/2022    1:08 PM  CBC  WBC 4.0 - 10.5 K/uL 4.7  4.4  4.8   Hemoglobin 12.0 - 15.0 g/dL 19.1  47.8  29.5   Hematocrit 36.0 - 46.0 % 43.2  43.4  44.0   Platelets 150 - 400 K/uL 175  197.0  174       Latest Ref Rng & Units 05/15/2023   11:09 AM 01/19/2023    8:55 AM 11/12/2022  1:08 PM  CMP  Glucose 70 - 99 mg/dL 92  409  811   BUN 8 - 23 mg/dL 13  24  25    Creatinine 0.44 - 1.00 mg/dL 9.14  7.82  9.56   Sodium 135 - 145 mmol/L 140  139  139   Potassium 3.5 - 5.1 mmol/L 3.8  4.3  4.5   Chloride 98 - 111 mmol/L 102  103  105   CO2 22 - 32 mmol/L 26  27  26    Calcium 8.9 - 10.3 mg/dL 9.5  9.4  9.3   Total Protein 6.5 - 8.1 g/dL 7.3  6.4  7.0   Total Bilirubin <1.2 mg/dL 1.0  0.9  0.8   Alkaline Phos 38 - 126 U/L 64  74  65   AST 15 - 41 U/L 21  18  41   ALT 0 - 44 U/L 19  15  38      RADIOGRAPHIC STUDIES: I have personally reviewed the radiological images as listed and agreed with the findings in the report. No results found.

## 2023-05-18 ENCOUNTER — Ambulatory Visit: Payer: Medicare PPO | Admitting: Oncology

## 2023-05-18 ENCOUNTER — Other Ambulatory Visit: Payer: Medicare PPO

## 2023-05-24 DIAGNOSIS — S63284A Dislocation of proximal interphalangeal joint of right ring finger, initial encounter: Secondary | ICD-10-CM | POA: Diagnosis not present

## 2023-05-24 DIAGNOSIS — S63282A Dislocation of proximal interphalangeal joint of right middle finger, initial encounter: Secondary | ICD-10-CM | POA: Diagnosis not present

## 2023-06-01 DIAGNOSIS — S63274A Dislocation of unspecified interphalangeal joint of right ring finger, initial encounter: Secondary | ICD-10-CM | POA: Diagnosis not present

## 2023-06-01 DIAGNOSIS — M25541 Pain in joints of right hand: Secondary | ICD-10-CM | POA: Diagnosis not present

## 2023-06-01 DIAGNOSIS — S63272A Dislocation of unspecified interphalangeal joint of right middle finger, initial encounter: Secondary | ICD-10-CM | POA: Diagnosis not present

## 2023-06-09 DIAGNOSIS — L821 Other seborrheic keratosis: Secondary | ICD-10-CM | POA: Diagnosis not present

## 2023-06-09 DIAGNOSIS — D225 Melanocytic nevi of trunk: Secondary | ICD-10-CM | POA: Diagnosis not present

## 2023-06-09 DIAGNOSIS — D2271 Melanocytic nevi of right lower limb, including hip: Secondary | ICD-10-CM | POA: Diagnosis not present

## 2023-06-09 DIAGNOSIS — D2272 Melanocytic nevi of left lower limb, including hip: Secondary | ICD-10-CM | POA: Diagnosis not present

## 2023-06-09 DIAGNOSIS — D2262 Melanocytic nevi of left upper limb, including shoulder: Secondary | ICD-10-CM | POA: Diagnosis not present

## 2023-06-09 DIAGNOSIS — D2261 Melanocytic nevi of right upper limb, including shoulder: Secondary | ICD-10-CM | POA: Diagnosis not present

## 2023-06-09 DIAGNOSIS — L718 Other rosacea: Secondary | ICD-10-CM | POA: Diagnosis not present

## 2023-07-10 ENCOUNTER — Other Ambulatory Visit: Payer: Self-pay

## 2023-07-10 DIAGNOSIS — D0511 Intraductal carcinoma in situ of right breast: Secondary | ICD-10-CM

## 2023-07-21 ENCOUNTER — Telehealth: Payer: Self-pay

## 2023-07-21 NOTE — Telephone Encounter (Signed)
Message left for the patient about scheduling her MRI. She may call MRI to have this done at her convenience, [phone].

## 2023-08-12 ENCOUNTER — Ambulatory Visit
Admission: RE | Admit: 2023-08-12 | Discharge: 2023-08-12 | Disposition: A | Discharge status: Home / Self Care | Payer: Medicare PPO | Source: Ambulatory Visit | Attending: Surgery | Admitting: Surgery

## 2023-08-12 DIAGNOSIS — D0511 Intraductal carcinoma in situ of right breast: Secondary | ICD-10-CM | POA: Insufficient documentation

## 2023-08-12 DIAGNOSIS — Z853 Personal history of malignant neoplasm of breast: Secondary | ICD-10-CM | POA: Diagnosis not present

## 2023-08-12 DIAGNOSIS — N6489 Other specified disorders of breast: Secondary | ICD-10-CM | POA: Diagnosis not present

## 2023-08-12 MED ORDER — GADOBUTROL 1 MMOL/ML IV SOLN
10.0000 mL | Freq: Once | INTRAVENOUS | Status: AC | PRN
  Administered 2023-08-12: 10 mL via INTRAVENOUS

## 2023-09-14 ENCOUNTER — Encounter: Payer: Self-pay | Admitting: Radiation Oncology

## 2023-09-14 ENCOUNTER — Ambulatory Visit
Admission: RE | Admit: 2023-09-14 | Discharge: 2023-09-14 | Disposition: A | Discharge status: Home / Self Care | Payer: Medicare PPO | Source: Ambulatory Visit | Attending: Radiation Oncology | Admitting: Radiation Oncology

## 2023-09-14 VITALS — BP 111/67 | HR 89 | Resp 17 | Wt 216.0 lb

## 2023-09-14 DIAGNOSIS — Z923 Personal history of irradiation: Secondary | ICD-10-CM | POA: Insufficient documentation

## 2023-09-14 DIAGNOSIS — Z170 Estrogen receptor positive status [ER+]: Secondary | ICD-10-CM | POA: Diagnosis not present

## 2023-09-14 DIAGNOSIS — Z79811 Long term (current) use of aromatase inhibitors: Secondary | ICD-10-CM | POA: Insufficient documentation

## 2023-09-14 DIAGNOSIS — D0511 Intraductal carcinoma in situ of right breast: Secondary | ICD-10-CM | POA: Diagnosis not present

## 2023-09-14 NOTE — Progress Notes (Signed)
 Radiation Oncology Follow up Note  Name: Michele Cruz   Date:   09/14/2023 MRN:  [id number] DOB: 04-08-1956    This 68 y.o. female presents to the clinic today for 66-month follow-up status post whole breast radiation to her right breast for ER positive ductal carcinoma in situ.  REFERRING PROVIDER: Tower, Audrie Gallus, MD  HPI: Patient is a 68 year old female now out 13 months having completed whole breast radiation to her right breast for ER positive ductal carcinoma in situ.  Seen today in routine follow-up she is doing well.  Specifically denies breast tenderness cough or bone pain.  She is somewhat fatigued.  She is on Arimidex and tolerating it well.  She had MRIs of her breast which I have reviewed back in February showing no evidence of disease..  COMPLICATIONS OF TREATMENT: none  FOLLOW UP COMPLIANCE: keeps appointments   PHYSICAL EXAM:  BP 111/67   Pulse 89   Resp 17   Wt 216 lb (98 kg)   SpO2 98%   BMI 34.34 kg/m  Lungs are clear to A&P cardiac examination essentially unremarkable with regular rate and rhythm. No dominant mass or nodularity is noted in either breast in 2 positions examined. Incision is well-healed. No axillary or supraclavicular adenopathy is appreciated. Cosmetic result is excellent.  Well-developed well-nourished patient in NAD. HEENT reveals PERLA, EOMI, discs not visualized.  Oral cavity is clear. No oral mucosal lesions are identified. Neck is clear without evidence of cervical or supraclavicular adenopathy. Lungs are clear to A&P. Cardiac examination is essentially unremarkable with regular rate and rhythm without murmur rub or thrill. Abdomen is benign with no organomegaly or masses noted. Motor sensory and DTR levels are equal and symmetric in the upper and lower extremities. Cranial nerves II through XII are grossly intact. Proprioception is intact. No peripheral adenopathy or edema is identified. No motor or sensory levels are noted. Crude visual fields are  within normal range.  RADIOLOGY RESULTS: MRI scans reviewed compatible with above-stated findings  PLAN: Present time patient is doing well and now out over a year with no evidence of disease.  Of asked to see her back in 1 year for follow-up.  She continues on Arimidex without side effect.  Patient knows to call with any concerns at any time.  I would like to take this opportunity to thank you for allowing me to participate in the care of your patient.Carmina Miller, MD

## 2023-10-16 ENCOUNTER — Other Ambulatory Visit: Payer: Self-pay | Admitting: Oncology

## 2023-11-13 ENCOUNTER — Encounter: Payer: Self-pay | Admitting: Oncology

## 2023-11-13 ENCOUNTER — Inpatient Hospital Stay: Payer: Medicare PPO | Attending: Oncology

## 2023-11-13 ENCOUNTER — Inpatient Hospital Stay (HOSPITAL_BASED_OUTPATIENT_CLINIC_OR_DEPARTMENT_OTHER): Payer: Medicare PPO | Admitting: Oncology

## 2023-11-13 VITALS — BP 125/78 | HR 74 | Temp 97.7°F | Resp 18 | Wt 212.2 lb

## 2023-11-13 DIAGNOSIS — R232 Flushing: Secondary | ICD-10-CM | POA: Insufficient documentation

## 2023-11-13 DIAGNOSIS — Z8614 Personal history of Methicillin resistant Staphylococcus aureus infection: Secondary | ICD-10-CM | POA: Diagnosis not present

## 2023-11-13 DIAGNOSIS — E785 Hyperlipidemia, unspecified: Secondary | ICD-10-CM | POA: Diagnosis not present

## 2023-11-13 DIAGNOSIS — Z79811 Long term (current) use of aromatase inhibitors: Secondary | ICD-10-CM | POA: Diagnosis not present

## 2023-11-13 DIAGNOSIS — R011 Cardiac murmur, unspecified: Secondary | ICD-10-CM | POA: Insufficient documentation

## 2023-11-13 DIAGNOSIS — J45909 Unspecified asthma, uncomplicated: Secondary | ICD-10-CM | POA: Insufficient documentation

## 2023-11-13 DIAGNOSIS — M791 Myalgia, unspecified site: Secondary | ICD-10-CM | POA: Diagnosis not present

## 2023-11-13 DIAGNOSIS — Z923 Personal history of irradiation: Secondary | ICD-10-CM | POA: Diagnosis not present

## 2023-11-13 DIAGNOSIS — D0511 Intraductal carcinoma in situ of right breast: Secondary | ICD-10-CM | POA: Insufficient documentation

## 2023-11-13 DIAGNOSIS — Z8616 Personal history of COVID-19: Secondary | ICD-10-CM | POA: Diagnosis not present

## 2023-11-13 DIAGNOSIS — Z860100 Personal history of colon polyps, unspecified: Secondary | ICD-10-CM | POA: Insufficient documentation

## 2023-11-13 DIAGNOSIS — Z170 Estrogen receptor positive status [ER+]: Secondary | ICD-10-CM | POA: Insufficient documentation

## 2023-11-13 DIAGNOSIS — M1990 Unspecified osteoarthritis, unspecified site: Secondary | ICD-10-CM | POA: Insufficient documentation

## 2023-11-13 LAB — CMP (CANCER CENTER ONLY)
ALT: 20 U/L (ref 0–44)
AST: 23 U/L (ref 15–41)
Albumin: 3.7 g/dL (ref 3.5–5.0)
Alkaline Phosphatase: 72 U/L (ref 38–126)
Anion gap: 9 (ref 5–15)
BUN: 25 mg/dL — ABNORMAL HIGH (ref 8–23)
CO2: 26 mmol/L (ref 22–32)
Calcium: 9.4 mg/dL (ref 8.9–10.3)
Chloride: 104 mmol/L (ref 98–111)
Creatinine: 0.72 mg/dL (ref 0.44–1.00)
GFR, Estimated: >60 mL/min (ref >60)
Glucose, Bld: 113 mg/dL — ABNORMAL HIGH (ref 70–99)
Potassium: 4.8 mmol/L (ref 3.5–5.1)
Sodium: 139 mmol/L (ref 135–145)
Total Bilirubin: 0.9 mg/dL (ref 0.0–1.2)
Total Protein: 6.2 g/dL — ABNORMAL LOW (ref 6.5–8.1)

## 2023-11-13 LAB — CBC WITH DIFFERENTIAL (CANCER CENTER ONLY)
Abs Immature Granulocytes: 0.02 10*3/uL (ref 0.00–0.07)
Basophils Absolute: 0.1 10*3/uL (ref 0.0–0.1)
Basophils Relative: 2 %
Eosinophils Absolute: 0.5 10*3/uL (ref 0.0–0.5)
Eosinophils Relative: 8 %
HCT: 40.5 % (ref 36.0–46.0)
Hemoglobin: 13.5 g/dL (ref 12.0–15.0)
Immature Granulocytes: 0 %
Lymphocytes Relative: 24 %
Lymphs Abs: 1.5 10*3/uL (ref 0.7–4.0)
MCH: 29.3 pg (ref 26.0–34.0)
MCHC: 33.3 g/dL (ref 30.0–36.0)
MCV: 87.9 fL (ref 80.0–100.0)
Monocytes Absolute: 0.4 10*3/uL (ref 0.1–1.0)
Monocytes Relative: 6 %
Neutro Abs: 3.8 10*3/uL (ref 1.7–7.7)
Neutrophils Relative %: 60 %
Platelet Count: 206 10*3/uL (ref 150–400)
RBC: 4.61 MIL/uL (ref 3.87–5.11)
RDW: 13.3 % (ref 11.5–15.5)
WBC Count: 6.2 10*3/uL (ref 4.0–10.5)
nRBC: 0 % (ref 0.0–0.2)

## 2023-11-13 NOTE — Assessment & Plan Note (Signed)
 Recommend calcium 1200mg  daily and Vitamin D supplementation.  10/13/2022, DEXA showed normal bone density.

## 2023-11-13 NOTE — Assessment & Plan Note (Addendum)
 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 S/p adjuvant RT.  Declines genetic testing.  Continue Arimidex  1 mg daily.  Plan 5 years , till Feb 2029 Labs are reviewed and discussed with patient. Recommend annual bilateral mammogram-will obtain in August 2025.

## 2023-11-14 NOTE — Progress Notes (Signed)
 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 S/p adjuvant RT.  Declines genetic testing.  Continue Arimidex  1 mg daily.  . Labs are reviewed and discussed with patient. Recommend annual bilateral mammogram-will obtain in August 2025.  Aromatase inhibitor use Recommend calcium  1200mg  daily and Vitamin D supplementation.  10/13/2022, DEXA showed normal bone density.  Orders Placed This Encounter  Procedures   MM 3D DIAGNOSTIC MAMMOGRAM BILATERAL BREAST    Standing Status:   Future    Expected Date:   02/03/2024    Expiration Date:   11/12/2024    Reason for Exam (SYMPTOM  OR DIAGNOSIS REQUIRED):   DCIS    Preferred imaging location?:    [facility]   CBC with Differential (Cancer Center Only)    Standing Status:   Future    Expected Date:   05/15/2024    Expiration Date:   11/12/2024   CMP (Cancer Center only)    Standing Status:   Future    Expected Date:   05/15/2024    Expiration Date:   11/12/2024   Follow-up  6 months.  All questions were answered. The patient knows to call the clinic with any problems, questions or concerns.  Timmy Forbes, MD, PhD [facility] Hematology Oncology 11/13/2023       HISTORY OF PRESENTING ILLNESS:  Michele Cruz 68 y.o. female presents to establish care for right breast DCIS I have reviewed her chart and materials related to her cancer extensively and collaborated history with the patient. Summary of oncologic history is as follows: Oncology History  DCIS (ductal  carcinoma in situ)  02/10/2022 Imaging   Bilateral screening mammogram showed further evaluation suggested for calcifications in the right breast.   02/20/2022 Mammogram   Unilateral right diagnostic mammogram showed  Round and punctate calcifications are identified in the lateral central right breast accounting for the screening findings. These calcifications span up to 6 mm and do not layer.   04/01/2022 Initial Diagnosis   DCIS (ductal carcinoma in situ)  03/28/2022 right breast biopsy DCIS, intemediate to high grade, solid and cribriform types with focal necrosis and extensive cancerization of lobules.  ER+100%, PR+50%   Menarche at age of 43 First live birth at age of 23 OCP use: 5 years Menopausal status: Postmenopausal History of HRT use: Denies History of chest radiation: Denies Number of previous breast biopsies: Denies  Patient denies any family history of breast cancer.   04/02/2022 Cancer Staging   Staging form: Breast, AJCC 8th Edition - Clinical stage from 04/02/2022: Stage 0 (cTis (DCIS), cN0, cM0, ER+, PR+, HER2: Not Assessed) - Signed by Timmy Forbes, MD on 04/02/2022 Stage prefix: Initial diagnosis   05/07/2022 Surgery   Patient underwent right breast lumpectomy  Pathology showed florid lobular neoplasm (lobular carcinoma in situ) with extensive ductal involvement and focal comedo type necrosis.  Negative for DCIS and invasive carcinoma.    Radiation Therapy     06/26/2022 - 07/25/2022 Radiation Therapy   S/p adjuvant breast radiation.    08/14/2022 -  Anti-estrogen oral  therapy   Start on Arimidex      INTERVAL HISTORY Michele Cruz is a 68 y.o. female who has above history reviewed by me today presents for follow up visit for history of right breast DCIS, status post lumpectomy followed by radiation. Patient currently takes Arimidex  1 mg daily.    No new complaints. She tolerates well with manageable hot flash, joint/muscle pain.   MEDICAL HISTORY:  Past  Medical History:  Diagnosis Date   Allergic rhinitis    Allergy 10/1980   Anemia, iron deficiency    Anxiety    Arthritis    Asthma    no symptoms last 25 years   Cataract 01/02/20, 01/16/20   Colon polyps    COVID-19 07/2020   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   Family history of adverse reaction to anesthesia    sister PONV   Heart murmur    Heavy menses    Hemorrhoids    Hidradenitis suppurativa    History of exercise stress test 06/1999   neg   History of methicillin resistant staphylococcus aureus (MRSA) 2019   + surgical pcr swab   History of MRI 2003   neg per patient   History of MRI 2007   right quad fatty defect   History of MRI of spine 03/2004   C-5, C5-6 disk protrusion   Hyperlipidemia    LDL   Rosacea    Spondylosis    deg lumbar disc dz   Wisdom teeth removed    Zoster     SURGICAL HISTORY: Past Surgical History:  Procedure Laterality Date   BREAST BIOPSY Right 03/28/2022   breast center in [place]   BREAST CYST ASPIRATION  03/2002   BREAST CYST ASPIRATION  03/2004   left   BREAST LUMPECTOMY WITH RADIOFREQUENCY TAG IDENTIFICATION Right 05/07/2022   Procedure: BREAST LUMPECTOMY WITH RADIOFREQUENCY TAG IDENTIFICATION;  Surgeon: Flynn Hylan, MD;  Location: [facility] ORS;  Service: General;  Laterality: Right;   CATARACT EXTRACTION Bilateral    CESAREAN SECTION     COLONOSCOPY  01/26/2012   EYE SURGERY  7/12&26/2021   Cataracts   HEMORRHOID BANDING  08/2021   JOINT REPLACEMENT  03/15/2018 & 07/28/2018   TKR left & right   KNEE ARTHROPLASTY Left 03/15/2018   Procedure: COMPUTER ASSISTED TOTAL KNEE ARTHROPLASTY;  Surgeon: Arlyne Lame, MD;  Location: [facility] ORS;  Service: Orthopedics;  Laterality: Left;   KNEE ARTHROPLASTY Right 07/28/2018   Procedure: COMPUTER ASSISTED TOTAL KNEE ARTHROPLASTY;  Surgeon: Arlyne Lame, MD;  Location: [facility] ORS;  Service: Orthopedics;  Laterality: Right;   KNEE SURGERY Right     SOCIAL  HISTORY: Social History   Socioeconomic History   Marital status: Married    Spouse name: Not on file   Number of children: 1   Years of education: Not on file   Highest education level: Bachelor's degree (e.g., BA, AB, BS)  Occupational History   Occupation: INSTRUCTIONAL ASST    Comment: Art gallery manager  Tobacco Use   Smoking status: Never    Passive exposure: Never   Smokeless tobacco: Never  Vaping Use   Vaping status: Never Used  Substance and Sexual Activity   Alcohol  use: Yes    Alcohol /week: 4.0 standard drinks of alcohol     Comment: 1-2 drinks/month   Drug use: No   Sexual activity: Not Currently    Birth control/protection: Post-menopausal  Other Topics Concern   Not on file  Social History Narrative  One daughter   Daily caffeine; use 1-2 daily   Social Drivers of Health   Financial Resource Strain: Low Risk  (06/01/2023)   Received [facility]   Overall Financial Resource Strain (CARDIA)    Difficulty of Paying Living Expenses: Not hard at all  Food Insecurity: No Food Insecurity (06/01/2023)   Received from [facility]   Hunger Vital Sign    Worried About Running Out of Food in the Last Year: Never true    Ran Out of Food in the Last Year: Never true  Transportation Needs: No Transportation Needs (06/01/2023)   Received from [facility] - Transportation    In the past 12 months, has lack of transportation kept you from medical appointments or from getting medications?: No    Lack of Transportation (Non-Medical): No  Physical Activity: Sufficiently Active (01/28/2023)   Exercise Vital Sign    Days of Exercise per Week: 5 days    Minutes of Exercise per Session: 60 min  Stress: No Stress Concern Present (01/28/2023)   [employer] of Occupational Health - Occupational Stress Questionnaire    Feeling of Stress : Only a little  Social Connections: Moderately Isolated (01/28/2023)    Social Connection and Isolation Panel [NHANES]    Frequency of Communication with Friends and Family: Once a week    Frequency of Social Gatherings with Friends and Family: More than three times a week    Attends Religious Services: Never    Database administrator or Organizations: No    Attends Banker Meetings: Patient declined    Marital Status: Married  Catering manager Violence: Not At Risk (01/29/2023)   Humiliation, Afraid, Rape, and Kick questionnaire    Fear of Current or Ex-Partner: No    Emotionally Abused: No    Physically Abused: No    Sexually Abused: No    FAMILY HISTORY: Family History  Problem Relation Age of Onset   Arthritis Mother        OA   Obesity Mother    Diverticulitis Mother    Colon polyps Mother    Hypertension Mother    Heart disease Father        CAD   Alcohol  abuse Father    Diabetes Father    Hyperlipidemia Sister    Diverticulitis Sister    Diabetes Other    Diverticulitis Sister    Diabetes Maternal Grandmother    Hyperlipidemia Paternal Grandmother    Asthma Sister    Hyperlipidemia Sister    Colon cancer Neg Hx    Breast cancer Neg Hx    Esophageal cancer Neg Hx    Rectal cancer Neg Hx    Stomach cancer Neg Hx     ALLERGIES:  is allergic to decadron  [dexamethasone ], crestor  [rosuvastatin ], depo-medrol [methylprednisolone acetate], lipitor [atorvastatin  calcium ], meloxicam, and simvastatin.  MEDICATIONS:  Current Outpatient Medications  Medication Sig Dispense Refill   anastrozole  (ARIMIDEX ) 1 MG tablet TAKE 1 TABLET EVERY DAY 90 tablet 3   Calcium -Vitamin D-Vitamin K 750-500-40 MG-UNT-MCG TABS Take 1 capsule by mouth daily.     COLLAGEN PO Take 1 Scoop by mouth daily. BioTRUST Multi-collagen, 10 g bioactive daily     Flaxseed, Linseed, (FLAXSEED OIL PO) Take 1 tablet by mouth daily.     Loratadine  10 MG CAPS Take 1 capsule by mouth daily.     Magnesium  500 MG CAPS Take 1 capsule by mouth daily at 6 (  six) AM.      meclizine  (ANTIVERT ) 25 MG tablet Take 1 tablet (25 mg total) by mouth 3 (three) times daily as needed for dizziness. Caution of sedation 30 tablet 0   Misc Natural Products (TURMERIC CURCUMIN) CAPS Take 1 capsule by mouth daily. Theracurmin     Multiple Vitamins-Minerals (CENTRUM SILVER 50+WOMEN) TABS Take 1 capsule by mouth daily.     NONFORMULARY OR COMPOUNDED ITEM Azelaic Acid /Metronidazole/Ivermectin  cream pump     Omega-3 Fatty Acids (FISH OIL) 1000 MG CAPS Take 1 capsule by mouth daily.     vitamin E 200 UNIT capsule Take 200 Units by mouth daily.     fluticasone  (FLONASE ) 50 MCG/ACT nasal spray Place 2 sprays into both nostrils daily. (Patient not taking: Reported on 05/15/2023) 16 g 6   No current facility-administered medications for this visit.    Review of Systems  Constitutional:  Negative for appetite change, chills, fatigue and fever.  HENT:   Negative for hearing loss and voice change.   Eyes:  Negative for eye problems.  Respiratory:  Negative for chest tightness and cough.   Cardiovascular:  Negative for chest pain.  Gastrointestinal:  Negative for abdominal distention, abdominal pain and blood in stool.  Endocrine: Positive for hot flashes.  Genitourinary:  Negative for difficulty urinating and frequency.   Musculoskeletal:  Positive for arthralgias.  Skin:  Negative for itching and rash.  Neurological:  Negative for extremity weakness.  Hematological:  Negative for adenopathy.  Psychiatric/Behavioral:  Negative for confusion.      PHYSICAL EXAMINATION: ECOG PERFORMANCE STATUS: 0 - Asymptomatic  Vitals:   11/13/23 1150  BP: 125/78  Pulse: 74  Resp: 18  Temp: 97.7 F (36.5 C)  SpO2: 100%   Filed Weights   11/13/23 1150  Weight: 212 lb 3.2 oz (96.3 kg)    Physical Exam Constitutional:      General: She is not in acute distress.    Appearance: She is not diaphoretic.  HENT:     Head: Normocephalic and atraumatic.     Mouth/Throat:     Pharynx: No  oropharyngeal exudate.  Eyes:     General: No scleral icterus. Cardiovascular:     Rate and Rhythm: Normal rate.  Pulmonary:     Effort: Pulmonary effort is normal. No respiratory distress.  Abdominal:     General: There is no distension.     Palpations: Abdomen is soft.  Musculoskeletal:        General: Normal range of motion.     Cervical back: Normal range of motion and neck supple.  Skin:    Findings: No erythema.  Neurological:     Mental Status: She is alert and oriented to person, place, and time. Mental status is at baseline.     Cranial Nerves: No cranial nerve deficit.     Motor: No abnormal muscle tone.  Psychiatric:        Mood and Affect: Mood and affect normal.    LABORATORY DATA:  I have reviewed the data as listed    Latest Ref Rng & Units 11/13/2023   11:36 AM 05/15/2023   11:09 AM 01/19/2023    8:55 AM  CBC  WBC 4.0 - 10.5 K/uL 6.2  4.7  4.4   Hemoglobin 12.0 - 15.0 g/dL 16.1  09.6  04.5   Hematocrit 36.0 - 46.0 % 40.5  43.2  43.4   Platelets 150 - 400 K/uL 206  175  197.0  Latest Ref Rng & Units 11/13/2023   11:36 AM 05/15/2023   11:09 AM 01/19/2023    8:55 AM  CMP  Glucose 70 - 99 mg/dL 657  92  846   BUN 8 - 23 mg/dL 25  13  24    Creatinine 0.44 - 1.00 mg/dL 9.62  9.52  8.41   Sodium 135 - 145 mmol/L 139  140  139   Potassium 3.5 - 5.1 mmol/L 4.8  3.8  4.3   Chloride 98 - 111 mmol/L 104  102  103   CO2 22 - 32 mmol/L 26  26  27    Calcium  8.9 - 10.3 mg/dL 9.4  9.5  9.4   Total Protein 6.5 - 8.1 g/dL 6.2  7.3  6.4   Total Bilirubin 0.0 - 1.2 mg/dL 0.9  1.0  0.9   Alkaline Phos 38 - 126 U/L 72  64  74   AST 15 - 41 U/L 23  21  18    ALT 0 - 44 U/L 20  19  15       RADIOGRAPHIC STUDIES: I have personally reviewed the radiological images as listed and agreed with the findings in the report. No results found.

## 2023-12-07 ENCOUNTER — Telehealth: Payer: Self-pay

## 2023-12-07 NOTE — Telephone Encounter (Signed)
 lvm for pt to call office to schedule appt.

## 2023-12-07 NOTE — Telephone Encounter (Signed)
 Copied from CRM [id number]. Topic: Clinical - Request for Lab/Test Order >> Dec 07, 2023  8:58 AM Michele Cruz wrote: Reason for CRM: Patient is requesting labs prior to her physical exam in August. Requesting a follow up to schedule once orders are placed.

## 2023-12-08 NOTE — Telephone Encounter (Signed)
 Spoke to pt, sch cpe labs for 01/27/24

## 2024-01-14 DIAGNOSIS — L728 Other follicular cysts of the skin and subcutaneous tissue: Secondary | ICD-10-CM | POA: Diagnosis not present

## 2024-01-14 DIAGNOSIS — L718 Other rosacea: Secondary | ICD-10-CM | POA: Diagnosis not present

## 2024-01-24 ENCOUNTER — Telehealth: Payer: Self-pay | Admitting: Family Medicine

## 2024-01-24 DIAGNOSIS — E7800 Pure hypercholesterolemia, unspecified: Secondary | ICD-10-CM

## 2024-01-24 DIAGNOSIS — E669 Obesity, unspecified: Secondary | ICD-10-CM

## 2024-01-24 DIAGNOSIS — R7309 Other abnormal glucose: Secondary | ICD-10-CM

## 2024-01-24 NOTE — Telephone Encounter (Signed)
-----   Message from Veva JINNY Ferrari sent at 01/12/2024 11:22 AM EDT ----- Regarding: Lab orders for Wed, 8.6.25 Patient is scheduled for CPX labs, please order future labs, Thanks , Veva

## 2024-01-27 ENCOUNTER — Ambulatory Visit: Payer: Self-pay | Admitting: Family Medicine

## 2024-01-27 ENCOUNTER — Other Ambulatory Visit (INDEPENDENT_AMBULATORY_CARE_PROVIDER_SITE_OTHER)

## 2024-01-27 DIAGNOSIS — R7309 Other abnormal glucose: Secondary | ICD-10-CM | POA: Diagnosis not present

## 2024-01-27 DIAGNOSIS — E7800 Pure hypercholesterolemia, unspecified: Secondary | ICD-10-CM | POA: Diagnosis not present

## 2024-01-27 LAB — LIPID PANEL
Cholesterol: 206 mg/dL — ABNORMAL HIGH (ref 0–200)
HDL: 64.7 mg/dL (ref >39.00)
LDL Cholesterol: 128 mg/dL — ABNORMAL HIGH (ref 0–99)
NonHDL: 141.78
Total CHOL/HDL Ratio: 3
Triglycerides: 70 mg/dL (ref 0.0–149.0)
VLDL: 14 mg/dL (ref 0.0–40.0)

## 2024-01-27 LAB — TSH: TSH: 3.64 u[IU]/mL (ref 0.35–5.50)

## 2024-01-27 LAB — COMPREHENSIVE METABOLIC PANEL WITH GFR
ALT: 14 U/L (ref 0–35)
AST: 22 U/L (ref 0–37)
Albumin: 4.1 g/dL (ref 3.5–5.2)
Alkaline Phosphatase: 77 U/L (ref 39–117)
BUN: 17 mg/dL (ref 6–23)
CO2: 29 meq/L (ref 19–32)
Calcium: 9.4 mg/dL (ref 8.4–10.5)
Chloride: 105 meq/L (ref 96–112)
Creatinine, Ser: 0.89 mg/dL (ref 0.40–1.20)
GFR: 67.03 mL/min (ref >60.00)
Glucose, Bld: 89 mg/dL (ref 70–99)
Potassium: 4.4 meq/L (ref 3.5–5.1)
Sodium: 141 meq/L (ref 135–145)
Total Bilirubin: 0.7 mg/dL (ref 0.2–1.2)
Total Protein: 6.3 g/dL (ref 6.0–8.3)

## 2024-01-27 LAB — HEMOGLOBIN A1C: Hgb A1c MFr Bld: 5.6 % (ref 4.6–6.5)

## 2024-02-03 ENCOUNTER — Ambulatory Visit (INDEPENDENT_AMBULATORY_CARE_PROVIDER_SITE_OTHER): Admitting: Family Medicine

## 2024-02-03 ENCOUNTER — Encounter: Payer: Self-pay | Admitting: Family Medicine

## 2024-02-03 VITALS — BP 118/70 | HR 68 | Temp 98.6°F | Ht 66.25 in | Wt 213.0 lb

## 2024-02-03 DIAGNOSIS — Z0000 Encounter for general adult medical examination without abnormal findings: Secondary | ICD-10-CM | POA: Diagnosis not present

## 2024-02-03 DIAGNOSIS — E669 Obesity, unspecified: Secondary | ICD-10-CM | POA: Diagnosis not present

## 2024-02-03 DIAGNOSIS — E7800 Pure hypercholesterolemia, unspecified: Secondary | ICD-10-CM

## 2024-02-03 DIAGNOSIS — R7309 Other abnormal glucose: Secondary | ICD-10-CM | POA: Diagnosis not present

## 2024-02-03 DIAGNOSIS — Z79811 Long term (current) use of aromatase inhibitors: Secondary | ICD-10-CM | POA: Diagnosis not present

## 2024-02-03 NOTE — Assessment & Plan Note (Signed)
 Lab Results  Component Value Date   HGBA1C 5.6 01/27/2024   HGBA1C 5.3 01/19/2023   disc imp of low glycemic diet and wt loss to prevent DM2

## 2024-02-03 NOTE — Assessment & Plan Note (Signed)
 Will continue to watch bone density  Good exercise habits commended

## 2024-02-03 NOTE — Assessment & Plan Note (Signed)
 Reviewed health habits including diet and exercise and skin cancer prevention Reviewed appropriate screening tests for age  Also reviewed health mt list, fam hx and immunization status , as well as social and family history   See HPI Labs reviewed and ordered Health Maintenance  Topic Date Due   Hepatitis C Screening  Never done   Medicare Annual Wellness Visit  01/29/2024   Flu Shot  09/20/2024*   COVID-19 Vaccine (4 - 2024-25 season) 02/18/2026*   Mammogram  08/11/2025   Colon Cancer Screening  03/01/2026   DTaP/Tdap/Td vaccine (4 - Td or Tdap) 05/21/2032   Pneumococcal Vaccine for age over 72  Completed   DEXA scan (bone density measurement)  Completed   Zoster (Shingles) Vaccine  Completed   Hepatitis B Vaccine  Aged Out   HPV Vaccine  Aged Out   Meningitis B Vaccine  Aged Out  *Topic was postponed. The date shown is not the original due date.    [facility] breast cancer care  Discussed fall prevention, supplements and exercise for bone density  Commended good exercise  [facility] dermatology care  PHQ 0

## 2024-02-03 NOTE — Patient Instructions (Addendum)
 Try to get most of your carbohydrates from produce (with the exception of white potatoes) and whole grains Eat less bread/pasta/rice/snack foods/cereals/sweets and other items from the middle of the grocery store (processed carbs) Lots of lean protein  The following are examples of protein in diet  Meat -lean  Fish  Eggs  Dairy products  Soy products  Oat milk  Almond milk Legumes  Nuts and nut butters  Dried beans    Labs look stable   Keep exercising

## 2024-02-03 NOTE — Assessment & Plan Note (Signed)
 Disc goals for lipids and reasons to control them Rev last labs with pt Rev low sat fat diet in detail LDL is improved to 128 Continue to follow Encouraged to eat less beef

## 2024-02-03 NOTE — Progress Notes (Signed)
 Subjective:    Patient ID: Michele Cruz, female    DOB: [birth year], 68 y.o.   MRN: [id number]  HPI  Here for health maintenance exam and to review chronic medical problems   Wt Readings from Last 3 Encounters:  02/03/24 213 lb (96.6 kg)  11/13/23 212 lb 3.2 oz (96.3 kg)  09/14/23 216 lb (98 kg)   34.12 kg/m  Vitals:   02/03/24 0927  BP: 118/70  Pulse: 68  Temp: 98.6 F (37 C)  SpO2: 97%    Immunization History  Administered Date(s) Administered   Fluad Quad(high Dose 65+) 02/21/2022   Influenza Inj Mdck Quad Pf 04/05/2020, 04/05/2021   Influenza Split 03/27/2011   Influenza Whole 06/23/2005, 03/24/2007, 04/02/2010   Influenza, Quadrivalent, Recombinant, Inj, Pf 03/23/2017   Influenza,inj,Quad PF,6+ Mos 04/15/2015, 03/30/2018, 01/28/2019   Influenza-Unspecified 03/23/2013   PFIZER Comirnaty(Gray Top)Covid-19 Tri-Sucrose Vaccine 09/28/2020   PFIZER(Purple Top)SARS-COV-2 Vaccination 09/19/2019, 10/12/2019   PNEUMOCOCCAL CONJUGATE-20 01/26/2023   Pneumococcal Polysaccharide-23 07/30/2018, 07/28/2021   Respiratory Syncytial Virus Vaccine,Recomb Aduvanted(Arexvy) 05/21/2022   Td 04/15/2002, 01/24/2020   Tdap 05/21/2022   Zoster Recombinant(Shingrix) 01/28/2019, 04/25/2019    Health Maintenance Due  Topic Date Due   Hepatitis C Screening  Never done   Medicare Annual Wellness (AWV)  01/29/2024   Doing well overall   Mammogram 07/2023  Personal history of breast cancer taking anastrozole   DCIS ER pos Self breast exam= no lumps  Had MRI also   Gyn health No problems   Colon cancer screening -colonoscopy 02/2019  7 year recall   Bone health  Dexa  09/2022 -normal  Taking anastrozole  for breast cancer  Falls-one fall on a trail last November - tripped on uneven surface and dislocated 2 joints in right hand (no fractures)  Fractures-none Supplements ca and D -chewable   Exercise  Doing more strength training and feels stronger  Free weights and kettle bells   Core work also  Can do a 2 minute plank    Going to a clinic in [place] and had a dexa for fat analysis  Doing more strength training and it helped   Dermatology care  No new moles or skin cancer  Has a cyst that needs to be removed soon on back of neck  Annual check is in December  Uses sunscreen clothing and avoids sun at peak times     Mood    02/03/2024    9:32 AM 01/29/2023    8:42 AM 01/26/2023    9:07 AM 11/28/2022    8:35 AM 01/23/2021   10:34 AM  Depression screen PHQ 2/9  Decreased Interest 0 0 1 1 1   Down, Depressed, Hopeless 0 0 0 0 1  PHQ - 2 Score 0 0 1 1 2   Altered sleeping 1 0 0 1 1  Tired, decreased energy 2 0 1 1 2   Change in appetite 0 0 1 1 1   Feeling bad or failure about yourself  1 0 0 1 1  Trouble concentrating 0 0 0 0 1  Moving slowly or fidgety/restless 0 0 0 0 0  Suicidal thoughts 0 0 0 0 0  PHQ-9 Score 4 0 3 5 8   Difficult doing work/chores Somewhat difficult Not difficult at all Somewhat difficult Somewhat difficult Somewhat difficult    Glucose Lab Results  Component Value Date   HGBA1C 5.6 01/27/2024   HGBA1C 5.3 01/19/2023   Has sugar cravings    Hyperlipidemia Lab Results  Component Value Date  CHOL 206 (H) 01/27/2024   CHOL 228 (H) 01/19/2023   CHOL 198 01/17/2022   Lab Results  Component Value Date   HDL 64.70 01/27/2024   HDL 61.00 01/19/2023   HDL 59.60 01/17/2022   Lab Results  Component Value Date   LDLCALC 128 (H) 01/27/2024   LDLCALC 152 (H) 01/19/2023   LDLCALC 126 (H) 01/17/2022   Lab Results  Component Value Date   TRIG 70.0 01/27/2024   TRIG 74.0 01/19/2023   TRIG 65.0 01/17/2022   Lab Results  Component Value Date   CHOLHDL 3 01/27/2024   CHOLHDL 4 01/19/2023   CHOLHDL 3 01/17/2022   Lab Results  Component Value Date   LDLDIRECT 152.4 03/27/2011   LDLDIRECT 150.6 03/20/2010   LDLDIRECT 150.4 12/11/2009   Did not tolerate low dose intermittent statin  No fried food  Some red meat  ? What she is  eating differently  Does not want to take zetia unless numbers worsen   The 10-year ASCVD risk score (Arnett DK, et al., 2019) is: 5.7%   Values used to calculate the score:     Age: 12 years     Clincally relevant sex: Female     Is Non-Hispanic African American: No     Diabetic: No     Tobacco smoker: No     Systolic Blood Pressure: 118 mmHg     Is BP treated: No     HDL Cholesterol: 64.7 mg/dL     Total Cholesterol: 206 mg/dL   Lab Results  Component Value Date   TSH 3.64 01/27/2024   Lab Results  Component Value Date   NA 141 01/27/2024   K 4.4 01/27/2024   CO2 29 01/27/2024   GLUCOSE 89 01/27/2024   BUN 17 01/27/2024   CREATININE 0.89 01/27/2024   CALCIUM  9.4 01/27/2024   GFR 67.03 01/27/2024   GFRNONAA >60 11/13/2023   Lab Results  Component Value Date   ALT 14 01/27/2024   AST 22 01/27/2024   ALKPHOS 77 01/27/2024   BILITOT 0.7 01/27/2024       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   Encounter for screening mammogram for breast cancer 01/24/2022   Estrogen deficiency 01/24/2022   Internal and external bleeding hemorrhoids 08/16/2021   Total knee replacement status 07/28/2018   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   Internal hemorrhoids 07/21/2017   Hydradenitis 07/21/2017   Obesity (BMI 30-39.9) 12/09/2016   Encounter for routine gynecological examination 01/10/2014   Routine general medical examination at a health care facility 03/25/2011   HYPERCHOLESTEROLEMIA 12/11/2009   History of colonic polyps 08/03/2009   HERNIATED CERVICAL DISC 06/20/2009   Herniated cervical disc 06/20/2009   BACK PAIN, LUMBAR, WITH RADICULOPATHY 10/13/2008   Generalized anxiety disorder 05/24/2007   Allergic rhinitis 05/24/2007   ROSACEA 05/24/2007   CARDIAC MURMUR 05/24/2007    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   Family history of adverse reaction to anesthesia  sister PONV   Heart murmur    Heavy menses    Hemorrhoids    Hidradenitis suppurativa    History of exercise stress test 06/1999   neg   History of methicillin resistant staphylococcus aureus (MRSA) 2019   + surgical pcr swab   History of MRI 2003   neg per patient   History of MRI 2007   right quad fatty defect   History of MRI of spine 03/2004   C-5, C5-6 disk protrusion   Hyperlipidemia    LDL   Rosacea    Spondylosis    deg lumbar disc dz   Wisdom teeth removed    Zoster    Past Surgical History:  Procedure Laterality Date   BREAST BIOPSY Right 03/28/2022   bre[facility] [place]   BREAST CYST ASPIRATION  03/2002   BREAST CYST ASPIRATION  03/2004   left   BREAST LUMPECTOMY WITH RADIOFREQUENCY TAG IDENTIFICATION Right 05/07/2022   Procedure: BREAST LUMPECTOMY WITH RADIOFREQUENCY TAG IDENTIFICATION;  Surgeon: Lane Shope, MD;  Location: [facility];  Service: General;  Laterality: Right;   CATARACT EXTRACTION Bilateral    CESAREAN SECTION     COLONOSCOPY  01/26/2012   EYE SURGERY  7/12&26/2021   Cataracts   HEMORRHOID BANDING  08/2021   JOINT REPLACEMENT  03/15/2018 & 07/28/2018   TKR left & right   KNEE ARTHROPLASTY Left 03/15/2018   Procedure: COMPUTER ASSISTED TOTAL KNEE ARTHROPLASTY;  Surgeon: Mardee Lynwood SQUIBB, MD;  Location: [facility];  Service: Orthopedics;  Laterality: Left;   KNEE ARTHROPLASTY Right 07/28/2018   Procedure: COMPUTER ASSISTED TOTAL KNEE ARTHROPLASTY;  Surgeon: Mardee Lynwood SQUIBB, MD;  Location: [facility];  Service: Orthopedics;  Laterality: Right;   KNEE SURGERY Right    Social History   Tobacco Use    Smoking status: Never    Passive exposure: Never   Smokeless tobacco: Never  Vaping Use   Vaping status: Never Used  Substance Use Topics   Alcohol  use: Not Currently    Alcohol /week: 4.0 standard drinks of alcohol     Comment: 1-2 drinks/month   Drug use: No   Family History  Problem Relation Age of Onset   Arthritis Mother        OA   Obesity Mother    Diverticulitis Mother    Colon polyps Mother    Hypertension Mother    Heart disease Father        CAD   Alcohol  abuse Father    Diabetes Father    Hyperlipidemia Sister    Diverticulitis Sister    Diabetes Other    Diverticulitis Sister    Diabetes Maternal Grandmother    Hyperlipidemia Paternal Grandmother    Asthma Sister    Hyperlipidemia Sister    Colon cancer Neg Hx    Breast cancer Neg Hx    Esophageal cancer Neg Hx    Rectal cancer Neg Hx    Stomach cancer Neg Hx    Allergies  Allergen Reactions   Decadron  [Dexamethasone ] Other (See Comments)    Unsure if related---swelling of throat   Crestor  [Rosuvastatin ]     Muscle pain    Depo-Medrol [Methylprednisolone Acetate]     Rash after injection, a noted common SE from depo-medrol listed in epocrates and most resources   Lipitor [Atorvastatin  Calcium ] Other (See Comments)    Leg cramps and pain   Meloxicam     Pt felt dizzy potentially from this, but does well on aleve   Simvastatin  Possible transient memory changes, resolved off medicine. Muscle pain   Current Outpatient Medications on File Prior to Visit  Medication Sig Dispense Refill   anastrozole  (ARIMIDEX ) 1 MG tablet TAKE 1 TABLET EVERY DAY 90 tablet 3   Calcium  Carbonate (CALTRATE 600 PO) Take 1-2 capsules by mouth daily.     Cholecalciferol (VITAMIN D3) 25 MCG (1000 UT) CAPS Take 1 capsule by mouth daily.     cyanocobalamin (VITAMIN B12) 1000 MCG tablet Take 1,000 mcg by mouth daily.     Flaxseed, Linseed, (FLAXSEED OIL PO) Take 1 tablet by mouth daily.     Loratadine  10 MG CAPS Take 1  capsule by mouth daily.     Magnesium  500 MG CAPS Take 1 capsule by mouth daily at 6 (six) AM.     Multiple Vitamins-Minerals (CENTRUM SILVER 50+WOMEN) TABS Take 1 capsule by mouth daily.     Niacinamide (VITAMIN B3 PO) Take 500 mg by mouth daily.     NONFORMULARY OR COMPOUNDED ITEM Azelaic Acid /Metronidazole/Ivermectin  cream pump     Omega-3 Fatty Acids (FISH OIL) 1200 MG CAPS Take 1 capsule by mouth daily.     Povidone (IVIZIA DRY EYES OP) Apply 1-2 drops to eye in the morning and at bedtime.     TURMERIC CURCUMIN PO Take 30 mg by mouth daily.     vitamin E 200 UNIT capsule Take 200 Units by mouth daily. (Patient taking differently: Take 400 Units by mouth daily.)     No current facility-administered medications on file prior to visit.    Review of Systems  Constitutional:  Negative for activity change, appetite change, fatigue, fever and unexpected weight change.  HENT:  Negative for congestion, ear pain, rhinorrhea, sinus pressure and sore throat.   Eyes:  Negative for pain, redness and visual disturbance.  Respiratory:  Negative for cough, shortness of breath and wheezing.   Cardiovascular:  Negative for chest pain and palpitations.  Gastrointestinal:  Negative for abdominal pain, blood in stool, constipation and diarrhea.  Endocrine: Negative for polydipsia and polyuria.  Genitourinary:  Negative for dysuria, frequency and urgency.  Musculoskeletal:  Negative for arthralgias, back pain and myalgias.  Skin:  Negative for pallor and rash.  Allergic/Immunologic: Negative for environmental allergies.  Neurological:  Negative for dizziness, syncope and headaches.  Hematological:  Negative for adenopathy. Does not bruise/bleed easily.  Psychiatric/Behavioral:  Negative for decreased concentration and dysphoric mood. The patient is not nervous/anxious.        Objective:   Physical Exam Constitutional:      General: She is not in acute distress.    Appearance: Normal appearance. She  is well-developed. She is obese. She is not ill-appearing or diaphoretic.  HENT:     Head: Normocephalic and atraumatic.     Right Ear: Tympanic membrane, ear canal and external ear normal.     Left Ear: Tympanic membrane, ear canal and external ear normal.     Nose: Nose normal. No congestion.     Mouth/Throat:     Mouth: Mucous membranes are moist.     Pharynx: Oropharynx is clear. No posterior oropharyngeal erythema.  Eyes:     General: No scleral icterus.    Extraocular Movements: Extraocular movements intact.     Conjunctiva/sclera: Conjunctivae normal.     Pupils: Pupils are equal, round, and reactive to light.  Neck:     Thyroid : No thyromegaly.     Vascular: No carotid bruit or JVD.  Cardiovascular:     Rate and  Rhythm: Normal rate and regular rhythm.     Pulses: Normal pulses.     Heart sounds: Normal heart sounds.     No gallop.  Pulmonary:     Effort: Pulmonary effort is normal. No respiratory distress.     Breath sounds: Normal breath sounds. No wheezing.     Comments: Good air exch Chest:     Chest wall: No tenderness.  Abdominal:     General: Bowel sounds are normal. There is no distension or abdominal bruit.     Palpations: Abdomen is soft. There is no mass.     Tenderness: There is no abdominal tenderness.     Hernia: No hernia is present.  Genitourinary:    Comments: Breast exam: No mass, nodules, thickening, tenderness, bulging, retraction, inflamation, nipple discharge or skin changes noted.  No axillary or clavicular LA.     Baseline surgical changes  Musculoskeletal:        General: No tenderness. Normal range of motion.     Cervical back: Normal range of motion and neck supple. No rigidity. No muscular tenderness.     Right lower leg: No edema.     Left lower leg: No edema.     Comments: No kyphosis   Lymphadenopathy:     Cervical: No cervical adenopathy.  Skin:    General: Skin is warm and dry.     Coloration: Skin is not pale.     Findings: No  erythema or rash.     Comments: Solar lentigines diffusely Some scattered sks   Neurological:     Mental Status: She is alert. Mental status is at baseline.     Cranial Nerves: No cranial nerve deficit.     Motor: No abnormal muscle tone.     Coordination: Coordination normal.     Gait: Gait normal.     Deep Tendon Reflexes: Reflexes are normal and symmetric. Reflexes normal.  Psychiatric:        Mood and Affect: Mood normal.        Cognition and Memory: Cognition and memory normal.           Assessment & Plan:   Problem List Items Addressed This Visit       Other   Aromatase inhibitor use (Chronic)   Will continue to watch bone density  Good exercise habits commended       Routine general medical examination at a health care facility - Primary   Reviewed health habits including diet and exercise and skin cancer prevention Reviewed appropriate screening tests for age  Also reviewed health mt list, fam hx and immunization status , as well as social and family history   See HPI Labs reviewed and ordered Health Maintenance  Topic Date Due   Hepatitis C Screening  Never done   Medicare Annual Wellness Visit  01/29/2024   Flu Shot  09/20/2024*   COVID-19 Vaccine (4 - 2024-25 season) 02/18/2026*   Mammogram  08/11/2025   Colon Cancer Screening  03/01/2026   DTaP/Tdap/Td vaccine (4 - Td or Tdap) 05/21/2032   Pneumococcal Vaccine for age over 29  Completed   DEXA scan (bone density measurement)  Completed   Zoster (Shingles) Vaccine  Completed   Hepatitis B Vaccine  Aged Out   HPV Vaccine  Aged Out   Meningitis B Vaccine  Aged Out  *Topic was postponed. The date shown is not the original due date.    [facility] breast cancer care  Discussed fall prevention, supplements  and exercise for bone density  Commended good exercise  [facility] dermatology care  PHQ 0        Obesity (BMI 30-39.9)   Discussed how this problem influences overall health and the risks it imposes   Reviewed plan for weight loss with lower calorie diet (via better food choices (lower glycemic and portion control) along with exercise building up to or more than 30 minutes 5 days per week including some aerobic activity and strength training    Commended work done so far       [employer] goals for lipids and reasons to control them Rev last labs with pt Rev low sat fat diet in detail LDL is improved to 128 Continue to follow Encouraged to eat less beef       Elevated glucose level   Lab Results  Component Value Date   HGBA1C 5.6 01/27/2024   HGBA1C 5.3 01/19/2023   disc imp of low glycemic diet and wt loss to prevent DM2

## 2024-02-03 NOTE — Assessment & Plan Note (Signed)
 Discussed how this problem influences overall health and the risks it imposes  Reviewed plan for weight loss with lower calorie diet (via better food choices (lower glycemic and portion control) along with exercise building up to or more than 30 minutes 5 days per week including some aerobic activity and strength training    Commended work done so far

## 2024-02-10 ENCOUNTER — Ambulatory Visit
Admission: RE | Admit: 2024-02-10 | Discharge: 2024-02-10 | Disposition: A | Discharge status: Home / Self Care | Source: Ambulatory Visit | Attending: Oncology | Admitting: Oncology

## 2024-02-10 DIAGNOSIS — D0511 Intraductal carcinoma in situ of right breast: Secondary | ICD-10-CM | POA: Diagnosis not present

## 2024-02-10 DIAGNOSIS — L720 Epidermal cyst: Secondary | ICD-10-CM | POA: Diagnosis not present

## 2024-02-10 DIAGNOSIS — R92343 Mammographic extreme density, bilateral breasts: Secondary | ICD-10-CM | POA: Diagnosis not present

## 2024-02-10 HISTORY — DX: Personal history of irradiation: Z92.3

## 2024-02-17 NOTE — Progress Notes (Unsigned)
 [facility] POST-OP OFFICE VISIT  02/18/2024  HPI: Michele Cruz is a 68 y.o. female right breast excisional biopsy, RFID tag guided.  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  Pathology report after excision noted below. We discussed the continued role of MRI screening and follow-up.  We discussed how she is tolerating the side effects of her anastrozole . Patient denies any significant pain or issues with her right breast lumpectomy.    She appears to be more content with her current breast conservation choice.  Vital signs: BP (!) 145/86   Pulse 73   Temp 98.2 F (36.8 C) (Oral)   Ht 5' 6.5 (1.689 m)   Wt 211 lb (95.7 kg)   SpO2 97%   BMI 33.55 kg/m    Physical Exam: Constitutional: She appears quite well. Skin: Right lateral breast scar incision appears to be slightly contracted along the length, no underlying densities or dermal changes of concern.  Both breasts are without suspicious or dominant nodularity.  Skin looks quite normal-appearing on the right c/w left.  CLINICAL DATA:  Status post RIGHT lumpectomy in November 2023. Stereotactic guided biopsy for calcifications demonstrated DCIS. Surgical excision revealed LCIS. Status post radiation. On anastrozole .   EXAM:  CLINICAL DATA:  Status post RIGHT breast lumpectomy in November 2023 for DCIS. Pathology also showed LCIS. Received radiation. On anastrozole . For annual. Negative MRI in February 2025.   EXAM: DIGITAL DIAGNOSTIC BILATERAL MAMMOGRAM WITH TOMOSYNTHESIS AND CAD   TECHNIQUE: Bilateral digital diagnostic mammography and breast tomosynthesis was performed. The images were evaluated with computer-aided detection.   COMPARISON:  Previous exam(s).   ACR Breast Density Category d: The breasts are extremely dense, which lowers the sensitivity of mammography.   FINDINGS: There is density and architectural distortion within the RIGHT breast, corresponding to the site of prior lumpectomy.  Spot compression magnification view(s) demonstrate no evidence of recurrent malignancy. Findings are stable compared to prior. There is no mammographic evidence of malignancy in EITHER breast.   IMPRESSION: Stable appearance of the RIGHT breast lumpectomy site without evidence of malignancy in EITHER breast.   RECOMMENDATION: Continued annual screening MRI due in February 2026. Diagnostic mammogram of the BILATERAL breasts in 1 year to complete 2 years of diagnostic mammogram surveillance status post RIGHT breast lumpectomy.   I have discussed the findings and recommendations with the patient. If applicable, a reminder letter will be sent to the patient regarding the next appointment.   BI-RADS CATEGORY  2: Benign.     Electronically Signed   By: Norleen Croak M.D.   On: 02/10/2024 14:30  CLINICAL DATA:  History of right breast cancer status post lumpectomy in 2023.   EXAM: BILATERAL BREAST MRI WITH AND WITHOUT CONTRAST   TECHNIQUE: Multiplanar, multisequence MR images of both breasts were obtained prior to and following the intravenous administration of 10 ml of Gadavist    Three-dimensional MR images were rendered by post-processing of the original MR data on an independent workstation. The three-dimensional MR images were interpreted, and findings are reported in the following complete MRI report for this study. Three dimensional images were evaluated at the independent interpreting workstation using the DynaCAD thin client.   COMPARISON:  Previous exam(s) including mammogram dated 02/09/2023.   FINDINGS: Breast composition: d. Extreme fibroglandular tissue.   Background parenchymal enhancement: Mild   Right breast: Lumpectomy changes with a postoperative seroma in the upper-outer quadrant of the right breast. No mass or abnormal enhancement.   Left breast: No mass or abnormal enhancement.  Lymph nodes: No abnormal appearing lymph nodes.   Ancillary  findings:  None.   IMPRESSION: No MRI evidence of malignancy in either breast.   RECOMMENDATION: Bilateral diagnostic mammogram in August of 2025 is recommended.   Annual screening MRI should be considered given the patient's history of right breast cancer and extremely dense fibroglandular pattern.   Based on the recommendations of the American Cancer Society, annual screening MRI is suggested in addition to annual mammography if the patient has an estimated lifetime risk of developing breast cancer which is greater than 20%.   BI-RADS CATEGORY  2: Benign.     Electronically Signed   By: Dina  Arceo M.D.   On: 08/12/2023 11:22 SURGICAL PATHOLOGY CASE: [phone] PATIENT: [facility] Surgical [facility]ology Report  Specimen Submitted: A. 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 - CLIP AND BIOPSY SITE IDENTIFIED. - NEGATIVE FOR DUCTAL CARCINOMA IN SITU AND INVASIVE CARCINOMA.  Comment: Sections show an extensive intraepithelial proliferation, involving and expanding both ducts and lobular units, throughout nearly all sampling of the submitted breast tissue. Focal areas display cytomorphologic features more suggestive of lobular differentiation, and as such, E-cadherin stains were performed on multiple selected tissue blocks, including areas showing central comedo-type necrosis. The abnormal proliferation displays significantly reduced, to completely absent marking for E-cadherin throughout, confirming lobular differentiation. Background benign ductal elements show appropriate strong and membranous internal control staining. This proliferation extends to multiple surgical margins (posterior, lateral, and medial), although the significance of this is unclear.  Assessment/Plan: This  is a 68 y.o. female  s/p right breast excisional biopsy for DCIS, however has component of LCIS/DCIS with comedonecrosis.  There were lobular proliferation at margins of excisional biopsy.  She has completed radiation treatment, and is taking her medications faithfully despite side effects.  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   Encounter for screening mammogram for breast cancer 01/24/2022   Estrogen deficiency 01/24/2022   Internal and external bleeding hemorrhoids 08/16/2021   Total knee replacement status 07/28/2018   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   Internal hemorrhoids 07/21/2017   Hydradenitis 07/21/2017   Obesity (BMI 30-39.9) 12/09/2016   Encounter for routine gynecological examination 01/10/2014   Routine general medical examination at a health care facility 03/25/2011   HYPERCHOLESTEROLEMIA 12/11/2009   History of colonic polyps 08/03/2009   HERNIATED CERVICAL DISC 06/20/2009   Herniated cervical disc 06/20/2009   BACK PAIN, LUMBAR, WITH RADICULOPATHY 10/13/2008   Generalized anxiety disorder 05/24/2007   Allergic rhinitis 05/24/2007   ROSACEA 05/24/2007   CARDIAC MURMUR 05/24/2007    Continued annual screening MRI due in February 2026. Diagnostic mammogram of the BILATERAL breasts in 1 year to complete 2 years of diagnostic mammogram surveillance status post RIGHT breast lumpectomy. Follow-up in a year or as needed.  I personally spent a total of 30 minutes in the care of the patient today including preparing to see the patient, getting/reviewing separately obtained history, performing a medically appropriate exam/evaluation, counseling and educating, documenting clinical information in the EHR, and independently interpreting results.   Honor Leghorn M.D.,  FACS 02/18/2024, 1:53 PM

## 2024-02-18 ENCOUNTER — Encounter: Payer: Self-pay | Admitting: Surgery

## 2024-02-18 ENCOUNTER — Ambulatory Visit: Admitting: Surgery

## 2024-02-18 VITALS — BP 145/86 | HR 73 | Temp 98.2°F | Ht 66.5 in | Wt 211.0 lb

## 2024-02-18 DIAGNOSIS — D0511 Intraductal carcinoma in situ of right breast: Secondary | ICD-10-CM | POA: Diagnosis not present

## 2024-02-18 DIAGNOSIS — Z09 Encounter for follow-up examination after completed treatment for conditions other than malignant neoplasm: Secondary | ICD-10-CM | POA: Diagnosis not present

## 2024-02-18 NOTE — Patient Instructions (Addendum)
 We will see you back in 6 months, our schedule is not available so we placed you in our recall system and will send you out a letter with your appointment information    How to Do a Breast Self-Exam Doing breast self-exams can help you stay healthy. They're one way to learn what's normal for your breasts. You should check your breasts often and tell your health care provider about any changes. Sometimes, changes aren't harmful. Other times, a change may be a sign of a serious medical problem. Breast self-exams can help you catch a problem while it's still small and can be treated. You should do breast self-exams even if you have breast implants. What you need: A mirror. A well-lit room. A pillow or other soft object. How to do a breast self-exam Look for changes  Take off all the clothing above your waist. Stand in front of a mirror in a room with good lighting. Put your hands down at your sides. Compare your breasts in the mirror. Look for differences between them, such as: Differences in shape. Differences in size. Puckers, dips, and bumps in one breast and not the other. Look at each breast for skin changes, such as: Redness. Scaly spots. Spots where your skin is thicker. Dimpling. Open sores. Look for changes in your nipples, such as: Discharge. Bleeding. Dimpling. Redness. A nipple that looks pushed in or that has changed position. Feel for changes Gently feel your breasts for lumps and changes. It's best to do this while lying down. Follow these steps to feel each breast: Place a pillow under the shoulder of one side of your body. Place the arm of that side of your body behind your head. Feel the breast of that side of your body using the hand of your other arm. To do this: Start near your nipple and use the pads of your three middle fingers to make -inch (2 cm) circles that overlap. Use light, medium, and then firm pressure as you feel your breast, gently going over the  whole breast area and armpit. Keep making circles, moving down over the breast until you feel your ribs below your breast. Then, make circles with your fingers going up until you reach your collarbone. Next, make circles by moving out across your breast and into your armpit area. Squeeze the nipple. Check for discharge and lumps. Repeat steps 1-7 to check your other breast. Sit or stand in the tub or shower. With soapy water  on your skin, feel each breast the same way you did when you were lying down. Write down what you find Writing down what you find can help you keep track of what you want to tell your provider. Write down: What's normal for each breast. Any changes that you find. Write down: The kind of change. If you feel any pain or tenderness. How big any lumps are. Where any lumps are. Where you are in your menstrual cycle, if you get a period. General tips If you're breastfeeding, the best time to check your breasts is after a feeding or after using a breast pump. If you get a period, the best time to check your breasts is 5-7 days after your period. Breasts are often lumpier during your period, and it may be harder to notice changes. With time and practice, you'll get more familiar with the differences in your breasts and get more used to doing the self-exam. Contact a health care provider if: You see a change in the shape  or size of your breasts or nipples. You see a change in the skin of your breast or nipples. You have discharge from your nipples. You find a new lump or thick area. You have breast pain. You have any concerns about your breast health. This information is not intended to replace advice given to you by your health care provider. Make sure you discuss any questions you have with your health care provider. Document Revised: 08/19/2023 Document Reviewed: 08/19/2023 Elsevier Patient Education  2025 [employer].

## 2024-03-25 ENCOUNTER — Emergency Department

## 2024-03-25 ENCOUNTER — Emergency Department
Admission: EM | Admit: 2024-03-25 | Discharge: 2024-03-25 | Disposition: A | Discharge status: Home / Self Care | Source: Other Acute Inpatient Hospital

## 2024-03-25 ENCOUNTER — Ambulatory Visit: Admission: EM | Admit: 2024-03-25 | Discharge: 2024-03-25 | Disposition: A | Discharge status: Home / Self Care

## 2024-03-25 ENCOUNTER — Other Ambulatory Visit: Payer: Self-pay

## 2024-03-25 DIAGNOSIS — L03113 Cellulitis of right upper limb: Secondary | ICD-10-CM

## 2024-03-25 DIAGNOSIS — E871 Hypo-osmolality and hyponatremia: Secondary | ICD-10-CM | POA: Diagnosis not present

## 2024-03-25 DIAGNOSIS — M7989 Other specified soft tissue disorders: Secondary | ICD-10-CM | POA: Diagnosis not present

## 2024-03-25 DIAGNOSIS — Z86000 Personal history of in-situ neoplasm of breast: Secondary | ICD-10-CM | POA: Insufficient documentation

## 2024-03-25 DIAGNOSIS — R509 Fever, unspecified: Secondary | ICD-10-CM

## 2024-03-25 DIAGNOSIS — R21 Rash and other nonspecific skin eruption: Secondary | ICD-10-CM | POA: Diagnosis present

## 2024-03-25 LAB — URINALYSIS, ROUTINE W REFLEX MICROSCOPIC
Bilirubin Urine: NEGATIVE
Glucose, UA: NEGATIVE mg/dL
Hgb urine dipstick: NEGATIVE
Ketones, ur: NEGATIVE mg/dL
Leukocytes,Ua: NEGATIVE
Nitrite: NEGATIVE
Protein, ur: NEGATIVE mg/dL
Specific Gravity, Urine: 1.002 — ABNORMAL LOW (ref 1.005–1.030)
pH: 6 (ref 5.0–8.0)

## 2024-03-25 LAB — COMPREHENSIVE METABOLIC PANEL WITH GFR
ALT: 18 U/L (ref 0–44)
AST: 27 U/L (ref 15–41)
Albumin: 3.6 g/dL (ref 3.5–5.0)
Alkaline Phosphatase: 56 U/L (ref 38–126)
Anion gap: 12 (ref 5–15)
BUN: 14 mg/dL (ref 8–23)
CO2: 23 mmol/L (ref 22–32)
Calcium: 8.8 mg/dL — ABNORMAL LOW (ref 8.9–10.3)
Chloride: 99 mmol/L (ref 98–111)
Creatinine, Ser: 0.76 mg/dL (ref 0.44–1.00)
GFR, Estimated: >60 mL/min (ref >60)
Glucose, Bld: 104 mg/dL — ABNORMAL HIGH (ref 70–99)
Potassium: 3.8 mmol/L (ref 3.5–5.1)
Sodium: 134 mmol/L — ABNORMAL LOW (ref 135–145)
Total Bilirubin: 1.1 mg/dL (ref 0.0–1.2)
Total Protein: 6.5 g/dL (ref 6.5–8.1)

## 2024-03-25 LAB — CBC WITH DIFFERENTIAL/PLATELET
Abs Immature Granulocytes: 0.01 K/uL (ref 0.00–0.07)
Basophils Absolute: 0 K/uL (ref 0.0–0.1)
Basophils Relative: 1 %
Eosinophils Absolute: 0 K/uL (ref 0.0–0.5)
Eosinophils Relative: 1 %
HCT: 43 % (ref 36.0–46.0)
Hemoglobin: 14.2 g/dL (ref 12.0–15.0)
Immature Granulocytes: 0 %
Lymphocytes Relative: 15 %
Lymphs Abs: 0.9 K/uL (ref 0.7–4.0)
MCH: 27.9 pg (ref 26.0–34.0)
MCHC: 33 g/dL (ref 30.0–36.0)
MCV: 84.5 fL (ref 80.0–100.0)
Monocytes Absolute: 0.4 K/uL (ref 0.1–1.0)
Monocytes Relative: 6 %
Neutro Abs: 4.8 K/uL (ref 1.7–7.7)
Neutrophils Relative %: 77 %
Platelets: 136 K/uL — ABNORMAL LOW (ref 150–400)
RBC: 5.09 MIL/uL (ref 3.87–5.11)
RDW: 15.3 % (ref 11.5–15.5)
WBC: 6.2 K/uL (ref 4.0–10.5)
nRBC: 0 % (ref 0.0–0.2)

## 2024-03-25 LAB — SEDIMENTATION RATE: Sed Rate: 35 mm/h — ABNORMAL HIGH (ref 0–30)

## 2024-03-25 LAB — LACTIC ACID, PLASMA: Lactic Acid, Venous: 1 mmol/L (ref 0.5–1.9)

## 2024-03-25 MED ORDER — FAMOTIDINE IN NACL 20-0.9 MG/50ML-% IV SOLN
20.0000 mg | Freq: Once | INTRAVENOUS | Status: AC
  Administered 2024-03-25: 20 mg via INTRAVENOUS
  Filled 2024-03-25: qty 50

## 2024-03-25 MED ORDER — VANCOMYCIN HCL IN DEXTROSE 1-5 GM/200ML-% IV SOLN
1000.0000 mg | Freq: Once | INTRAVENOUS | Status: AC
  Administered 2024-03-25: 1000 mg via INTRAVENOUS
  Filled 2024-03-25: qty 200

## 2024-03-25 MED ORDER — DOXYCYCLINE HYCLATE 100 MG PO TABS: 100.0000 mg | ORAL_TABLET | Freq: Two times a day (BID) | ORAL | 0 refills | Status: DC

## 2024-03-25 MED ORDER — METHYLPREDNISOLONE SODIUM SUCC 125 MG IJ SOLR
125.0000 mg | Freq: Once | INTRAMUSCULAR | Status: AC
  Administered 2024-03-25: 125 mg via INTRAVENOUS
  Filled 2024-03-25: qty 2

## 2024-03-25 MED ORDER — DIPHENHYDRAMINE HCL 50 MG/ML IJ SOLN
25.0000 mg | Freq: Once | INTRAMUSCULAR | Status: AC
  Administered 2024-03-25: 25 mg via INTRAVENOUS
  Filled 2024-03-25: qty 1

## 2024-03-25 MED ORDER — SODIUM CHLORIDE 0.9 % IV BOLUS
1000.0000 mL | Freq: Once | INTRAVENOUS | Status: AC
  Administered 2024-03-25: 1000 mL via INTRAVENOUS

## 2024-03-25 NOTE — ED Provider Notes (Signed)
 [facility] Provider Note    Event Date/Time   First MD Initiated Contact with Patient 03/25/24 1002     (approximate)   History   Fever   HPI  Michele Cruz is a 68 y.o. female with history of rosacea, ductal carcinoma in situ, hidradenitis, presents emergency department with a rash to the right arm.  Patient states it is slightly itchy.  Not severe.  Area has been warm.  Had a high fever the other day.  Today no fever.  Went to the urgent care and they were concerned due to the rash being on 1 arm and were afraid it might be cellulitis.  Patient arrives here today with a temperature of 99.2.  Cannot remember being exposed to any poison ivy, poison oak, dog bite, cat bite, abrasion.  Does going to woods quite a bit but has not seen a tick on her leg.      Physical Exam   Triage Vital Signs: ED Triage Vitals  Encounter Vitals Group     BP 03/25/24 0950 124/80     Girls Systolic BP Percentile --      Girls Diastolic BP Percentile --      Boys Systolic BP Percentile --      Boys Diastolic BP Percentile --      Pulse Rate 03/25/24 0950 92     Resp 03/25/24 0950 18     Temp 03/25/24 0950 99.2 F (37.3 C)     Temp Source 03/25/24 0950 Oral     SpO2 03/25/24 0950 100 %     Weight 03/25/24 0950 212 lb (96.2 kg)     Height 03/25/24 0950 5' 6.5 (1.689 m)     Head Circumference --      Peak Flow --      Pain Score 03/25/24 0953 4     Pain Loc --      Pain Education --      Exclude from Growth Chart --     Most recent vital signs: Vitals:   03/25/24 0950 03/25/24 1352  BP: 124/80 120/78  Pulse: 92 88  Resp: 18 18  Temp: 99.2 F (37.3 C) 98 F (36.7 C)  SpO2: 100% 100%     General: Awake, no distress.   CV:  Good peripheral perfusion. regular rate and  rhythm Resp:  Normal effort. Lungs cta Abd:  No distention.   Other:  Patchy red areas noted along the entire right arm, warm to touch, no pustules, no vesicles, or intact   ED Results /  Procedures / Treatments   Labs (all labs ordered are listed, but only abnormal results are displayed) Labs Reviewed  COMPREHENSIVE METABOLIC PANEL WITH GFR - Abnormal; Notable for the following components:      Result Value   Sodium 134 (*)    Glucose, Bld 104 (*)    Calcium  8.8 (*)    All other components within normal limits  CBC WITH DIFFERENTIAL/PLATELET - Abnormal; Notable for the following components:   Platelets 136 (*)    All other components within normal limits  SEDIMENTATION RATE - Abnormal; Notable for the following components:   Sed Rate 35 (*)    All other components within normal limits  URINALYSIS, ROUTINE W REFLEX MICROSCOPIC - Abnormal; Notable for the following components:   Color, Urine STRAW (*)    APPearance CLEAR (*)    Specific Gravity, Urine 1.002 (*)    All other components within normal limits  CULTURE, BLOOD (ROUTINE X 2)  CULTURE, BLOOD (ROUTINE X 2)  LACTIC ACID, PLASMA  ROCKY MTN SPOTTED FVR ABS PNL(IGG+IGM)  LYME DISEASE, WESTERN BLOT     EKG     RADIOLOGY Ultrasound right upper extremity    PROCEDURES:   Procedures  Critical Care:  no Chief Complaint  Patient presents with   Fever      MEDICATIONS ORDERED IN ED: Medications  sodium chloride  0.9 % bolus 1,000 mL (0 mLs Intravenous Stopped 03/25/24 1227)  methylPREDNISolone sodium succinate (SOLU-MEDROL) 125 mg/2 mL injection 125 mg (125 mg Intravenous Given 03/25/24 1121)  diphenhydrAMINE  (BENADRYL ) injection 25 mg (25 mg Intravenous Given 03/25/24 1122)  famotidine  (PEPCID ) IVPB 20 mg premix (0 mg Intravenous Stopped 03/25/24 1226)  vancomycin  (VANCOCIN ) IVPB 1000 mg/200 mL premix (0 mg Intravenous Stopped 03/25/24 1427)     IMPRESSION / MDM / ASSESSMENT AND PLAN / ED COURSE  I reviewed the triage vital signs and the nursing notes.                              Differential diagnosis includes, but is not limited to, cellulitis, contact dermatitis, vasculitis, Lyme's, [place] spotted fever  Patient's presentation is most consistent with acute illness / injury with system symptoms.    Medications given: Solu-Medrol 125 mg IV, Benadryl  25 mg IV, Pepcid  20 mg IV, normal saline 1 L  CBC reassuring,, comprehensive metabolic panel reassuring, sed rate slightly elevated at 35,   patient has not started any new medications to warrant allergic reaction.  Unsure if the patient has more of a contact dermatitis, [place] spotted fever or Lyme's versus cellulitis.  Will do a trial of Solu-Medrol, Benadryl , Pepcid .  Patient states she only had a localized reaction to the dexamethasone  and has taken oral steroids without any problems.  If these medications do not help.  Will run the patient on antibiotic for 7 days.   Since patient had absolutely no relief with these medications, asked Dr. Dorothyann to see the patient.  He states to add blood cultures, lactate, UA, and ultrasound for right upper extremity.  Is concerned about fever that she had that was 103-104 the other day. Patient's lactic is normal, UA is normal, do not feel this is a source for infection.  Blood cultures are still pending.  Patient was given vancomycin  1 g IV.  Some of the redness seems to have dissipated at this time.  However the area is still warm to touch.  Will place her on doxycycline  at discharge.  She is to follow-up with her regular doctor as needed.  Return to emergency department if she is worsening as she will possibly need to be admitted at that time.  She is in agreement with treatment plan.  We did consider admission but feel that we can do a trial of outpatient antibiotics prior to admitting patient.  Discharged in stable condition.    FINAL CLINICAL IMPRESSION(S) / ED DIAGNOSES   Final diagnoses:  Cellulitis of right arm     Rx / DC Orders   ED Discharge Orders          Ordered    doxycycline  (VIBRA -TABS) 100 MG tablet  2 times daily        03/25/24 1431              Note:  This document was prepared using Dragon voice recognition software and may include unintentional  dictation errors.    Gasper Devere ORN, PA-C 03/25/24 1651    Dorothyann Drivers, MD 03/25/24 [phone]

## 2024-03-25 NOTE — Discharge Instructions (Signed)
 Go to the emergency department for the redness and swelling of your right arm, as well as the high fever that you experienced at home.

## 2024-03-25 NOTE — Discharge Instructions (Addendum)
 Your blood cell count, kidney functions, test for sepsis, are all normal Your sed rate which looks for inflammation was a little elevated.  You may want to follow-up with your doctor concerning this.  At this time there is nothing dangerous about it. Your Lyme's and [facility] spotted fever test will not be back for at least 2 to 3 days.  Someone will call you if these are positive.  You may see the same results on [facility] MyChart. Take the antibiotic as prescribed.  Return emergency department if worsening

## 2024-03-25 NOTE — ED Triage Notes (Signed)
 Pt to ED from UC for fever, rash. Reports mainly here for rash, is sore. Rash only on right side of body.

## 2024-03-25 NOTE — ED Notes (Signed)
 Patient is being discharged from the Urgent Care and sent to the Emergency Department via personal vehicle . Per NP Corlis, patient is in need of higher level of care due to rash and fevers at home. Patient is aware and verbalizes understanding of plan of care.  Vitals:   03/25/24 0923  BP: 127/78  Pulse: 91  Resp: 18  Temp: 98.6 F (37 C)  SpO2: 96%

## 2024-03-25 NOTE — ED Provider Notes (Signed)
-----------------------------------------   12:14 PM on 03/25/2024 ----------------------------------------- Patient presents to the emergency department for a fever and rash.  According to the patient Wednesday evening she developed a fever, she states as high as 104.  Patient states the fever has largely resolved however last night she noticed a rash on her right upper extremity that has spread overnight so she came to the emergency department today.  States mild tenderness over the rash.  States mild itching over the rash but denies any bites or exposure to allergens, poison ivy, etc.  Patient denies any cough or congestion no abdominal pain nausea vomiting or diarrhea, no sore throat.  On my evaluation patient has an erythematous rash over the right upper extremity it is patchy but seems to follow the lymphatics almost to the axilla.  I suspect this could be developing cellulitis.  Patient's lab work today does show reassuring CBC with a normal white blood cell count overall reassuring chemistry her ESR is very minimally elevated.  We will check blood cultures and a lactic acid as a precaution obtain an ultrasound of the right upper extremity and dose IV vancomycin  for potential cellulitis.  If the patient's workup otherwise shows no significant finding I believe she could be discharged home on antibiotics such as doxycycline , however I discussed with the patient very strict return precautions if the rash appears to be spreading even by this evening or tomorrow morning she needs to return to the emergency department as she may require admission for IV antibiotics however at this time as the patient appears clinically very well with reassuring vitals and reassuring labs I believe a trial of home antibiotics is warranted.   Dorothyann Drivers, MD 03/25/24 1217

## 2024-03-25 NOTE — ED Triage Notes (Signed)
 Pt being seen in UC for fever that began Wednesday. Pt reports fever got as high as 103/104F. Pt reports rash on R arm that began yesterday. Pt reports fatigue. Pt denies sick contact, working outside, cough, SOB, and chest pain. Pt reports taking covid test at home with negative result. Pt denies taking any otc medication.

## 2024-03-25 NOTE — ED Provider Notes (Signed)
 Michele Cruz    CSN: [id number] Arrival date & time: 03/25/24  0913      History   Chief Complaint Chief Complaint  Patient presents with   Rash   Fever    HPI Michele Cruz is a 68 y.o. female.  Patient presents with rash swelling and redness of her right arm since yesterday.  No known cause of the rash.  She reports high fever of 103-104 two days ago which resolved.  No OTC medication taken today.  She denies other symptoms.  The history is provided by the patient and medical records.    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   Family history of adverse reaction to anesthesia    sister PONV   Heart murmur    Heavy menses    Hemorrhoids    Hidradenitis suppurativa    History of exercise stress test 06/1999   neg   History of methicillin resistant staphylococcus aureus (MRSA) 2019   + surgical pcr swab   History of MRI 2003   neg per patient   History of MRI 2007   right quad fatty defect   History of MRI of spine 03/2004   C-5, C5-6 disk protrusion   Hyperlipidemia    LDL   Personal history of radiation therapy    Rosacea    Spondylosis    deg lumbar disc dz   Wisdom teeth removed    Zoster     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   Encounter for screening mammogram for breast cancer 01/24/2022   Estrogen deficiency 01/24/2022   Internal and external bleeding hemorrhoids 08/16/2021   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   Internal hemorrhoids 07/21/2017   Hydradenitis 07/21/2017   Obesity (BMI 30-39.9)  12/09/2016   Encounter for routine gynecological examination 01/10/2014   Routine general medical examination at a health care facility 03/25/2011   HYPERCHOLESTEROLEMIA 12/11/2009   History of colonic polyps 08/03/2009   HERNIATED CERVICAL DISC 06/20/2009   BACK PAIN, LUMBAR, WITH RADICULOPATHY 10/13/2008   Generalized anxiety disorder 05/24/2007   Allergic rhinitis 05/24/2007   ROSACEA 05/24/2007   CARDIAC MURMUR 05/24/2007    Past Surgical History:  Procedure Laterality Date   BREAST BIOPSY Right 03/28/2022   breast center in [place]   BREAST CYST ASPIRATION  03/2002   BREAST CYST ASPIRATION  03/2004   left   BREAST LUMPECTOMY WITH RADIOFREQUENCY TAG IDENTIFICATION Right 05/07/2022   Procedure: BREAST LUMPECTOMY WITH RADIOFREQUENCY TAG IDENTIFICATION;  Surgeon: Lane Shope, MD;  Location: [facility] ORS;  Service: General;  Laterality: Right;   CATARACT EXTRACTION Bilateral    CESAREAN SECTION     COLONOSCOPY  01/26/2012   cyst removed     back of neck, august 2025   EYE SURGERY  7/12&26/2021   Cataracts   HEMORRHOID BANDING  08/2021   JOINT REPLACEMENT  03/15/2018 & 07/28/2018   TKR left & right   KNEE ARTHROPLASTY Left 03/15/2018  Procedure: COMPUTER ASSISTED TOTAL KNEE ARTHROPLASTY;  Surgeon: Mardee Lynwood SQUIBB, MD;  Location: [facility] ORS;  Service: Orthopedics;  Laterality: Left;   KNEE ARTHROPLASTY Right 07/28/2018   Procedure: COMPUTER ASSISTED TOTAL KNEE ARTHROPLASTY;  Surgeon: Mardee Lynwood SQUIBB, MD;  Location: [facility] ORS;  Service: Orthopedics;  Laterality: Right;   KNEE SURGERY Right     OB History   No obstetric history on file.      Home Medications    Prior to Admission medications   Medication Sig Start Date End Date Taking? Authorizing Provider  anastrozole  (ARIMIDEX ) 1 MG tablet TAKE 1 TABLET EVERY DAY 10/19/23   Babara Call, MD  Calcium  Carbonate (CALTRATE 600 PO) Take 1-2 capsules by mouth daily.    [provider]  Cholecalciferol (VITAMIN D3) 25 MCG  (1000 UT) CAPS Take 1 capsule by mouth daily.    [provider]  cyanocobalamin (VITAMIN B12) 1000 MCG tablet Take 1,000 mcg by mouth daily.    [provider]  Flaxseed, Linseed, (FLAXSEED OIL PO) Take 1 tablet by mouth daily.    [provider]  Loratadine  10 MG CAPS Take 1 capsule by mouth daily. 01/18/23   [provider]  Magnesium  500 MG CAPS Take 1 capsule by mouth daily at 6 (six) AM.    [provider]  Multiple Vitamins-Minerals (CENTRUM SILVER 50+WOMEN) TABS Take 1 capsule by mouth daily. 09/22/22   [provider]  Niacinamide (VITAMIN B3 PO) Take 500 mg by mouth daily.    [provider]  NONFORMULARY OR COMPOUNDED ITEM Azelaic Acid /Metronidazole/Ivermectin  cream pump    [provider]  Omega-3 Fatty Acids (FISH OIL) 1200 MG CAPS Take 1 capsule by mouth daily.    [provider]  Povidone (IVIZIA DRY EYES OP) Apply 1-2 drops to eye in the morning and at bedtime.    [provider]  TURMERIC CURCUMIN PO Take 30 mg by mouth daily.    [provider]  vitamin E 200 UNIT capsule Take 200 Units by mouth daily. Patient taking differently: Take 400 Units by mouth daily.    [provider]    Family History Family History  Problem Relation Age of Onset   Arthritis Mother        OA   Obesity Mother    Diverticulitis Mother    Colon polyps Mother    Hypertension Mother    Heart disease Father        CAD   Alcohol  abuse Father    Diabetes Father    Hyperlipidemia Sister    Diverticulitis Sister    Diabetes Other    Diverticulitis Sister    Diabetes Maternal Grandmother    Hyperlipidemia Paternal Grandmother    Asthma Sister    Hyperlipidemia Sister    Colon cancer Neg Hx    Breast cancer Neg Hx    Esophageal cancer Neg Hx    Rectal cancer Neg Hx    Stomach cancer Neg Hx     Social History Social History   Tobacco Use   Smoking status: Never    Passive exposure:  Never   Smokeless tobacco: Never  Vaping Use   Vaping status: Never Used  Substance Use Topics   Alcohol  use: Not Currently    Alcohol /week: 4.0 standard drinks of alcohol     Comment: 1-2 drinks/month   Drug use: No     Allergies   Decadron  [dexamethasone ], Crestor  [rosuvastatin ], Depo-medrol [methylprednisolone acetate], Lipitor [atorvastatin  calcium ], Meloxicam, and Simvastatin  Review of Systems Review of Systems  Constitutional:  Positive for fever. Negative for chills.  Respiratory:  Negative for cough and shortness of breath.   Musculoskeletal:  Positive for arthralgias and joint swelling.  Skin:  Positive for color change and rash.  Neurological:  Negative for weakness and numbness.     Physical Exam Triage Vital Signs ED Triage Vitals  Encounter Vitals Group     BP      Girls Systolic BP Percentile      Girls Diastolic BP Percentile      Boys Systolic BP Percentile      Boys Diastolic BP Percentile      Pulse      Resp      Temp      Temp src      SpO2      Weight      Height      Head Circumference      Peak Flow      Pain Score      Pain Loc      Pain Education      Exclude from Growth Chart    No data found.  Updated Vital Signs BP 127/78   Pulse 91   Temp 98.6 F (37 C)   Resp 18   SpO2 96%   Visual Acuity Right Eye Distance:   Left Eye Distance:   Bilateral Distance:    Right Eye Near:   Left Eye Near:    Bilateral Near:     Physical Exam Constitutional:      General: She is not in acute distress. HENT:     Mouth/Throat:     Mouth: Mucous membranes are moist.  Cardiovascular:     Rate and Rhythm: Normal rate and regular rhythm.  Pulmonary:     Effort: Pulmonary effort is normal. No respiratory distress.  Musculoskeletal:        General: Swelling present. No deformity. Normal range of motion.  Skin:    General: Skin is warm and dry.     Capillary Refill: Capillary refill takes less than 2 seconds.     Findings: Erythema  and rash present.     Comments: Right upper extremity: Blanchable erythema and swelling from hand to just below shoulder.  Neurological:     General: No focal deficit present.     Mental Status: She is alert.     Sensory: No sensory deficit.     Motor: No weakness.      UC Treatments / Results  Labs (all labs ordered are listed, but only abnormal results are displayed) Labs Reviewed - No data to display  EKG   Radiology No results found.  Procedures Procedures (including critical care time)  Medications Ordered in UC Medications - No data to display  Initial Impression / Assessment and Plan / UC Course  I have reviewed the triage vital signs and the nursing notes.  Pertinent labs & imaging results that were available during my care of the patient were reviewed by me and considered in my medical decision making (see chart for details).    Cellulitis of right upper extremity, fever.  Afebrile and vital signs are stable.  Patient's right arm has marked edema and erythema from hand to just below shoulder.  Patient reports a high fever at home 2 days ago which resolved.  Sending her to the ED for evaluation.  She is agreeable to this and will drive herself to [facility] ED.  Final  Clinical Impressions(s) / UC Diagnoses   Final diagnoses:  Cellulitis of right upper extremity  Fever, unspecified     Discharge Instructions      Go to the emergency department for the redness and swelling of your right arm, as well as the high fever that you experienced at home.     ED Prescriptions   None    PDMP not reviewed this encounter.   Corlis Burnard DEL, NP 03/25/24 [id number]

## 2024-03-26 LAB — LYME DISEASE, WESTERN BLOT
IgG P18 Ab.: ABSENT
IgG P23 Ab.: ABSENT
IgG P28 Ab.: ABSENT
IgG P30 Ab.: ABSENT
IgG P39 Ab.: ABSENT
IgG P45 Ab.: ABSENT
IgG P58 Ab.: ABSENT
IgG P66 Ab.: ABSENT
IgG P93 Ab.: ABSENT
IgM P23 Ab.: ABSENT
IgM P39 Ab.: ABSENT
IgM P41 Ab.: ABSENT
Lyme IgG Wb: NEGATIVE
Lyme IgM Wb: NEGATIVE

## 2024-03-28 ENCOUNTER — Ambulatory Visit (INDEPENDENT_AMBULATORY_CARE_PROVIDER_SITE_OTHER): Admitting: Family Medicine

## 2024-03-28 ENCOUNTER — Encounter: Payer: Self-pay | Admitting: Family Medicine

## 2024-03-28 VITALS — BP 114/70 | HR 73 | Temp 98.3°F | Ht 66.5 in | Wt 212.4 lb

## 2024-03-28 DIAGNOSIS — L03113 Cellulitis of right upper limb: Secondary | ICD-10-CM

## 2024-03-28 DIAGNOSIS — L0390 Cellulitis, unspecified: Secondary | ICD-10-CM | POA: Insufficient documentation

## 2024-03-28 MED ORDER — PREDNISONE 10 MG PO TABS: ORAL_TABLET | ORAL | 0 refills | Status: DC

## 2024-03-28 NOTE — Progress Notes (Signed)
 Subjective:    Patient ID: Michele Cruz, female    DOB: Sep 25, 1955, 68 y.o.   MRN: [id number]  HPI  Wt Readings from Last 3 Encounters:  03/28/24 212 lb 6 oz (96.3 kg)  03/25/24 212 lb (96.2 kg)  02/18/24 211 lb (95.7 kg)   33.76 kg/m  Vitals:   03/28/24 0816  BP: 114/70  Pulse: 73  Temp: 98.3 F (36.8 C)  SpO2: 98%     Pt presents for follow up of cellulitis (treated on ER on 10/3)  Presented with fever (t max 104) and redness of RUE (? Rash)   Treated with steroids, diphenhydratmine, famotidine   and vancomycin   Discharged with doxycycline   Lab Results  Component Value Date   NA 134 (L) 03/25/2024   K 3.8 03/25/2024   CO2 23 03/25/2024   GLUCOSE 104 (H) 03/25/2024   BUN 14 03/25/2024   CREATININE 0.76 03/25/2024   CALCIUM  8.8 (L) 03/25/2024   GFR 67.03 01/27/2024   GFRNONAA >60 03/25/2024   Lab Results  Component Value Date   ALT 18 03/25/2024   AST 27 03/25/2024   ALKPHOS 56 03/25/2024   BILITOT 1.1 03/25/2024    Lab Results  Component Value Date   WBC 6.2 03/25/2024   HGB 14.2 03/25/2024   HCT 43.0 03/25/2024   MCV 84.5 03/25/2024   PLT 136 (L) 03/25/2024   Lab Results  Component Value Date   ESRSEDRATE 35 (H) 03/25/2024   Lyme titers neg Blood cultures neg  Lactic acid nl  Doppler showed no DVT in RUE  Now The arm itches a lot  Improved in lower arm and more red in upper arm  No longer painful  Warm to the touch   No other rash No hives   No other fever   No known insect bite but cannot rule it out        Patient Active Problem List   Diagnosis Date Noted   Cellulitis 03/28/2024   Elevated glucose level 01/18/2023   Aromatase inhibitor use 08/14/2022   Goals of care, counseling/discussion 04/02/2022   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   Encounter for screening mammogram for breast cancer 01/24/2022   Estrogen deficiency 01/24/2022   Internal and external bleeding hemorrhoids 08/16/2021   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   Internal hemorrhoids 07/21/2017   Hydradenitis 07/21/2017   Obesity (BMI 30-39.9) 12/09/2016   Encounter for routine gynecological examination 01/10/2014   Routine general medical examination at a health care facility 03/25/2011   HYPERCHOLESTEROLEMIA 12/11/2009   History of colonic polyps 08/03/2009   HERNIATED CERVICAL DISC 06/20/2009   BACK PAIN, LUMBAR, WITH RADICULOPATHY 10/13/2008   Generalized anxiety disorder 05/24/2007   Allergic rhinitis 05/24/2007   ROSACEA 05/24/2007   CARDIAC MURMUR 05/24/2007   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   Family history of adverse reaction to anesthesia    sister PONV   Heart murmur    Heavy menses    Hemorrhoids    Hidradenitis suppurativa    History of exercise stress test 06/1999   neg   History of methicillin resistant staphylococcus aureus (MRSA)  2019   + surgical pcr swab   History of MRI 2003   neg per patient   History of MRI 2007   right quad fatty defect   History of MRI of spine 03/2004   C-5, C5-6 disk protrusion   Hyperlipidemia    LDL   Personal history of radiation therapy    Rosacea    Spondylosis    deg lumbar disc dz   Wisdom teeth removed    Zoster    Past Surgical History:  Procedure Laterality Date   BREAST BIOPSY Right 03/28/2022   breast center in [place]   BREAST CYST ASPIRATION  03/2002   BREAST CYST ASPIRATION  03/2004   left   BREAST LUMPECTOMY WITH RADIOFREQUENCY TAG IDENTIFICATION Right 05/07/2022   Procedure: BREAST LUMPECTOMY WITH RADIOFREQUENCY TAG IDENTIFICATION;  Surgeon: Lane Shope, MD;  Location: [facility];  Service: General;  Laterality: Right;   CATARACT EXTRACTION Bilateral    CESAREAN  SECTION     COLONOSCOPY  01/26/2012   cyst removed     back of neck, august 2025   EYE SURGERY  7/12&26/2021   Cataracts   HEMORRHOID BANDING  08/2021   JOINT REPLACEMENT  03/15/2018 & 07/28/2018   TKR left & right   KNEE ARTHROPLASTY Left 03/15/2018   Procedure: COMPUTER ASSISTED TOTAL KNEE ARTHROPLASTY;  Surgeon: Mardee Lynwood SQUIBB, MD;  Location: [facility];  Service: Orthopedics;  Laterality: Left;   KNEE ARTHROPLASTY Right 07/28/2018   Procedure: COMPUTER ASSISTED TOTAL KNEE ARTHROPLASTY;  Surgeon: Mardee Lynwood SQUIBB, MD;  Location: [facility];  Service: Orthopedics;  Laterality: Right;   KNEE SURGERY Right    Social History   Tobacco Use   Smoking status: Never    Passive exposure: Never   Smokeless tobacco: Never  Vaping Use   Vaping status: Never Used  Substance Use Topics   Alcohol  use: Not Currently    Alcohol /week: 4.0 standard drinks of alcohol     Comment: 1-2 drinks/month   Drug use: No   Family History  Problem Relation Age of Onset   Arthritis Mother        OA   Obesity Mother    Diverticulitis Mother    Colon polyps Mother    Hypertension Mother    Heart disease Father        CAD   Alcohol  abuse Father    Diabetes Father    Hyperlipidemia Sister    Diverticulitis Sister    Diabetes Other    Diverticulitis Sister    Diabetes Maternal Grandmother    Hyperlipidemia Paternal Grandmother    Asthma Sister    Hyperlipidemia Sister    Colon cancer Neg Hx    Breast cancer Neg Hx    Esophageal cancer Neg Hx    Rectal cancer Neg Hx    Stomach cancer Neg Hx    Allergies  Allergen Reactions   Decadron  [Dexamethasone ] Other (See Comments)    Unsure if related---swelling of throat   Crestor  [Rosuvastatin ]     Muscle pain    Depo-Medrol [Methylprednisolone Acetate]     Rash after injection, a noted common SE from depo-medrol listed in epocrates and most resources   Lipitor [Atorvastatin  Calcium ] Other (See Comments)    Leg cramps and pain   Meloxicam     Pt felt  dizzy potentially from this, but does well on aleve   Simvastatin     Possible transient memory changes, resolved off medicine. Muscle pain   Current Outpatient Medications on  File Prior to Visit  Medication Sig Dispense Refill   anastrozole  (ARIMIDEX ) 1 MG tablet TAKE 1 TABLET EVERY DAY 90 tablet 3   Calcium  Carbonate (CALTRATE 600 PO) Take 1-2 capsules by mouth daily.     Cholecalciferol (VITAMIN D3) 25 MCG (1000 UT) CAPS Take 1 capsule by mouth daily.     cyanocobalamin (VITAMIN B12) 1000 MCG tablet Take 1,000 mcg by mouth daily.     doxycycline  (VIBRA -TABS) 100 MG tablet Take 1 tablet (100 mg total) by mouth 2 (two) times daily. 20 tablet 0   Flaxseed, Linseed, (FLAXSEED OIL PO) Take 1 tablet by mouth daily.     Loratadine  10 MG CAPS Take 1 capsule by mouth daily.     Magnesium  500 MG CAPS Take 1 capsule by mouth daily at 6 (six) AM.     Multiple Vitamins-Minerals (CENTRUM SILVER 50+WOMEN) TABS Take 1 capsule by mouth daily.     Niacinamide (VITAMIN B3 PO) Take 500 mg by mouth daily.     NONFORMULARY OR COMPOUNDED ITEM Azelaic Acid /Metronidazole/Ivermectin  cream pump     Omega-3 Fatty Acids (FISH OIL) 1200 MG CAPS Take 1 capsule by mouth daily.     Povidone (IVIZIA DRY EYES OP) Apply 1-2 drops to eye in the morning and at bedtime.     TURMERIC CURCUMIN PO Take 30 mg by mouth daily.     vitamin E 200 UNIT capsule Take 200 Units by mouth daily. (Patient taking differently: Take 400 Units by mouth daily.)     No current facility-administered medications on file prior to visit.    Review of Systems  Constitutional:  Negative for activity change, appetite change, fatigue, fever and unexpected weight change.  HENT:  Negative for congestion, ear pain, rhinorrhea, sinus pressure and sore throat.   Eyes:  Negative for pain, redness and visual disturbance.  Respiratory:  Negative for cough, shortness of breath and wheezing.   Cardiovascular:  Negative for chest pain and palpitations.   Gastrointestinal:  Negative for abdominal pain, blood in stool, constipation and diarrhea.  Endocrine: Negative for polydipsia and polyuria.  Genitourinary:  Negative for dysuria, frequency and urgency.  Musculoskeletal:  Negative for arthralgias, back pain and myalgias.  Skin:  Negative for pallor and rash.       Right arm red and itchy and swollen  Allergic/Immunologic: Negative for environmental allergies.  Neurological:  Negative for dizziness, syncope and headaches.  Hematological:  Negative for adenopathy. Does not bruise/bleed easily.  Psychiatric/Behavioral:  Negative for decreased concentration and dysphoric mood. The patient is not nervous/anxious.        Objective:   Physical Exam Constitutional:      General: She is not in acute distress.    Appearance: Normal appearance. She is well-developed. She is obese. She is not ill-appearing or diaphoretic.  HENT:     Head: Normocephalic and atraumatic.  Eyes:     Conjunctiva/sclera: Conjunctivae normal.     Pupils: Pupils are equal, round, and reactive to light.  Neck:     Thyroid : No thyromegaly.     Vascular: No carotid bruit or JVD.  Cardiovascular:     Rate and Rhythm: Normal rate and regular rhythm.     Heart sounds: Normal heart sounds.     No gallop.  Pulmonary:     Effort: Pulmonary effort is normal. No respiratory distress.     Breath sounds: Normal breath sounds. No wheezing or rales.  Abdominal:     General: There is no distension or  abdominal bruit.     Palpations: Abdomen is soft.  Musculoskeletal:     Cervical back: Normal range of motion and neck supple.     Right lower leg: No edema.     Left lower leg: No edema.  Lymphadenopathy:     Cervical: No cervical adenopathy.  Skin:    General: Skin is warm and dry.     Coloration: Skin is not pale.     Findings: No rash.     Comments: Mild erythema/patchy on lower RUE More confluent on upper RUE  No open areas Some whelps noted  No excoriation   Non  tender  Warm to the touch  Neurological:     Mental Status: She is alert.     Cranial Nerves: No cranial nerve deficit.     Motor: No weakness.     Coordination: Coordination normal.     Deep Tendon Reflexes: Reflexes are normal and symmetric. Reflexes normal.  Psychiatric:        Mood and Affect: Mood normal.           Assessment & Plan:   Problem List Items Addressed This Visit       Other   Cellulitis - Primary   Seen in ER for erythema and swelling of right upper extremity  Reviewed hospital records, lab results and studies in detail  Was treated for both infx and allergic reaction and doppler noted no clot   Today lower arm is improved/upper arm more red  Exam consistent with some degree of allergic reaction-some whelps (along with confluent redness) Primary symptom is itching (not pain)   Plan to continue doxycycline  for full course Prescription prednisone 40 mg taper (aware of possible side effects) to take for itching and swelling Close obs -follow up later this week   Call back and Er precautions noted in detail today

## 2024-03-28 NOTE — Assessment & Plan Note (Signed)
 Seen in ER for erythema and swelling of right upper extremity  Reviewed hospital records, lab results and studies in detail  Was treated for both infx and allergic reaction and doppler noted no clot   Today lower arm is improved/upper arm more red  Exam consistent with some degree of allergic reaction-some whelps (along with confluent redness) Primary symptom is itching (not pain)   Plan to continue doxycycline  for full course Prescription prednisone 40 mg taper (aware of possible side effects) to take for itching and swelling Close obs -follow up later this week   Call back and Er precautions noted in detail today

## 2024-03-28 NOTE — Patient Instructions (Addendum)
 Take prednisone as directed -this is for possible allergic reaction and swelling   Use some cool compresses   Avoid chemicals/scents/ hot water    Elevate arm when you can   Continue the doxycycline    Call if you have increased redness/ swelling /pain or fever  Follow up this week for a re check  If you get any swelling of mouth or throat or trouble breathing-go to ER/ call 911

## 2024-03-30 LAB — CULTURE, BLOOD (ROUTINE X 2)
Culture: NO GROWTH
Culture: NO GROWTH

## 2024-04-01 ENCOUNTER — Encounter: Payer: Self-pay | Admitting: Family Medicine

## 2024-04-01 ENCOUNTER — Ambulatory Visit: Admitting: Family Medicine

## 2024-04-01 VITALS — BP 118/76 | HR 62 | Temp 98.2°F | Ht 66.5 in | Wt 216.0 lb

## 2024-04-01 DIAGNOSIS — L03113 Cellulitis of right upper limb: Secondary | ICD-10-CM

## 2024-04-01 NOTE — Patient Instructions (Signed)
 I'm glad the arm is looking better Finish the prednisone course Also the doxycycline    If redness/ swelling worsen let me know  If itching worsens let me know  If any pain or fever please call    Keep the arm cool / avoid hot water  for now - heat makes itching worse

## 2024-04-01 NOTE — Assessment & Plan Note (Signed)
 Cellulitis and allergic symptoms of RUE are much improved Much less erythema and warmth (no tenderness)  Itching and swelling markedly improved   Instructed to  Finish the doxycycline  and the prednisone  Watch for increased redness/swelling/ pain itching or fever  Update if this does not continue to improve Cool compresses prn   Call back and Er precautions noted in detail today

## 2024-04-01 NOTE — Progress Notes (Signed)
 Subjective:    Patient ID: Michele Cruz, female    DOB: January 06, 1956, 68 y.o.   MRN: [id number]  HPI  Wt Readings from Last 3 Encounters:  04/01/24 216 lb (98 kg)  03/28/24 212 lb 6 oz (96.3 kg)  03/25/24 212 lb (96.2 kg)   34.34 kg/m  Vitals:   04/01/24 0902  BP: 118/76  Pulse: 62  Temp: 98.2 F (36.8 C)  SpO2: 99%     Presents for re check of arm (cellulitis/ redness)   Last visit discussed her ER visit for this and brief fever  Was prescribed doxycycline   At our visit-some whelps and itching noted locally Given prednisone taper 40 mg to help with that   Arm is much better  Still a little pink  A little itchy - but much better   Is hungry     Patient Active Problem List   Diagnosis Date Noted   Cellulitis 03/28/2024   Elevated glucose level 01/18/2023   Aromatase inhibitor use 08/14/2022   Goals of care, counseling/discussion 04/02/2022   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   Encounter for screening mammogram for breast cancer 01/24/2022   Estrogen deficiency 01/24/2022   Internal and external bleeding hemorrhoids 08/16/2021   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   Internal hemorrhoids 07/21/2017   Hydradenitis 07/21/2017   Obesity (BMI 30-39.9) 12/09/2016   Encounter for routine gynecological examination 01/10/2014   Routine general medical examination at a health care facility 03/25/2011   HYPERCHOLESTEROLEMIA 12/11/2009   History of colonic polyps 08/03/2009   HERNIATED CERVICAL DISC 06/20/2009   BACK PAIN, LUMBAR, WITH RADICULOPATHY 10/13/2008   Generalized anxiety disorder 05/24/2007   Allergic rhinitis 05/24/2007   ROSACEA 05/24/2007   CARDIAC MURMUR 05/24/2007   Past Medical History:  Diagnosis Date   Allergic rhinitis    Allergy 10/1980   Anemia, iron deficiency    Anxiety    Arthritis    Asthma    no symptoms last 25 years   Cataract 01/02/20, 01/16/20   Colon polyps    COVID-19 07/2020   DCIS (ductal carcinoma  in situ)    right breast   Family history of adverse reaction to anesthesia    sister PONV   Heart murmur    Heavy menses    Hemorrhoids    Hidradenitis suppurativa    History of exercise stress test 06/1999   neg   History of methicillin resistant staphylococcus aureus (MRSA) 2019   + surgical pcr swab   History of MRI 2003   neg per patient   History of MRI 2007   right quad fatty defect   History of MRI of spine 03/2004   C-5, C5-6 disk protrusion   Hyperlipidemia    LDL   Personal history of radiation therapy    Rosacea    Spondylosis    deg lumbar disc dz   Wisdom teeth removed    Zoster    Past Surgical History:  Procedure Laterality Date   BREAST BIOPSY Right 03/28/2022   breast center in [place]   BREAST CYST ASPIRATION  03/2002   BREAST CYST ASPIRATION  03/2004   left   BREAST LUMPECTOMY WITH RADIOFREQUENCY TAG IDENTIFICATION Right 05/07/2022   Procedure: BREAST LUMPECTOMY WITH RADIOFREQUENCY TAG IDENTIFICATION;  Surgeon: Lane Shope, MD;  Location: [facility];  Service: General;  Laterality: Right;   CATARACT EXTRACTION Bilateral    CESAREAN SECTION     COLONOSCOPY  01/26/2012   cyst removed  back of neck, august 2025   EYE SURGERY  7/12&26/2021   Cataracts   HEMORRHOID BANDING  08/2021   JOINT REPLACEMENT  03/15/2018 & 07/28/2018   TKR left & right   KNEE ARTHROPLASTY Left 03/15/2018   Procedure: COMPUTER ASSISTED TOTAL KNEE ARTHROPLASTY;  Surgeon: Mardee Lynwood SQUIBB, MD;  Location: [facility];  Service: Orthopedics;  Laterality: Left;   KNEE ARTHROPLASTY Right 07/28/2018   Procedure: COMPUTER ASSISTED TOTAL KNEE ARTHROPLASTY;  Surgeon: Mardee Lynwood SQUIBB, MD;  Location: [facility];  Service: Orthopedics;  Laterality: Right;   KNEE SURGERY Right    Social History   Tobacco Use   Smoking status: Never    Passive exposure: Never   Smokeless tobacco: Never  Vaping Use   Vaping status: Never Used  Substance Use Topics   Alcohol  use: Not Currently     Alcohol /week: 4.0 standard drinks of alcohol     Comment: 1-2 drinks/month   Drug use: No   Family History  Problem Relation Age of Onset   Arthritis Mother        OA   Obesity Mother    Diverticulitis Mother    Colon polyps Mother    Hypertension Mother    Heart disease Father        CAD   Alcohol  abuse Father    Diabetes Father    Hyperlipidemia Sister    Diverticulitis Sister    Diabetes Other    Diverticulitis Sister    Diabetes Maternal Grandmother    Hyperlipidemia Paternal Grandmother    Asthma Sister    Hyperlipidemia Sister    Colon cancer Neg Hx    Breast cancer Neg Hx    Esophageal cancer Neg Hx    Rectal cancer Neg Hx    Stomach cancer Neg Hx    Allergies  Allergen Reactions   Decadron  [Dexamethasone ] Other (See Comments)    Unsure if related---swelling of throat   Crestor  [Rosuvastatin ]     Muscle pain    Depo-Medrol [Methylprednisolone Acetate]     Rash after injection, a noted common SE from depo-medrol listed in epocrates and most resources   Lipitor [Atorvastatin  Calcium ] Other (See Comments)    Leg cramps and pain   Meloxicam     Pt felt dizzy potentially from this, but does well on aleve   Simvastatin     Possible transient memory changes, resolved off medicine. Muscle pain   Current Outpatient Medications on File Prior to Visit  Medication Sig Dispense Refill   anastrozole  (ARIMIDEX ) 1 MG tablet TAKE 1 TABLET EVERY DAY 90 tablet 3   Calcium  Carbonate (CALTRATE 600 PO) Take 1-2 capsules by mouth daily.     Cholecalciferol (VITAMIN D3) 25 MCG (1000 UT) CAPS Take 1 capsule by mouth daily.     cyanocobalamin (VITAMIN B12) 1000 MCG tablet Take 1,000 mcg by mouth daily.     doxycycline  (VIBRA -TABS) 100 MG tablet Take 1 tablet (100 mg total) by mouth 2 (two) times daily. 20 tablet 0   Flaxseed, Linseed, (FLAXSEED OIL PO) Take 1 tablet by mouth daily.     Loratadine  10 MG CAPS Take 1 capsule by mouth daily.     Magnesium  500 MG CAPS Take 1 capsule by  mouth daily at 6 (six) AM.     Multiple Vitamins-Minerals (CENTRUM SILVER 50+WOMEN) TABS Take 1 capsule by mouth daily.     Niacinamide (VITAMIN B3 PO) Take 500 mg by mouth daily.     NONFORMULARY OR COMPOUNDED ITEM Azelaic Acid /Metronidazole/Ivermectin   cream pump     Omega-3 Fatty Acids (FISH OIL) 1200 MG CAPS Take 1 capsule by mouth daily.     Povidone (IVIZIA DRY EYES OP) Apply 1-2 drops to eye in the morning and at bedtime.     predniSONE (DELTASONE) 10 MG tablet Take 4 pills once daily by mouth for 3 days, then 3 pills daily for 3 days, then 2 pills daily for 3 days then 1 pill daily for 3 days then stop 30 tablet 0   TURMERIC CURCUMIN PO Take 30 mg by mouth daily.     vitamin E 200 UNIT capsule Take 200 Units by mouth daily. (Patient taking differently: Take 400 Units by mouth daily.)     No current facility-administered medications on file prior to visit.    Review of Systems  Constitutional:  Negative for fatigue and fever.  Gastrointestinal:  Negative for diarrhea.  Endocrine:       Increased hunger from prednisone   Skin:  Negative for wound.       Some hyperpigmentation of right arm and mild itching but much better than it was        Objective:   Physical Exam Constitutional:      General: She is not in acute distress.    Appearance: Normal appearance. She is obese. She is not ill-appearing or diaphoretic.  Cardiovascular:     Rate and Rhythm: Normal rate and regular rhythm.  Pulmonary:     Effort: Pulmonary effort is normal. No respiratory distress.  Musculoskeletal:        General: No tenderness.     Cervical back: Neck supple.  Lymphadenopathy:     Cervical: No cervical adenopathy.  Skin:    Comments: Left upper arm- some faded patchy erythema (slightly warm) and swelling  Much improved from last visit  Forearm- clear No open areas or excoriations Non tender    Neurological:     Mental Status: She is alert.     Sensory: No sensory deficit.  Psychiatric:         Mood and Affect: Mood normal.           Assessment & Plan:   Problem List Items Addressed This Visit       Other   Cellulitis - Primary   Cellulitis and allergic symptoms of RUE are much improved Much less erythema and warmth (no tenderness)  Itching and swelling markedly improved   Instructed to  Finish the doxycycline  and the prednisone  Watch for increased redness/swelling/ pain itching or fever  Update if this does not continue to improve Cool compresses prn   Call back and Er precautions noted in detail today

## 2024-04-08 ENCOUNTER — Ambulatory Visit

## 2024-04-08 VITALS — Ht 66.2 in | Wt 212.0 lb

## 2024-04-08 DIAGNOSIS — Z0000 Encounter for general adult medical examination without abnormal findings: Secondary | ICD-10-CM

## 2024-04-08 NOTE — Progress Notes (Signed)
 Subjective:   Michele Cruz is a 68 y.o. who presents for a Medicare Wellness preventive visit.  As a reminder, Annual Wellness Visits don't include a physical exam, and some assessments may be limited, especially if this visit is performed virtually. We may recommend an in-person follow-up visit with your provider if needed.  Visit Complete: Virtual I connected with  Michele Cruz on 04/08/24 by a audio enabled telemedicine application and verified that I am speaking with the correct person using two identifiers.  Patient Location: Home  Provider Location: Office/Clinic  I discussed the limitations of evaluation and management by telemedicine. The patient expressed understanding and agreed to proceed.  Vital Signs: Because this visit was a virtual/telehealth visit, some criteria may be missing or patient reported. Any vitals not documented were not able to be obtained and vitals that have been documented are patient reported.  VideoDeclined- This patient declined Librarian, academic. Therefore the visit was completed with audio only.  Persons Participating in Visit: Patient.  AWV Questionnaire: Yes: Patient Medicare AWV questionnaire was completed by the patient on 04/07/24; I have confirmed that all information answered by patient is correct and no changes since this date.  Cardiac Risk Factors include: advanced age (>106men, >57 women);obesity (BMI >30kg/m2)     Objective:    Today's Vitals   04/07/24 1152 04/08/24 0811  Weight:  212 lb (96.2 kg)  Height:  5' 6.2 (1.681 m)  PainSc: 0-No pain    Body mass index is 34.01 kg/m.     04/08/2024    8:21 AM 03/25/2024    9:53 AM 09/14/2023    2:46 PM 05/15/2023   11:16 AM 03/12/2023    2:32 PM 01/29/2023    8:44 AM 08/28/2022    2:38 PM  Advanced Directives  Does Patient Have a Medical Advance Directive? Yes Yes No Yes Yes Yes Yes  Type of Aeronautical engineer of [place];Living  will Healthcare Power of [place];Living will Healthcare Power of [place];Living will Healthcare Power of [place];Living will  Does patient want to make changes to medical advance directive?   No - Patient declined  No - Patient declined  Yes (ED - Information included in AVS)  Copy of Healthcare Power of Attorney in Chart?    No - copy requested No - copy requested No - copy requested No - copy requested  Would patient like information on creating a medical advance directive?   No - Patient declined        Current Medications (verified) Outpatient Encounter Medications as of 04/08/2024  Medication Sig   anastrozole  (ARIMIDEX ) 1 MG tablet TAKE 1 TABLET EVERY DAY   Calcium  Carbonate (CALTRATE 600 PO) Take 1-2 capsules by mouth daily.   Cholecalciferol (VITAMIN D3) 25 MCG (1000 UT) CAPS Take 1 capsule by mouth daily.   cyanocobalamin (VITAMIN B12) 1000 MCG tablet Take 1,000 mcg by mouth daily.   Flaxseed, Linseed, (FLAXSEED OIL PO) Take 1 tablet by mouth daily.   Loratadine  10 MG CAPS Take 1 capsule by mouth daily.   Magnesium  500 MG CAPS Take 1 capsule by mouth daily at 6 (six) AM.   Multiple Vitamins-Minerals (CENTRUM SILVER 50+WOMEN) TABS Take 1 capsule by mouth daily.   Niacinamide (VITAMIN B3 PO) Take 500 mg by mouth daily.   NONFORMULARY OR COMPOUNDED ITEM Azelaic Acid /Metronidazole/Ivermectin  cream pump   Omega-3 Fatty Acids (FISH OIL) 1200 MG CAPS Take 1 capsule by mouth daily.   Povidone (IVIZIA DRY  EYES OP) Apply 1-2 drops to eye in the morning and at bedtime.   predniSONE (DELTASONE) 10 MG tablet Take 4 pills once daily by mouth for 3 days, then 3 pills daily for 3 days, then 2 pills daily for 3 days then 1 pill daily for 3 days then stop   TURMERIC CURCUMIN PO Take 30 mg by mouth daily.   vitamin E 200 UNIT capsule Take 200 Units by mouth daily. (Patient taking differently: Take 400 Units by mouth daily.)   doxycycline  (VIBRA -TABS) 100 MG tablet Take 1 tablet (100 mg total) by  mouth 2 (two) times daily. (Patient not taking: Reported on 04/08/2024)   No facility-administered encounter medications on file as of 04/08/2024.    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   Family history of adverse reaction to anesthesia    sister PONV   Heart murmur    Heavy menses    Hemorrhoids    Hidradenitis suppurativa    History of exercise stress test 06/1999   neg   History of methicillin resistant staphylococcus aureus (MRSA) 2019   + surgical pcr swab   History of MRI 2003   neg per patient   History of MRI 2007   right quad fatty defect   History of MRI of spine 03/2004   C-5, C5-6 disk protrusion   Hyperlipidemia    LDL   Personal history of radiation therapy    Rosacea    Spondylosis    deg lumbar disc dz   Wisdom teeth removed    Zoster    Past Surgical History:  Procedure Laterality Date   BREAST BIOPSY Right 03/28/2022   breast center in [place]   BREAST CYST ASPIRATION  03/2002   BREAST CYST ASPIRATION  03/2004   left   BREAST LUMPECTOMY WITH RADIOFREQUENCY TAG IDENTIFICATION Right 05/07/2022   Procedure: BREAST LUMPECTOMY WITH RADIOFREQUENCY TAG IDENTIFICATION;  Surgeon: Lane Shope, MD;  Location: [facility];  Service: General;  Laterality: Right;   CATARACT EXTRACTION Bilateral    CESAREAN SECTION     COLONOSCOPY  01/26/2012   cyst removed     back of neck, august 2025   EYE SURGERY  7/12&26/2021   Cataracts   HEMORRHOID BANDING  08/2021   JOINT REPLACEMENT  03/15/2018 & 07/28/2018   TKR left & right   KNEE  ARTHROPLASTY Left 03/15/2018   Procedure: COMPUTER ASSISTED TOTAL KNEE ARTHROPLASTY;  Surgeon: Mardee Lynwood SQUIBB, MD;  Location: [facility];  Service: Orthopedics;  Laterality: Left;   KNEE ARTHROPLASTY Right 07/28/2018   Procedure: COMPUTER ASSISTED TOTAL KNEE ARTHROPLASTY;  Surgeon: Mardee Lynwood SQUIBB, MD;  Location: [facility];  Service: Orthopedics;  Laterality: Right;   KNEE SURGERY Right    Family History  Problem Relation Age of Onset   Arthritis Mother        OA   Obesity Mother    Diverticulitis Mother    Colon polyps Mother    Hypertension Mother    Heart disease Father        CAD  Alcohol  abuse Father    Diabetes Father    Hyperlipidemia Sister    Diverticulitis Sister    Diabetes Other    Diverticulitis Sister    Diabetes Maternal Grandmother    Hyperlipidemia Paternal Grandmother    Asthma Sister    Hyperlipidemia Sister    Colon cancer Neg Hx    Breast cancer Neg Hx    Esophageal cancer Neg Hx    Rectal cancer Neg Hx    Stomach cancer Neg Hx    Social History   Socioeconomic History   Marital status: Married    Spouse name: Not on file   Number of children: 1   Years of education: Not on file   Highest education level: Bachelor's degree (e.g., BA, AB, BS)  Occupational History   Occupation: INSTRUCTIONAL ASST    Comment: Art gallery manager  Tobacco Use   Smoking status: Never    Passive exposure: Never   Smokeless tobacco: Never  Vaping Use   Vaping status: Never Used  Substance and Sexual Activity   Alcohol  use: Not Currently    Alcohol /week: 4.0 standard drinks of alcohol     Comment: 1-2 drinks/month   Drug use: No   Sexual activity: Not Currently    Birth control/protection: Post-menopausal  Other Topics Concern   Not on file  Social History Narrative   One daughter   Daily caffeine; use 1-2 daily   Social Drivers of Health   Financial Resource Strain: Low Risk  (04/07/2024)   Overall Financial Resource Strain (CARDIA)    Difficulty of Paying  Living Expenses: Not hard at all  Food Insecurity: No Food Insecurity (04/07/2024)   Hunger Vital Sign    Worried About Running Out of Food in the Last Year: Never true    Ran Out of Food in the Last Year: Never true  Transportation Needs: No Transportation Needs (04/07/2024)   PRAPARE - Administrator, Civil Service (Medical): No    Lack of Transportation (Non-Medical): No  Physical Activity: Sufficiently Active (04/07/2024)   Exercise Vital Sign    Days of Exercise per Week: 3 days    Minutes of Exercise per Session: 90 min  Stress: No Stress Concern Present (04/07/2024)   [employer] of Occupational Health - Occupational Stress Questionnaire    Feeling of Stress: Only a little  Social Connections: Moderately Isolated (04/07/2024)   Social Connection and Isolation Panel    Frequency of Communication with Friends and Family: Three times a week    Frequency of Social Gatherings with Friends and Family: Once a week    Attends Religious Services: Patient declined    Database administrator or Organizations: No    Attends Engineer, structural: Not on file    Marital Status: Married    Tobacco Counseling Counseling given: Not Answered    Clinical Intake:  Pre-visit preparation completed: Yes  Pain : No/denies pain Pain Score: 0-No pain     BMI - recorded: 34.01 Nutritional Status: BMI > 30  Obese Nutritional Risks: None Diabetes: No  Lab Results  Component Value Date   HGBA1C 5.6 01/27/2024   HGBA1C 5.3 01/19/2023     How often do you need to have someone help you when you read instructions, pamphlets, or other written materials from your doctor or pharmacy?: 1 - Never  Interpreter Needed?: No  Comments: lives with husband Information entered by :: B.Melodi Happel,LPN   Activities of Daily Living     04/07/2024  11:52 AM  In your present state of health, do you have any difficulty performing the following activities:  Hearing? 0   Vision? 0  Difficulty concentrating or making decisions? 0  Walking or climbing stairs? 0  Dressing or bathing? 0  Doing errands, shopping? 0  Preparing Food and eating ? N  Using the Toilet? N  In the past six months, have you accidently leaked urine? N  Do you have problems with loss of bowel control? N  Managing your Medications? N  Managing your Finances? N  Housekeeping or managing your Housekeeping? N    Patient Care Team: Tower, Laine LABOR, MD as PCP - General Georgina Shasta POUR, RN as Oncology Nurse Navigator Babara Call, MD as Consulting Physician (Oncology) Lenn Aran, MD as Consulting Physician (Radiation Oncology) Lane Shope, MD as Consulting Physician (General Surgery) [facility]  I have updated your Care Teams any recent Medical Services you may have received from other providers in the past year.     Assessment:   This is a routine wellness examination for Braley.  Hearing/Vision screen Hearing Screening - Comments:: Patient denies any hearing difficulties.   Vision Screening - Comments:: Pt says their vision is good with glasses Dr  [facility]ic[facility]   Goals Addressed               This Visit's Progress     COMPLETED: Patient Stated        Maintain weight       Patient Stated (pt-stated)        Continue strength training: lose body fat and increases stamina       Depression Screen     04/08/2024    8:19 AM 03/28/2024    8:21 AM 02/03/2024    9:32 AM 01/29/2023    8:42 AM 01/26/2023    9:07 AM 11/28/2022    8:35 AM 01/23/2021   10:34 AM  PHQ 2/9 Scores  PHQ - 2 Score 0 0 0 0 1 1 2   PHQ- 9 Score  0 4 0 3 5 8     Fall Risk     04/07/2024   11:52 AM 03/28/2024    8:21 AM 02/03/2024    9:32 AM 01/28/2023   10:48 AM 01/26/2023    9:07 AM  Fall Risk   Falls in the past year? 1 1 1  0 0  Comment hiking trip got taggled up in weeds      Number falls in past yr: 0 0 0  0  Injury with Fall? 1 1 1   0  Comment fractured some  fingers:went to ER      Risk for fall due to : No Fall Risks History of fall(s) History of fall(s) Impaired vision No Fall Risks  Follow up Education provided;Falls prevention discussed Falls evaluation completed Falls evaluation completed Falls prevention discussed Falls evaluation completed    MEDICARE RISK AT HOME:  Medicare Risk at Home Any stairs in or around the home?: (Patient-Rptd) Yes If so, are there any without handrails?: (Patient-Rptd) Yes Home free of loose throw rugs in walkways, pet beds, electrical cords, etc?: (Patient-Rptd) No Adequate lighting in your home to reduce risk of falls?: (Patient-Rptd) Yes Life alert?: (Patient-Rptd) No Use of a cane, walker or w/c?: (Patient-Rptd) No Grab bars in the bathroom?: (Patient-Rptd) No Shower chair or bench in shower?: (Patient-Rptd) No Elevated toilet seat or a handicapped toilet?: (Patient-Rptd) Yes  TIMED UP AND GO:  Was the  test performed?  No  Cognitive Function: 6CIT completed        04/08/2024    8:22 AM 01/29/2023    8:45 AM  6CIT Screen  What Year? 0 points 0 points  What month? 0 points 0 points  What time? 0 points 0 points  Count back from 20 0 points 0 points  Months in reverse 0 points 0 points  Repeat phrase 0 points 0 points  Total Score 0 points 0 points    Immunizations Immunization History  Administered Date(s) Administered   Fluad Quad(high Dose 65+) 02/21/2022   Influenza Inj Mdck Quad Pf 04/05/2020, 04/05/2021   Influenza Split 03/27/2011   Influenza Whole 06/23/2005, 03/24/2007, 04/02/2010   Influenza, Quadrivalent, Recombinant, Inj, Pf 03/23/2017   Influenza,inj,Quad PF,6+ Mos 04/15/2015, 03/30/2018, 01/28/2019   Influenza-Unspecified 03/23/2013   PFIZER Comirnaty(Gray Top)Covid-19 Tri-Sucrose Vaccine 09/28/2020   PFIZER(Purple Top)SARS-COV-2 Vaccination 09/19/2019, 10/12/2019   PNEUMOCOCCAL CONJUGATE-20 01/26/2023   Pneumococcal Polysaccharide-23 07/30/2018, 07/28/2021   Respiratory  Syncytial Virus Vaccine,Recomb Aduvanted(Arexvy) 05/21/2022   Td 04/15/2002, 01/24/2020   Tdap 05/21/2022   Zoster Recombinant(Shingrix) 01/28/2019, 04/25/2019    Screening Tests Health Maintenance  Topic Date Due   Hepatitis C Screening  Never done   Influenza Vaccine  09/20/2024 (Originally 01/22/2024)   COVID-19 Vaccine (4 - 2025-26 season) 04/13/2026 (Originally 02/22/2024)   Mammogram  02/09/2025   Medicare Annual Wellness (AWV)  04/08/2025   Colonoscopy  03/01/2026   DTaP/Tdap/Td (4 - Td or Tdap) 05/21/2032   Pneumococcal Vaccine: 50+ Years  Completed   DEXA SCAN  Completed   Zoster Vaccines- Shingrix  Completed   Meningococcal B Vaccine  Aged Out    Health Maintenance Items Addressed: Pt has appt 04/13/24 for Influenza vaccine  Additional Screening:  Vision Screening: Recommended annual ophthalmology exams for early detection of glaucoma and other disorders of the eye. Is the patient up to date with their annual eye exam?  Yes  Who is the provider or what is the name of the office in which the patient attends annual eye exams? Dr Renato  Dental Screening: Recommended annual dental exams for proper oral hygiene  Community Resource Referral / Chronic Care Management: CRR required this visit?  No   CCM required this visit?  Appt scheduled with PCP   Plan:    I have personally reviewed and noted the following in the patient's chart:   Medical and social history Use of alcohol , tobacco or illicit drugs  Current medications and supplements including opioid prescriptions. Patient is not currently taking opioid prescriptions. Functional ability and status Nutritional status Physical activity Advanced directives List of other physicians Hospitalizations, surgeries, and ER visits in previous 12 months Vitals Screenings to include cognitive, depression, and falls Referrals and appointments  In addition, I have reviewed and discussed with patient certain preventive  protocols, quality metrics, and best practice recommendations. A written personalized care plan for preventive services as well as general preventive health recommendations were provided to patient.   Erminio LITTIE Saris, LPN   [id number]   After Visit Summary: (MyChart) Due to this being a telephonic visit, the after visit summary with patients personalized plan was offered to patient via MyChart   Notes: Nothing significant to report at this time.

## 2024-04-08 NOTE — Patient Instructions (Signed)
 Michele Cruz,  Thank you for taking the time for your Medicare Wellness Visit. I appreciate your continued commitment to your health goals. Please review the care plan we discussed, and feel free to reach out if I can assist you further.  Medicare recommends these wellness visits once per year to help you and your care team stay ahead of potential health issues. These visits are designed to focus on prevention, allowing your provider to concentrate on managing your acute and chronic conditions during your regular appointments.  Please note that Annual Wellness Visits do not include a physical exam. Some assessments may be limited, especially if the visit was conducted virtually. If needed, we may recommend a separate in-person follow-up with your provider.  Ongoing Care Seeing your primary care provider every 3 to 6 months helps us  monitor your health and provide consistent, personalized care.   Referrals If a referral was made during today's visit and you haven't received any updates within two weeks, please contact the referred provider directly to check on the status.  Recommended Screenings:  Health Maintenance  Topic Date Due   Hepatitis C Screening  Never done   Flu Shot  09/20/2024*   COVID-19 Vaccine (4 - 2025-26 season) 04/13/2026*   Breast Cancer Screening  02/09/2025   Medicare Annual Wellness Visit  04/08/2025   Colon Cancer Screening  03/01/2026   DTaP/Tdap/Td vaccine (4 - Td or Tdap) 05/21/2032   Pneumococcal Vaccine for age over 63  Completed   DEXA scan (bone density measurement)  Completed   Zoster (Shingles) Vaccine  Completed   Meningitis B Vaccine  Aged Out  *Topic was postponed. The date shown is not the original due date.       03/25/2024    9:53 AM  Advanced Directives  Does Patient Have a Medical Advance Directive? Yes   Advance Care Planning is important because it: Ensures you receive medical care that aligns with your values, goals, and  preferences. Provides guidance to your family and loved ones, reducing the emotional burden of decision-making during critical moments.  Vision: Annual vision screenings are recommended for early detection of glaucoma, cataracts, and diabetic retinopathy. These exams can also reveal signs of chronic conditions such as diabetes and high blood pressure.  Dental: Annual dental screenings help detect early signs of oral cancer, gum disease, and other conditions linked to overall health, including heart disease and diabetes.

## 2024-04-21 ENCOUNTER — Other Ambulatory Visit: Payer: Self-pay | Admitting: Medical Genetics

## 2024-04-21 DIAGNOSIS — Z006 Encounter for examination for normal comparison and control in clinical research program: Secondary | ICD-10-CM

## 2024-05-27 ENCOUNTER — Inpatient Hospital Stay: Attending: Oncology

## 2024-05-27 ENCOUNTER — Encounter: Payer: Self-pay | Admitting: Oncology

## 2024-05-27 ENCOUNTER — Inpatient Hospital Stay: Admitting: Oncology

## 2024-05-27 VITALS — BP 132/71 | HR 72 | Temp 97.6°F | Resp 19 | Wt 213.5 lb

## 2024-05-27 DIAGNOSIS — Z7952 Long term (current) use of systemic steroids: Secondary | ICD-10-CM | POA: Diagnosis not present

## 2024-05-27 DIAGNOSIS — R232 Flushing: Secondary | ICD-10-CM | POA: Insufficient documentation

## 2024-05-27 DIAGNOSIS — R011 Cardiac murmur, unspecified: Secondary | ICD-10-CM | POA: Insufficient documentation

## 2024-05-27 DIAGNOSIS — Z923 Personal history of irradiation: Secondary | ICD-10-CM | POA: Insufficient documentation

## 2024-05-27 DIAGNOSIS — E785 Hyperlipidemia, unspecified: Secondary | ICD-10-CM | POA: Diagnosis not present

## 2024-05-27 DIAGNOSIS — M129 Arthropathy, unspecified: Secondary | ICD-10-CM | POA: Diagnosis not present

## 2024-05-27 DIAGNOSIS — Z79811 Long term (current) use of aromatase inhibitors: Secondary | ICD-10-CM | POA: Diagnosis not present

## 2024-05-27 DIAGNOSIS — Z170 Estrogen receptor positive status [ER+]: Secondary | ICD-10-CM | POA: Diagnosis not present

## 2024-05-27 DIAGNOSIS — D0511 Intraductal carcinoma in situ of right breast: Secondary | ICD-10-CM | POA: Insufficient documentation

## 2024-05-27 DIAGNOSIS — L732 Hidradenitis suppurativa: Secondary | ICD-10-CM | POA: Diagnosis not present

## 2024-05-27 DIAGNOSIS — Z8616 Personal history of COVID-19: Secondary | ICD-10-CM | POA: Diagnosis not present

## 2024-05-27 DIAGNOSIS — J45909 Unspecified asthma, uncomplicated: Secondary | ICD-10-CM | POA: Insufficient documentation

## 2024-05-27 DIAGNOSIS — Z8614 Personal history of Methicillin resistant Staphylococcus aureus infection: Secondary | ICD-10-CM | POA: Diagnosis not present

## 2024-05-27 LAB — CBC WITH DIFFERENTIAL (CANCER CENTER ONLY)
Abs Immature Granulocytes: 0.02 K/uL (ref 0.00–0.07)
Basophils Absolute: 0 K/uL (ref 0.0–0.1)
Basophils Relative: 1 %
Eosinophils Absolute: 0.2 K/uL (ref 0.0–0.5)
Eosinophils Relative: 3 %
HCT: 41.3 % (ref 36.0–46.0)
Hemoglobin: 14.1 g/dL (ref 12.0–15.0)
Immature Granulocytes: 0 %
Lymphocytes Relative: 32 %
Lymphs Abs: 1.7 K/uL (ref 0.7–4.0)
MCH: 30.1 pg (ref 26.0–34.0)
MCHC: 34.1 g/dL (ref 30.0–36.0)
MCV: 88.2 fL (ref 80.0–100.0)
Monocytes Absolute: 0.4 K/uL (ref 0.1–1.0)
Monocytes Relative: 8 %
Neutro Abs: 3 K/uL (ref 1.7–7.7)
Neutrophils Relative %: 56 %
Platelet Count: 186 K/uL (ref 150–400)
RBC: 4.68 MIL/uL (ref 3.87–5.11)
RDW: 14.5 % (ref 11.5–15.5)
WBC Count: 5.3 K/uL (ref 4.0–10.5)
nRBC: 0 % (ref 0.0–0.2)

## 2024-05-27 LAB — CMP (CANCER CENTER ONLY)
ALT: 28 U/L (ref 0–44)
AST: 37 U/L (ref 15–41)
Albumin: 4.3 g/dL (ref 3.5–5.0)
Alkaline Phosphatase: 81 U/L (ref 38–126)
Anion gap: 10 (ref 5–15)
BUN: 23 mg/dL (ref 8–23)
CO2: 28 mmol/L (ref 22–32)
Calcium: 10 mg/dL (ref 8.9–10.3)
Chloride: 104 mmol/L (ref 98–111)
Creatinine: 1.24 mg/dL — ABNORMAL HIGH (ref 0.44–1.00)
GFR, Estimated: 47 mL/min — ABNORMAL LOW (ref >60)
Glucose, Bld: 102 mg/dL — ABNORMAL HIGH (ref 70–99)
Potassium: 4.6 mmol/L (ref 3.5–5.1)
Sodium: 141 mmol/L (ref 135–145)
Total Bilirubin: 0.6 mg/dL (ref 0.0–1.2)
Total Protein: 6.7 g/dL (ref 6.5–8.1)

## 2024-05-27 NOTE — Assessment & Plan Note (Addendum)
 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 S/p adjuvant RT.  Declines genetic testing.  Continue Arimidex  1 mg daily.  Plan 5 years , till Feb 2029 Labs are reviewed and discussed with patient. - annual bilateral mammogram- obtain in August 2026. - annual MRI breast w wo contrast - Feb 2026

## 2024-05-27 NOTE — Assessment & Plan Note (Signed)
 Recommend calcium 1200mg  daily and Vitamin D supplementation.  10/13/2022, DEXA showed normal bone density.

## 2024-05-27 NOTE — Progress Notes (Signed)
 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 S/p adjuvant RT.  Declines genetic testing.  Continue Arimidex  1 mg daily.  Plan 5 years , till Feb 2029 Labs are reviewed and discussed with patient. - annual bilateral mammogram- obtain in August 2026. - annual MRI breast w wo contrast - Feb 2026  Aromatase inhibitor use Recommend calcium  1200mg  daily and Vitamin D supplementation.  10/13/2022, DEXA showed normal bone density.  Orders Placed This Encounter  Procedures   MR BREAST BILATERAL W WO CONTRAST INC CAD    Standing Status:   Future    Expected Date:   07/28/2024    Expiration Date:   05/27/2025    If indicated for the ordered procedure, I authorize the administration of contrast media per Radiology protocol:   Yes    What is the patient's sedation requirement?:   No Sedation    Does the patient have a pacemaker or implanted devices?:   No    Radiology Contrast Protocol - do NOT remove file path:   [email or web address]    Preferred imaging location?:   [facility] (table limit - 500lbs)   CBC with Differential (Cancer Center Only)    Standing Status:   Future    Expected Date:   11/25/2024    Expiration Date:   02/23/2025   CMP (Cancer Center only)    Standing Status:   Future    Expected Date:   11/25/2024    Expiration Date:   02/23/2025   Follow-up  6 months.  All questions were answered. The patient knows to call the clinic with any problems, questions or  concerns.  Call Babara, MD, PhD [facility] Hematology Oncology 05/27/2024       HISTORY OF PRESENTING ILLNESS:  Michele Cruz 68 y.o. female presents to establish care for right breast DCIS I have reviewed her chart and materials related to her cancer extensively and collaborated history with the patient. Summary of oncologic history is as follows: Oncology History  DCIS (ductal carcinoma in situ)  02/10/2022 Imaging   Bilateral screening mammogram showed further evaluation suggested for calcifications in the right breast.   02/20/2022 Mammogram   Unilateral right diagnostic mammogram showed  Round and punctate calcifications are identified in the lateral central right breast accounting for the screening findings. These calcifications span up to 6 mm and do not layer.   04/01/2022 Initial Diagnosis   DCIS (ductal carcinoma in situ)  03/28/2022 right breast biopsy DCIS, intemediate to high grade, solid and cribriform types with focal necrosis and extensive cancerization of lobules.  ER+100%, PR+50%   Menarche at age of 62 First live birth at age of 33 OCP use: 5 years Menopausal status: Postmenopausal History of HRT use: Denies History of chest radiation: Denies Number of previous breast biopsies: Denies  Patient denies any family history of breast cancer.   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  Negative for DCIS and invasive carcinoma.    Radiation Therapy     06/26/2022 - 07/25/2022 Radiation Therapy   S/p adjuvant breast radiation.    08/14/2022 -  Anti-estrogen oral therapy    Start on Arimidex      INTERVAL HISTORY Michele Cruz is a 68 y.o. female who has above history reviewed by me today presents for follow up visit for history of right breast DCIS, status post lumpectomy followed by radiation. Patient currently takes Arimidex  1 mg daily.    No new complaints. She tolerates well with manageable hot flash, joint/muscle pain.   MEDICAL HISTORY:  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   Family history of adverse reaction to anesthesia    sister PONV   Heart murmur    Heavy menses    Hemorrhoids    Hidradenitis suppurativa    History of exercise stress test 06/1999   neg   History of methicillin resistant staphylococcus aureus (MRSA) 2019   + surgical pcr swab   History of MRI 2003   neg per patient   History of MRI 2007   right quad fatty defect   History of MRI of spine 03/2004   C-5, C5-6 disk protrusion   Hyperlipidemia    LDL   Personal history of radiation therapy    Rosacea    Spondylosis    deg lumbar disc dz   Wisdom teeth removed    Zoster     SURGICAL HISTORY: Past Surgical History:  Procedure Laterality Date   BREAST BIOPSY Right 03/28/2022   breast center in [place]   BREAST CYST ASPIRATION  03/2002   BREAST CYST ASPIRATION  03/2004   left   BREAST LUMPECTOMY WITH RADIOFREQUENCY TAG IDENTIFICATION Right 05/07/2022   Procedure: BREAST LUMPECTOMY WITH RADIOFREQUENCY TAG IDENTIFICATION;  Surgeon: Lane Shope, MD;  Location: [facility] ORS;  Service: General;  Laterality: Right;   CATARACT EXTRACTION Bilateral    CESAREAN SECTION     COLONOSCOPY  01/26/2012   cyst removed     back of neck, august 2025   EYE SURGERY  7/12&26/2021   Cataracts   HEMORRHOID BANDING  08/2021    JOINT REPLACEMENT  03/15/2018 & 07/28/2018   TKR left & right   KNEE ARTHROPLASTY Left 03/15/2018   Procedure: COMPUTER ASSISTED TOTAL KNEE ARTHROPLASTY;  Surgeon: Mardee Lynwood SQUIBB, MD;  Location: [facility] ORS;  Service: Orthopedics;  Laterality: Left;   KNEE ARTHROPLASTY Right 07/28/2018   Procedure: COMPUTER ASSISTED TOTAL KNEE ARTHROPLASTY;  Surgeon: Mardee Lynwood SQUIBB, MD;  Location: [facility] ORS;  Service: Orthopedics;  Laterality: Right;   KNEE SURGERY Right     SOCIAL HISTORY: Social History   Socioeconomic History   Marital status: Married    Spouse name: Not on file   Number of children: 1   Years of education: Not on file   Highest education level: Bachelor's degree (e.g., BA, AB, BS)  Occupational History  Occupation: INSTRUCTIONAL ASST    Comment: Art Gallery Manager  Tobacco Use   Smoking status: Never    Passive exposure: Never   Smokeless tobacco: Never  Vaping Use   Vaping status: Never Used  Substance and Sexual Activity   Alcohol  use: Not Currently    Alcohol /week: 4.0 standard drinks of alcohol     Comment: 1-2 drinks/month   Drug use: No   Sexual activity: Not Currently    Birth control/protection: Post-menopausal  Other Topics Concern   Not on file  Social History Narrative   One daughter   Daily caffeine; use 1-2 daily   Social Drivers of Health   Financial Resource Strain: Low Risk  (04/07/2024)   Overall Financial Resource Strain (CARDIA)    Difficulty of Paying Living Expenses: Not hard at all  Food Insecurity: No Food Insecurity (04/07/2024)   Hunger Vital Sign    Worried About Running Out of Food in the Last Year: Never true    Ran Out of Food in the Last Year: Never true  Transportation Needs: No Transportation Needs (04/07/2024)   PRAPARE - Administrator, Civil Service (Medical): No    Lack of Transportation (Non-Medical): No  Physical Activity: Sufficiently Active (04/07/2024)   Exercise Vital Sign    Days of Exercise per Week: 3 days     Minutes of Exercise per Session: 90 min  Stress: No Stress Concern Present (04/07/2024)   [employer] of Occupational Health - Occupational Stress Questionnaire    Feeling of Stress: Only a little  Social Connections: Moderately Isolated (04/07/2024)   Social Connection and Isolation Panel    Frequency of Communication with Friends and Family: Three times a week    Frequency of Social Gatherings with Friends and Family: Once a week    Attends Religious Services: Patient declined    Database Administrator or Organizations: No    Attends Engineer, Structural: Not on file    Marital Status: Married  Catering Manager Violence: Not At Risk (04/08/2024)   Humiliation, Afraid, Rape, and Kick questionnaire    Fear of Current or Ex-Partner: No    Emotionally Abused: No    Physically Abused: No    Sexually Abused: No    FAMILY HISTORY: Family History  Problem Relation Age of Onset   Arthritis Mother        OA   Obesity Mother    Diverticulitis Mother    Colon polyps Mother    Hypertension Mother    Heart disease Father        CAD   Alcohol  abuse Father    Diabetes Father    Hyperlipidemia Sister    Diverticulitis Sister    Diabetes Other    Diverticulitis Sister    Diabetes Maternal Grandmother    Hyperlipidemia Paternal Grandmother    Asthma Sister    Hyperlipidemia Sister    Colon cancer Neg Hx    Breast cancer Neg Hx    Esophageal cancer Neg Hx    Rectal cancer Neg Hx    Stomach cancer Neg Hx     ALLERGIES:  is allergic to decadron  [dexamethasone ], crestor  [rosuvastatin ], depo-medrol  [methylprednisolone  acetate], lipitor [atorvastatin  calcium ], meloxicam, and simvastatin.  MEDICATIONS:  Current Outpatient Medications  Medication Sig Dispense Refill   anastrozole  (ARIMIDEX ) 1 MG tablet TAKE 1 TABLET EVERY DAY 90 tablet 3   Calcium  Carbonate (CALTRATE 600 PO) Take 1-2 capsules by mouth daily.     Cholecalciferol (VITAMIN D3) 25 MCG (  1000 UT) CAPS Take 1  capsule by mouth daily.     cyanocobalamin (VITAMIN B12) 1000 MCG tablet Take 1,000 mcg by mouth daily.     Flaxseed, Linseed, (FLAXSEED OIL PO) Take 1 tablet by mouth daily.     Loratadine  10 MG CAPS Take 1 capsule by mouth daily.     Magnesium  500 MG CAPS Take 1 capsule by mouth daily at 6 (six) AM.     Multiple Vitamins-Minerals (CENTRUM SILVER 50+WOMEN) TABS Take 1 capsule by mouth daily.     Niacinamide (VITAMIN B3 PO) Take 500 mg by mouth daily.     NONFORMULARY OR COMPOUNDED ITEM Azelaic Acid /Metronidazole/Ivermectin  cream pump     Omega-3 Fatty Acids (FISH OIL) 1200 MG CAPS Take 1 capsule by mouth daily.     Povidone (IVIZIA DRY EYES OP) Apply 1-2 drops to eye in the morning and at bedtime.     TURMERIC CURCUMIN PO Take 30 mg by mouth daily.     vitamin E 200 UNIT capsule Take 200 Units by mouth daily. (Patient taking differently: Take 400 Units by mouth daily.)     doxycycline  (VIBRA -TABS) 100 MG tablet Take 1 tablet (100 mg total) by mouth 2 (two) times daily. (Patient not taking: Reported on 04/08/2024) 20 tablet 0   predniSONE  (DELTASONE ) 10 MG tablet Take 4 pills once daily by mouth for 3 days, then 3 pills daily for 3 days, then 2 pills daily for 3 days then 1 pill daily for 3 days then stop 30 tablet 0   No current facility-administered medications for this visit.    Review of Systems  Constitutional:  Negative for appetite change, chills, fatigue and fever.  HENT:   Negative for hearing loss and voice change.   Eyes:  Negative for eye problems.  Respiratory:  Negative for chest tightness and cough.   Cardiovascular:  Negative for chest pain.  Gastrointestinal:  Negative for abdominal distention, abdominal pain and blood in stool.  Endocrine: Positive for hot flashes.  Genitourinary:  Negative for difficulty urinating and frequency.   Musculoskeletal:  Positive for arthralgias.  Skin:  Negative for itching and rash.  Neurological:  Negative for extremity weakness.   Hematological:  Negative for adenopathy.  Psychiatric/Behavioral:  Negative for confusion.      PHYSICAL EXAMINATION: ECOG PERFORMANCE STATUS: 0 - Asymptomatic  Vitals:   05/27/24 1159  BP: 132/71  Pulse: 72  Resp: 19  Temp: 97.6 F (36.4 C)  SpO2: 99%   Filed Weights   05/27/24 1159  Weight: 213 lb 8 oz (96.8 kg)    Physical Exam Constitutional:      General: She is not in acute distress.    Appearance: She is not diaphoretic.  HENT:     Head: Normocephalic and atraumatic.     Mouth/Throat:     Pharynx: No oropharyngeal exudate.  Eyes:     General: No scleral icterus. Cardiovascular:     Rate and Rhythm: Normal rate.  Pulmonary:     Effort: Pulmonary effort is normal. No respiratory distress.  Abdominal:     General: There is no distension.     Palpations: Abdomen is soft.  Musculoskeletal:        General: Normal range of motion.     Cervical back: Normal range of motion and neck supple.  Skin:    Findings: No erythema.  Neurological:     Mental Status: She is alert and oriented to person, place, and time. Mental status is at  baseline.     Cranial Nerves: No cranial nerve deficit.     Motor: No abnormal muscle tone.  Psychiatric:        Mood and Affect: Mood and affect normal.    LABORATORY DATA:  I have reviewed the data as listed    Latest Ref Rng & Units 05/27/2024   11:41 AM 03/25/2024   10:28 AM 11/13/2023   11:36 AM  CBC  WBC 4.0 - 10.5 K/uL 5.3  6.2  6.2   Hemoglobin 12.0 - 15.0 g/dL 85.8  85.7  86.4   Hematocrit 36.0 - 46.0 % 41.3  43.0  40.5   Platelets 150 - 400 K/uL 186  136  206       Latest Ref Rng & Units 05/27/2024   11:41 AM 03/25/2024   10:28 AM 01/27/2024    8:15 AM  CMP  Glucose 70 - 99 mg/dL 897  895  89   BUN 8 - 23 mg/dL 23  14  17    Creatinine 0.44 - 1.00 mg/dL 8.75  9.23  9.10   Sodium 135 - 145 mmol/L 141  134  141   Potassium 3.5 - 5.1 mmol/L 4.6  3.8  4.4   Chloride 98 - 111 mmol/L 104  99  105   CO2 22 - 32 mmol/L 28   23  29    Calcium  8.9 - 10.3 mg/dL 89.9  8.8  9.4   Total Protein 6.5 - 8.1 g/dL 6.7  6.5  6.3   Total Bilirubin 0.0 - 1.2 mg/dL 0.6  1.1  0.7   Alkaline Phos 38 - 126 U/L 81  56  77   AST 15 - 41 U/L 37  27  22   ALT 0 - 44 U/L 28  18  14       RADIOGRAPHIC STUDIES: I have personally reviewed the radiological images as listed and agreed with the findings in the report. No results found.

## 2024-06-01 DIAGNOSIS — D2261 Melanocytic nevi of right upper limb, including shoulder: Secondary | ICD-10-CM | POA: Diagnosis not present

## 2024-06-01 DIAGNOSIS — L821 Other seborrheic keratosis: Secondary | ICD-10-CM | POA: Diagnosis not present

## 2024-06-01 DIAGNOSIS — D225 Melanocytic nevi of trunk: Secondary | ICD-10-CM | POA: Diagnosis not present

## 2024-06-01 DIAGNOSIS — D2271 Melanocytic nevi of right lower limb, including hip: Secondary | ICD-10-CM | POA: Diagnosis not present

## 2024-06-01 DIAGNOSIS — D2272 Melanocytic nevi of left lower limb, including hip: Secondary | ICD-10-CM | POA: Diagnosis not present

## 2024-06-01 DIAGNOSIS — D2262 Melanocytic nevi of left upper limb, including shoulder: Secondary | ICD-10-CM | POA: Diagnosis not present

## 2024-06-01 DIAGNOSIS — L719 Rosacea, unspecified: Secondary | ICD-10-CM | POA: Diagnosis not present

## 2024-07-29 ENCOUNTER — Ambulatory Visit: Admission: RE | Admit: 2024-07-29 | Source: Ambulatory Visit

## 2024-07-29 DIAGNOSIS — D0511 Intraductal carcinoma in situ of right breast: Secondary | ICD-10-CM

## 2024-07-29 MED ORDER — GADOBUTROL 1 MMOL/ML IV SOLN
10.0000 mL | Freq: Once | INTRAVENOUS | Status: AC | PRN
  Administered 2024-07-29: 10 mL via INTRAVENOUS

## 2024-09-19 ENCOUNTER — Ambulatory Visit: Admitting: Radiation Oncology

## 2024-11-29 ENCOUNTER — Inpatient Hospital Stay

## 2024-11-29 ENCOUNTER — Inpatient Hospital Stay: Admitting: Oncology

## 2025-02-08 ENCOUNTER — Encounter: Admitting: Family Medicine

## 2025-02-15 ENCOUNTER — Encounter: Admitting: Family Medicine

## 2025-04-14 ENCOUNTER — Ambulatory Visit
